# Patient Record
Sex: Male | Born: 1935 | Race: White | Hispanic: No | Smoking: Former smoker
Health system: Southern US, Community
[De-identification: ages and names within clinical notes are randomized; demographics above are authoritative.]

## PROBLEM LIST (undated history)

## (undated) DIAGNOSIS — H269 Unspecified cataract: Secondary | ICD-10-CM

## (undated) DIAGNOSIS — F0390 Unspecified dementia without behavioral disturbance: Secondary | ICD-10-CM

## (undated) DIAGNOSIS — F419 Anxiety disorder, unspecified: Secondary | ICD-10-CM

## (undated) DIAGNOSIS — K8020 Calculus of gallbladder without cholecystitis without obstruction: Secondary | ICD-10-CM

## (undated) DIAGNOSIS — I951 Orthostatic hypotension: Secondary | ICD-10-CM

## (undated) DIAGNOSIS — I2510 Atherosclerotic heart disease of native coronary artery without angina pectoris: Secondary | ICD-10-CM

## (undated) DIAGNOSIS — K219 Gastro-esophageal reflux disease without esophagitis: Secondary | ICD-10-CM

## (undated) DIAGNOSIS — I10 Essential (primary) hypertension: Secondary | ICD-10-CM

## (undated) DIAGNOSIS — K5120 Ulcerative (chronic) proctitis without complications: Secondary | ICD-10-CM

## (undated) DIAGNOSIS — R1319 Other dysphagia: Secondary | ICD-10-CM

## (undated) DIAGNOSIS — F329 Major depressive disorder, single episode, unspecified: Secondary | ICD-10-CM

## (undated) DIAGNOSIS — D696 Thrombocytopenia, unspecified: Secondary | ICD-10-CM

## (undated) DIAGNOSIS — E785 Hyperlipidemia, unspecified: Secondary | ICD-10-CM

## (undated) DIAGNOSIS — L409 Psoriasis, unspecified: Secondary | ICD-10-CM

## (undated) DIAGNOSIS — E039 Hypothyroidism, unspecified: Secondary | ICD-10-CM

## (undated) DIAGNOSIS — R1310 Dysphagia, unspecified: Secondary | ICD-10-CM

## (undated) DIAGNOSIS — N182 Chronic kidney disease, stage 2 (mild): Secondary | ICD-10-CM

## (undated) DIAGNOSIS — G3184 Mild cognitive impairment, so stated: Secondary | ICD-10-CM

## (undated) DIAGNOSIS — I219 Acute myocardial infarction, unspecified: Secondary | ICD-10-CM

## (undated) DIAGNOSIS — F32A Depression, unspecified: Secondary | ICD-10-CM

## (undated) DIAGNOSIS — M1990 Unspecified osteoarthritis, unspecified site: Secondary | ICD-10-CM

## (undated) DIAGNOSIS — D649 Anemia, unspecified: Secondary | ICD-10-CM

## (undated) HISTORY — DX: Depression, unspecified: F32.A

## (undated) HISTORY — PX: ESOPHAGEAL DILATION: SHX303

## (undated) HISTORY — PX: HERNIA REPAIR: SHX51

## (undated) HISTORY — DX: Hyperlipidemia, unspecified: E78.5

## (undated) HISTORY — DX: Anemia, unspecified: D64.9

## (undated) HISTORY — DX: Major depressive disorder, single episode, unspecified: F32.9

## (undated) HISTORY — DX: Chronic kidney disease, stage 2 (mild): N18.2

## (undated) HISTORY — DX: Mild cognitive impairment of uncertain or unknown etiology: G31.84

## (undated) HISTORY — DX: Gastro-esophageal reflux disease without esophagitis: K21.9

## (undated) HISTORY — DX: Unspecified cataract: H26.9

## (undated) HISTORY — DX: Anxiety disorder, unspecified: F41.9

## (undated) HISTORY — DX: Unspecified osteoarthritis, unspecified site: M19.90

## (undated) HISTORY — DX: Psoriasis, unspecified: L40.9

## (undated) HISTORY — DX: Acute myocardial infarction, unspecified: I21.9

## (undated) HISTORY — DX: Hypothyroidism, unspecified: E03.9

## (undated) HISTORY — DX: Thrombocytopenia, unspecified: D69.6

## (undated) HISTORY — DX: Ulcerative (chronic) proctitis without complications: K51.20

## (undated) HISTORY — PX: CLEFT LIP REPAIR: SUR1164

## (undated) HISTORY — DX: Essential (primary) hypertension: I10

## (undated) HISTORY — DX: Other dysphagia: R13.19

## (undated) HISTORY — DX: Dysphagia, unspecified: R13.10

## (undated) HISTORY — DX: Atherosclerotic heart disease of native coronary artery without angina pectoris: I25.10

## (undated) HISTORY — DX: Unspecified dementia, unspecified severity, without behavioral disturbance, psychotic disturbance, mood disturbance, and anxiety: F03.90

## (undated) HISTORY — DX: Orthostatic hypotension: I95.1

---
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
## 1977-01-11 HISTORY — PX: NECK MASS EXCISION: SHX2079

## 1996-08-11 DIAGNOSIS — I219 Acute myocardial infarction, unspecified: Secondary | ICD-10-CM

## 1996-08-11 HISTORY — DX: Acute myocardial infarction, unspecified: I21.9

## 1998-11-19 ENCOUNTER — Ambulatory Visit (HOSPITAL_COMMUNITY): Admission: RE | Admit: 1998-11-19 | Discharge: 1998-11-19 | Payer: Self-pay | Admitting: Gastroenterology

## 1998-11-19 ENCOUNTER — Encounter (INDEPENDENT_AMBULATORY_CARE_PROVIDER_SITE_OTHER): Payer: Self-pay | Admitting: Specialist

## 2002-01-11 HISTORY — PX: OTHER SURGICAL HISTORY: SHX169

## 2002-01-11 HISTORY — PX: COLECTOMY: SHX59

## 2007-06-12 HISTORY — PX: COLONOSCOPY: SHX174

## 2007-06-12 HISTORY — PX: ESOPHAGOGASTRODUODENOSCOPY: SHX1529

## 2007-06-23 LAB — HM COLONOSCOPY

## 2007-07-24 ENCOUNTER — Ambulatory Visit: Payer: Self-pay | Admitting: Oncology

## 2009-09-11 ENCOUNTER — Ambulatory Visit: Payer: Self-pay | Admitting: Cardiology

## 2009-09-11 ENCOUNTER — Encounter (INDEPENDENT_AMBULATORY_CARE_PROVIDER_SITE_OTHER): Payer: Self-pay | Admitting: *Deleted

## 2009-09-11 DIAGNOSIS — E785 Hyperlipidemia, unspecified: Secondary | ICD-10-CM | POA: Insufficient documentation

## 2009-09-11 DIAGNOSIS — K5730 Diverticulosis of large intestine without perforation or abscess without bleeding: Secondary | ICD-10-CM | POA: Insufficient documentation

## 2009-09-11 DIAGNOSIS — K222 Esophageal obstruction: Secondary | ICD-10-CM

## 2009-09-11 DIAGNOSIS — I739 Peripheral vascular disease, unspecified: Secondary | ICD-10-CM

## 2009-10-15 DIAGNOSIS — L409 Psoriasis, unspecified: Secondary | ICD-10-CM

## 2009-10-15 DIAGNOSIS — E039 Hypothyroidism, unspecified: Secondary | ICD-10-CM

## 2010-02-08 LAB — CONVERTED CEMR LAB
CO2: 31 meq/L
Calcium: 9.7 mg/dL
Chloride: 102 meq/L
Glucose, Bld: 121 mg/dL
HDL: 38 mg/dL — ABNORMAL LOW (ref >39)
LDL Cholesterol: 67 mg/dL (ref 0–99)
Platelets: 89 10*3/uL
Potassium: 4.9 meq/L
Sodium: 139 meq/L
Total CHOL/HDL Ratio: 3.1
Triglycerides: 61 mg/dL (ref <150)
VLDL: 12 mg/dL (ref 0–40)
WBC: 4.8 10*3/uL

## 2010-02-10 NOTE — Assessment & Plan Note (Signed)
Summary: ***NP6 CAD   Visit Type:  Initial Consult Primary Provider:  Dr.Halm   History of Present Illness: [name] is seen for an initial visit at the kind request of Dr. Milford [name] to assume care for known coronary artery disease.  This nice gentleman suffered an acute myocardial infarction approximately 12 years ago for which he was transported by helicopter from [facility] to [facility].  He does not recall any intervention for coronary disease at that time although he did undergo cardiac catheterization.  He had a fairly prolonged hospitalization of 7-10 days.  Records have been requested but not yet supplied by the hospitals involved.  He was subsequently followed by Dr. Alonza [name] in [place], who he has not seen for a few years.  He has no history of hypertension, hyperlipidemia or diabetes.  He has not used tobacco products except very briefly as a teenager.  Current Medications (verified): 1)  Lotensin 10 Mg Tabs (Benazepril Hcl) 2)  Synthroid 125 Mcg Tabs (Levothyroxine Sodium) .... Take 1 Tab Daily 3)  Atenolol 50 Mg Tabs (Atenolol) .... Take  1 Tab Daily 4)  Aspir-Low 81 Mg Tbec (Aspirin) .... Take 1 Tab Daily 5)  Niacin 500 Mg Tabs (Niacin) .... Take 4 Tabs Nightly 6)  Lipitor 10 Mg Tabs (Atorvastatin Calcium) .... Take 1 Tab Daily 7)  Asacol 400 Mg Tbec (Mesalamine) .... Take 1 Tab Daily 8)  Nitro-Dur 0.4 Mg/hr Pt24 (Nitroglycerin) .... Use As Directe Dfor Chestpain Not To Exceed 3 Tabs Before Going To Ed 9)  Ranitidine Hcl 150 Mg Caps (Ranitidine Hcl) .... Take 1 Tab Daily  Allergies (verified): No Known Drug Allergies  Past History:  Family History: Last updated: 09-22-09 Father died at age 65 due to neoplastic disease Mother died at age 22 following myocardial infarction Brother deceased-causes unknown  Social History: Last updated: 2009/09/22 Married with 2 adult children and lives in [place] Retired  from work with [employer] Tobacco Use -  Minimal at age 19  Alcohol Use - no Regular Exercise - no Drug Use - no  Past Medical History: ASCVD: acute myocardial infarction in 1999 treated at [facility] Hypothyroidism Anemia and thrombocytopenia Dysphasia secondary to Schatzki's ring with dilatation x2, most recently in 6/09; negative for H. 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- 2004 Colonoscopy 2009-few diverticula; normal anastomosis Appendectomy-1940 Herniorrhaphy  Family History: Father died at age 70 due to neoplastic disease Mother died at age 43 following myocardial infarction Brother deceased-causes unknown  Social History: Married with 2 adult children and lives in [place] Retired  from work with [employer] risk [employer] Tobacco Use - Minimal at age 11  Alcohol Use - no Regular Exercise - no Drug Use - no  Review of Systems       Requires corrective lenses; has been told of cataract on the right, but surgery has not been required; upper dentures; history of heart murmur as child; arthritic discomfort in the elbows.  All other systems reviewed and are negative.  Vital Signs:  Patient profile:   77 year old male Height:      71 inches Weight:      164 pounds BMI:     22.96 Pulse rate:   73 / minute BP sitting:   113 / 73  (right arm)  Vitals Entered By: Raymond Saa, CNA 09-22-09 1:13 PM)  Physical Exam  General:  Proportionate weight and height; well-developed; no acute distress: HEENT-/AT; PERRL; EOM intact; conjunctiva and lids nl:  Neck-No JVD; no carotid bruits: Endocrine-No thyromegaly: Lungs-No tachypnea, clear without rales, rhonchi or wheezes: CV-normal PMI; normal S1 and S2; modest systolic ejection murmur Abdomen-BS normal; soft and non-tender without masses or organomegaly: MS-No deformities, cyanosis or clubbing: Neurologic-Nl  cranial nerves; symmetric strength and tone: Skin- Warm, no sig. lesions: Extremities-Nl distal pulses; no edema    Impression & Recommendations:  Problem # 1:  ATHEROSCLEROTIC CARDIOVASCULAR DISEASE (ICD-429.2) Patient has been asymptomatic since a cardiac event 12 years ago.  Records will be obtained from [facility] and from the patient's cardiologist and reviewed.  I anticipate that he has preserved left ventricular systolic function and doubt that further testing will be necessary at present in the absence of symptoms.  Continued treatment with an ACE inhibitor, a beta blocker, aspirin and effective lipid-lowering therapy is appropriate.  Problem # 2:  HYPERLIPIDEMIA (ICD-272.4) Lipid profile will be obtained and therapy adjusted.  In the absence of unexpected information in his medical records, I will plan to see this patient again in one year.  Other Orders: T-Lipid Profile [phone])  Patient Instructions: 1)  Your physician recommends that you schedule a follow-up appointment in: 1 year 2)  Your physician recommends that you return for lab work in: tomorrow  EKG  Procedure date:  09/11/2009  Findings:      Normal sinus rhythm at a rate of 60 bpm Left atrial abnormality Left axis deviation Cannot exclude previous inferior myocardial infarction Cannot exclude previous anterior myocardial infarction No previous tracing for comparison.  CXR  Procedure date:  07/17/2009  Findings:      Normal heart size Clear lung fields except for the presence of coarsened interstitial changes suggestive of chronic obstructive pulmonary disease.   -  Date:  07/15/2009    BG Random: 121    BUN: 17    Creatinine: 1.02    Sodium: 139    Potassium: 4.9    Chloride: 102    CO2 Total: 31    Calcium: 9.7    WBC: 4.8    HGB: 12.2    HCT: 35.5    PLT: 89    MCV: 100.6  Appended Document: ***NP6 CAD    Clinical Lists Changes  Problems: Added new problem of HYPOTHYROIDISM  (ICD-244.9) Added new problem of PSORIASIS (ICD-696.1) Observations: Added new observation of PAST MED HX: ASCVD: acute inferior MI 1998 treated at [facility] with thrombolysis.  Cath-->high grade distal LAD;       critical lesion of OM1; mild to moderate disease in other coronaries.  GXT Echo-nl EF; distal inferoapical HK,       somewhat worse with exercise to 11 mets. Hypothyroidism Hyperlipidemia Anemia and thrombocytopenia Dysphasia secondary to Schatzki's ring with dilatation x2, most recently in 6/09; negative for H. pylori Psoriasis DJD  (10/15/2009 21:37) Added new observation of CARDIO HPI: Records oftained from [facility] in the form of a single clinic note.  Inferior MI occurred in 8/98 and was treated with thrombolytics.  Cath->high grade distal LAD; critical lesion in branch of OM1; mild to moderate disease in other coronaries.  GXT-negative (10/15/2009 21:37) Added new observation of PRIMARY MD: Dr.Halm (10/15/2009 21:37)       Primary Provider:  Dr.Halm   History of Present Illness: Records oftained from [facility] in the form of a single clinic note.  Inferior MI occurred in 8/98 and was treated with thrombolytics.  Cath->high  grade distal LAD; critical lesion in branch of OM1; mild to moderate disease in other coronaries.  GXT-negative   Past History:  Past Medical History: ASCVD: acute inferior MI 1998 treated at [facility] with thrombolysis.  Cath-->high grade distal LAD;       critical lesion of OM1; mild to moderate disease in other coronaries.  GXT Echo-nl EF; distal inferoapical HK,       somewhat worse with exercise to 11 mets. Hypothyroidism Hyperlipidemia Anemia and thrombocytopenia Dysphasia secondary to Schatzki's ring with dilatation x2, most recently in 6/09; negative for H. pylori Psoriasis DJD

## 2010-02-10 NOTE — Letter (Signed)
Summary: [facility], [facility] at [employer]  [address] [place] [address]   Phone: [phone]  Fax: [phone]     September 11, 2009 MRN: [id number]   [facility] [place] [address] [place], [place]  [address]      YOUR LAB WORK IS DUE   TOMORROW Please go to [facility], located across the street from [facility] on the second floor.  Hours are Monday - Friday 7am until 7:30pm         Saturday 8am until 12noon    _X_  DO NOT EAT OR DRINK AFTER MIDNIGHT EVENING PRIOR TO LABWORK  __ YOUR LABWORK IS NOT FASTING --YOU MAY EAT PRIOR TO LABWORK

## 2010-12-30 ENCOUNTER — Encounter: Payer: Self-pay | Admitting: Cardiology

## 2011-03-08 DIAGNOSIS — H11819 Pseudopterygium of conjunctiva, unspecified eye: Secondary | ICD-10-CM | POA: Diagnosis not present

## 2011-03-08 DIAGNOSIS — Z961 Presence of intraocular lens: Secondary | ICD-10-CM | POA: Diagnosis not present

## 2011-03-08 DIAGNOSIS — H40019 Open angle with borderline findings, low risk, unspecified eye: Secondary | ICD-10-CM | POA: Diagnosis not present

## 2011-04-08 DIAGNOSIS — E039 Hypothyroidism, unspecified: Secondary | ICD-10-CM | POA: Diagnosis not present

## 2011-04-08 DIAGNOSIS — R350 Frequency of micturition: Secondary | ICD-10-CM | POA: Diagnosis not present

## 2011-04-08 DIAGNOSIS — I10 Essential (primary) hypertension: Secondary | ICD-10-CM | POA: Diagnosis not present

## 2011-04-08 DIAGNOSIS — E785 Hyperlipidemia, unspecified: Secondary | ICD-10-CM | POA: Diagnosis not present

## 2011-06-11 DIAGNOSIS — E785 Hyperlipidemia, unspecified: Secondary | ICD-10-CM | POA: Diagnosis not present

## 2011-06-11 DIAGNOSIS — I10 Essential (primary) hypertension: Secondary | ICD-10-CM | POA: Diagnosis not present

## 2011-09-23 DIAGNOSIS — E039 Hypothyroidism, unspecified: Secondary | ICD-10-CM | POA: Diagnosis not present

## 2011-09-23 DIAGNOSIS — I10 Essential (primary) hypertension: Secondary | ICD-10-CM | POA: Diagnosis not present

## 2011-09-23 DIAGNOSIS — E785 Hyperlipidemia, unspecified: Secondary | ICD-10-CM | POA: Diagnosis not present

## 2011-09-23 DIAGNOSIS — Z23 Encounter for immunization: Secondary | ICD-10-CM | POA: Diagnosis not present

## 2011-12-01 ENCOUNTER — Ambulatory Visit: Payer: Self-pay | Admitting: Family Medicine

## 2011-12-03 ENCOUNTER — Encounter: Payer: Self-pay | Admitting: Family Medicine

## 2011-12-03 ENCOUNTER — Ambulatory Visit (INDEPENDENT_AMBULATORY_CARE_PROVIDER_SITE_OTHER): Payer: Medicare Other | Admitting: Family Medicine

## 2011-12-03 VITALS — BP 109/69 | HR 71 | Temp 98.0°F | Ht 68.5 in | Wt 161.1 lb

## 2011-12-03 DIAGNOSIS — Z7689 Persons encountering health services in other specified circumstances: Secondary | ICD-10-CM

## 2011-12-03 DIAGNOSIS — Z23 Encounter for immunization: Secondary | ICD-10-CM

## 2011-12-03 DIAGNOSIS — Z7189 Other specified counseling: Secondary | ICD-10-CM | POA: Diagnosis not present

## 2011-12-03 DIAGNOSIS — E039 Hypothyroidism, unspecified: Secondary | ICD-10-CM

## 2011-12-03 DIAGNOSIS — I219 Acute myocardial infarction, unspecified: Secondary | ICD-10-CM | POA: Insufficient documentation

## 2011-12-03 DIAGNOSIS — I2510 Atherosclerotic heart disease of native coronary artery without angina pectoris: Secondary | ICD-10-CM

## 2011-12-03 DIAGNOSIS — E785 Hyperlipidemia, unspecified: Secondary | ICD-10-CM | POA: Diagnosis not present

## 2011-12-03 MED ORDER — NIACIN ER (ANTIHYPERLIPIDEMIC) 1000 MG PO TBCR: 1000.0000 mg | EXTENDED_RELEASE_TABLET | Freq: Every day | ORAL | Status: DC

## 2011-12-03 NOTE — Assessment & Plan Note (Signed)
Asymptomatic. Continue current meds

## 2011-12-03 NOTE — Assessment & Plan Note (Signed)
Pt reports that labs were done in the last couple of months at prior PCP's office--will get these records. Plan to recheck TSH at f/u in 4 mo.

## 2011-12-03 NOTE — Progress Notes (Signed)
Office Note 12/03/2011  CC:  Chief Complaint  Patient presents with  . Establish Care    new patient    HPI:  Raymond [name] is a 78 y.o. White male who is here to establish care. Patient's most recent primary MD: Dr. Milford Cage at [facility] in [place]. Old records in EPIC/HL were reviewed prior to or during today's visit.  Here to get established, has no acute complaints or needs except he asks about whether or not he should continue niaspan.  He says he has been on it for about 2 yrs at the 2000 mg qhs dose.  Denies side effect. He is not sure if his lipid panel has improved on it.  He just says he is interested in being on fewer pills--says this one costs him the most.  Past Medical History  Diagnosis Date  . ASCVD (arteriosclerotic cardiovascular disease)   . Hypothyroidism   . Hyperlipidemia   . Anemia   . Thrombocytopenia   . Dysphasia     Secondary to Schatzki's ring with dilation x2, most recently in 06/09, negative for H. pylori  . Psoriasis   . DJD (degenerative joint disease)   . Chicken pox as a child  . Measles as a child  . Mumps   . Heart attack 08-1996    Past Surgical History  Procedure Date  . Cleft lip repair     As a child  . Neck mass excision 1979  . Colectomy 2004    Colnovesicular fistula secondary to diverticulitis requiring sigmoid colectomy and bladder repair  . Colonoscopy 2009    Few diverticula, normal anastomosis  . Appendectomy 1940  . Hernia repair   . Colovesicular fistula 2004  . Esophageal dilation 06-23-07    Family History  Problem Relation Age of Onset  . Heart disease Daughter     heart defect: d age 39  . Cancer Father     pt doesn't remember type    History   Social History  . Marital Status: Widowed    Spouse Name: N/A    Number of Children: N/A  . Years of Education: N/A   Occupational History  . Retired from [employer]    Social History Main Topics  . Smoking status: Former Smoker -- 1.0 packs/day for 10 years      Types: Cigarettes    Quit date: 01/11/1970  . Smokeless tobacco: Never Used     Comment: Minimal use at 20  . Alcohol Use: No  . Drug Use: No  . Sexually Active: Not Currently   Other Topics Concern  . Not on file   Social History Narrative   Widowed, has had girlfriend x 30 yrs, has 1 son.  Lives in [place], son lives in [place].Occupation: [employer] in [place].  Retired 52s.No regular exercise.  Smoked in the WAY distant past.Alcohol: none.      Outpatient Encounter Prescriptions as of 12/03/2011  Medication Sig Dispense Refill  . aspirin 81 MG tablet Take 81 mg by mouth daily.        Marland Kitchen atenolol (TENORMIN) 50 MG tablet Take 50 mg by mouth daily.        Marland Kitchen atorvastatin (LIPITOR) 10 MG tablet Take 10 mg by mouth daily.        . benazepril (LOTENSIN) 10 MG tablet Take 10 mg by mouth.        . levothyroxine (SYNTHROID, LEVOTHROID) 125 MCG tablet Take 125 mcg by mouth daily.        Marland Kitchen  mesalamine (ASACOL) 400 MG EC tablet Take 400 mg by mouth daily.        . ranitidine (ZANTAC) 150 MG capsule Take 150 mg by mouth 2 (two) times daily.       . [DISCONTINUED] niacin (NIASPAN) 500 MG CR tablet Take 2,000 mg by mouth at bedtime.        . niacin (NIASPAN) 1000 MG CR tablet Take 1 tablet (1,000 mg total) by mouth at bedtime.  30 tablet  6  . nitroGLYCERIN (NITRODUR - DOSED IN MG/24 HR) 0.4 mg/hr Place 1 patch onto the skin as directed.        Niaspan dose above is not accurate: he came to today's visit taking 2000 mg niaspan qhs  No Known Allergies  ROS Review of Systems  Constitutional: Negative for fever and fatigue.  HENT: Negative for congestion and sore throat.   Eyes: Negative for visual disturbance.  Respiratory: Negative for cough.   Cardiovascular: Negative for chest pain.  Gastrointestinal: Negative for nausea and abdominal pain.  Genitourinary: Negative for dysuria.  Musculoskeletal: Negative for back pain and joint swelling.  Skin: Negative for rash.   Neurological: Negative for weakness and headaches.  Hematological: Negative for adenopathy.    PE; Blood pressure 109/69, pulse 71, temperature 98 F (36.7 C), temperature source Temporal, height 5' 8.5" (1.74 m), weight 161 lb 1.9 oz (73.084 kg), SpO2 95.00%. Gen: Alert, well appearing.  Patient is oriented to person, place, time, and situation. AFFECT: pleasant, lucid thought and speech. ENT: Ears: EACs clear, normal epithelium.  TMs with good light reflex and landmarks bilaterally.  Eyes: no injection, icteris, swelling, or exudate.  EOMI, PERRLA. Nose: no drainage or turbinate edema/swelling.  No injection or focal lesion.  Mouth: lips without lesion/swelling.  Oral mucosa pink and moist.  Dentition intact and without obvious caries or gingival swelling.  Oropharynx without erythema, exudate, or swelling.  Neck - No masses or thyromegaly or limitation in range of motion CV: RRR, no m/r/g.   LUNGS: CTA bilat, nonlabored resps, good aeration in all lung fields. ABD: soft, NT, ND, BS normal.  No hepatospenomegaly or mass.  No bruits. EXT: 2+ LE edema, with chronic venous stasis skin changes  Pertinent labs:  None today  ASSESSMENT AND PLAN:   New Pt, here to establish care.  Reviewed EMR records.  Will request Dr. Webb Laws records.  HYPOTHYROIDISM Pt reports that labs were done in the last couple of months at prior PCP's office--will get these records. Plan to recheck TSH at f/u in 4 mo.  HYPERLIPIDEMIA Continue lipitor, but will go ahead and cut back his niaspan to the 1000mg  qhs dosing, new rx and some samples of the 500mg  niaspan given today.   Plan to recheck lipids at next f/u in 45mo.  ATHEROSCLEROTIC CARDIOVASCULAR DISEASE Asymptomatic. Continue current meds.   An After Visit Summary was printed and given to the patient.  Return in about 4 months (around 04/01/2012) for f/u hyperlipidemia and hypothyroid and CAD.

## 2011-12-03 NOTE — Assessment & Plan Note (Signed)
Continue lipitor, but will go ahead and cut back his niaspan to the 1000mg  qhs dosing, new rx and some samples of the 500mg  niaspan given today.   Plan to recheck lipids at next f/u in 78mo.

## 2012-03-21 ENCOUNTER — Telehealth: Payer: Self-pay | Admitting: *Deleted

## 2012-03-21 NOTE — Telephone Encounter (Signed)
Faxed request received for prior auth for DELZICOL 400 MG, 2 CAP BID From will be faxed to office.

## 2012-03-22 NOTE — Telephone Encounter (Signed)
Form completed and put on MD desk to sign.

## 2012-03-27 ENCOUNTER — Telehealth: Payer: Self-pay | Admitting: Family Medicine

## 2012-03-27 NOTE — Telephone Encounter (Signed)
Per Dr. Milinda Cave, hold form until pt is seen.  He is due for follow up (advised in separate phone note) and will discuss this med at that appt.

## 2012-03-27 NOTE — Telephone Encounter (Signed)
Unable to reach patient at his home.  PC to son's home, spoke with daughter-in-law who will pass along the message that pt is due for appt.  They will call back to schedule.

## 2012-03-27 NOTE — Telephone Encounter (Signed)
Pls call pt and have him arrange o/v to discuss ongoing use of his delzicol (mesalamine)--I have some questions for him about how he got on this med in the first place, etc: I last saw him 11/2011 and wanted him to f/u in 73mo, so he is due anyway.  Even if I choose to continue him on the med, he is due for routine blood monitoring for being on this med (CBC, CMET).-thx

## 2012-03-27 NOTE — Telephone Encounter (Signed)
Patient called back & scheduled OV 04/06/12. Will come in fasting for bloodwork.

## 2012-04-06 ENCOUNTER — Ambulatory Visit (INDEPENDENT_AMBULATORY_CARE_PROVIDER_SITE_OTHER): Payer: Medicare Other | Admitting: Family Medicine

## 2012-04-06 ENCOUNTER — Encounter: Payer: Self-pay | Admitting: Family Medicine

## 2012-04-06 VITALS — BP 118/77 | HR 67 | Temp 98.0°F | Resp 12 | Wt 160.8 lb

## 2012-04-06 DIAGNOSIS — K5730 Diverticulosis of large intestine without perforation or abscess without bleeding: Secondary | ICD-10-CM | POA: Diagnosis not present

## 2012-04-06 DIAGNOSIS — E785 Hyperlipidemia, unspecified: Secondary | ICD-10-CM

## 2012-04-06 DIAGNOSIS — I10 Essential (primary) hypertension: Secondary | ICD-10-CM | POA: Diagnosis not present

## 2012-04-06 DIAGNOSIS — E039 Hypothyroidism, unspecified: Secondary | ICD-10-CM | POA: Diagnosis not present

## 2012-04-06 LAB — COMPREHENSIVE METABOLIC PANEL
ALT: 17 U/L (ref 0–53)
AST: 22 U/L (ref 0–37)
Albumin: 4.1 g/dL (ref 3.5–5.2)
Alkaline Phosphatase: 77 U/L (ref 39–117)
Calcium: 9.4 mg/dL (ref 8.4–10.5)
Chloride: 100 mEq/L (ref 96–112)
Potassium: 4.1 mEq/L (ref 3.5–5.1)
Sodium: 136 mEq/L (ref 135–145)

## 2012-04-06 LAB — TSH: TSH: 0.27 u[IU]/mL — ABNORMAL LOW (ref 0.35–5.50)

## 2012-04-06 LAB — LIPID PANEL
HDL: 36.9 mg/dL — ABNORMAL LOW (ref >39.00)
LDL Cholesterol: 64 mg/dL (ref 0–99)
Total CHOL/HDL Ratio: 3
Triglycerides: 46 mg/dL (ref 0.0–149.0)

## 2012-04-06 NOTE — Progress Notes (Signed)
OFFICE NOTE  04/08/2012  CC:  Chief Complaint  Patient presents with  . Follow-up    4 months     HPI: Patient is a 79 y.o. Caucasian male who is here for 4 mo f/u hyperlipidemia, hypertension, hypothyroidism. Denies complaint. Compliant with meds. Wife helps him some, notes gradually worsening short term memory problems. He eats a fairly heart healthy diet.  No exercise.  Ros: no CP, no SOB, no palpitations, no dizziness, no fatigue.  No tremor.   Pertinent PMH:  Past Medical History  Diagnosis Date  . ASCVD (arteriosclerotic cardiovascular disease)   . Hypothyroidism   . Hyperlipidemia   . Anemia   . Thrombocytopenia     Platelets 99K on 09/2009 labs  . Dysphasia     Secondary to Schatzki's ring with dilation x2, most recently in 06/09, negative for H. pylori  . Psoriasis   . DJD (degenerative joint disease)   . Myocardial infarction 08-1996    Cardiac cath done but no other intervention that we know of  . Mild cognitive impairment with memory loss    Past Surgical History  Procedure Laterality Date  . Cleft lip repair      As a child  . Neck mass excision  1979  . 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 Colonoscopy  2009    Few diverticula, normal anastomosis  . Appendectomy  1940  . Hernia repair    . Colovesicular fistula  2004  . Esophageal dilation  06-23-07    H pylori NEG    MEDS:  Outpatient Prescriptions Prior to Visit  Medication Sig Dispense Refill  . aspirin 81 MG tablet Take 81 mg by mouth daily.        Marland Kitchen atenolol (TENORMIN) 50 MG tablet Take 50 mg by mouth daily.        Marland Kitchen atorvastatin (LIPITOR) 10 MG tablet Take 10 mg by mouth daily.        . benazepril (LOTENSIN) 10 MG tablet Take 10 mg by mouth.        . mesalamine (ASACOL) 400 MG EC tablet Take 400 mg by mouth daily.        . niacin (NIASPAN) 1000 MG CR tablet Take 1 tablet (1,000 mg total) by mouth at  bedtime.  30 tablet  6  . nitroGLYCERIN (NITRODUR - DOSED IN MG/24 HR) 0.4 mg/hr Place 1 patch onto the skin as directed.        . ranitidine (ZANTAC) 150 MG capsule Take 150 mg by mouth 2 (two) times daily.       Marland Kitchen levothyroxine (SYNTHROID, LEVOTHROID) 125 MCG tablet Take 125 mcg by mouth daily.         No facility-administered medications prior to visit.    PE: Blood pressure 118/77, pulse 67, temperature 98 F (36.7 C), temperature source Temporal, resp. rate 12, weight 160 lb 12 oz (72.916 kg), SpO2 97.00%. Gen: Alert, well appearing.  Patient is oriented to person, place, time, and situation. AFFECT: pleasant.  Displays lucid thought and speech.  He does repeat a question on a few occasions throughout the visit. CV: RRR, no m/r/g.   LUNGS: CTA bilat, nonlabored resps, good aeration in all lung fields. ABD: soft, NT/ND EXT: no clubbing, cyanosis, or edema.   IMPRESSION AND PLAN:  HYPERLIPIDEMIA On statin and niaspan. Check FLP today.  HYPOTHYROIDISM Continue 125 mcg qd levothyroxine and check TSH today.  DIVERTICULAR  DISEASE Pt on asachol 400mg  2 tabs bid---has been on this for >10 yrs per his/wife's report, initially started on it by GI MD at [facility] (Dr. Gwinda Passe and Dr. Byrd Hesselbach) but for unclear reasons (presumably for chronic diverticular dz ?). Will continue asachol but will cut back to 2 tabs qhs.  Try to obtain old records.  Unspecified essential hypertension Stable.  Continue atenolol and lotensin.   An After Visit Summary was printed and given to the patient.  FOLLOW UP: 4-32mo

## 2012-04-07 ENCOUNTER — Telehealth: Payer: Self-pay | Admitting: *Deleted

## 2012-04-07 DIAGNOSIS — E039 Hypothyroidism, unspecified: Secondary | ICD-10-CM

## 2012-04-07 MED ORDER — LEVOTHYROXINE SODIUM 112 MCG PO TABS: 112.0000 ug | ORAL_TABLET | Freq: Every day | ORAL | Status: DC

## 2012-04-07 NOTE — Telephone Encounter (Signed)
Message copied by Regis Bill on Fri Apr 07, 2012  4:53 PM ------      Message from: Jeoffrey Massed      Created: Fri Apr 07, 2012  6:50 AM       Pls notify pt that all labs were great yesterday EXCEPT his thyroid hormone level was a tiny bit high.  I want him to decrease his dose to 112 mcg tab.      Pls do new eRx for 112 mcg levothyroxine to his pharmacy, 1 tab po qd, #30, RF x 1.  Needs recheck TSH (lab visit only--this can be at [facility] in [place] if necessary for convenience) in 6-8 wks.--thx ------

## 2012-04-07 NOTE — Telephone Encounter (Signed)
Patient informed, understood & agreed; new Rx to pharmacy, future lab order placed/SLS

## 2012-04-08 ENCOUNTER — Encounter: Payer: Self-pay | Admitting: Family Medicine

## 2012-04-08 DIAGNOSIS — I10 Essential (primary) hypertension: Secondary | ICD-10-CM | POA: Insufficient documentation

## 2012-04-08 NOTE — Assessment & Plan Note (Signed)
Stable.  Continue atenolol and lotensin.

## 2012-04-08 NOTE — Assessment & Plan Note (Signed)
On statin and niaspan. Check FLP today.

## 2012-04-08 NOTE — Assessment & Plan Note (Signed)
Pt on asachol 400mg  2 tabs bid---has been on this for >10 yrs per his/wife's report, initially started on it by GI MD at [facility] (Dr. Gwinda Passe and Dr. Byrd Hesselbach) but for unclear reasons (presumably for chronic diverticular dz ?). Will continue asachol but will cut back to 2 tabs qhs.  Try to obtain old records.

## 2012-04-08 NOTE — Assessment & Plan Note (Signed)
Continue 125 mcg qd levothyroxine and check TSH today.

## 2012-04-10 ENCOUNTER — Encounter: Payer: Self-pay | Admitting: Family Medicine

## 2012-04-10 DIAGNOSIS — H40019 Open angle with borderline findings, low risk, unspecified eye: Secondary | ICD-10-CM | POA: Diagnosis not present

## 2012-04-11 ENCOUNTER — Telehealth: Payer: Self-pay | Admitting: *Deleted

## 2012-04-11 NOTE — Telephone Encounter (Signed)
Raymond [name] at pharmacy notified.

## 2012-04-11 NOTE — Telephone Encounter (Signed)
OK to switch manufacturers.-thx

## 2012-04-11 NOTE — Telephone Encounter (Signed)
Fax and phone call from pharmacy.  They have switched manufacturers for this patient's Levothyroxine.  Previously they used Mylan.  They are now using [employer]. Pharmacy would like to know if this switch is OK.  I do not see any previous notes to only use Mylan-made medication.

## 2012-04-21 ENCOUNTER — Other Ambulatory Visit: Payer: Self-pay | Admitting: Family Medicine

## 2012-04-21 MED ORDER — MESALAMINE ER 0.375 G PO CP24: ORAL_CAPSULE | ORAL | Status: DC

## 2012-04-24 ENCOUNTER — Encounter: Payer: Self-pay | Admitting: Family Medicine

## 2012-04-24 ENCOUNTER — Telehealth: Payer: Self-pay | Admitting: *Deleted

## 2012-04-24 NOTE — Telephone Encounter (Signed)
Truddie Hidden calls with question regarding new mesalamine RX.  Pt insurance will not pay for Delzicol.  Pt was switched to Apriso per Dr. Milinda Cave.  Advised OK to finish Delzicol before starting Apriso.  Truddie Hidden states that patient has difficulty swallowing.  Jasmine Pang pt has to try to swallow this med.  If pt is unable to swallow pill, she should call back and we can call insurance again to see if they will approve Delzicol due to pt not being able to swallow Apriso. Pt also states Dr. Milinda Cave mentioned at last visit that his "eyes looked funny".  Truddie Hidden was with patient and does not recall that being said.  No mention of any issues with eyes in last OV note.  Truddie Hidden believes this may be memory issue for patient.  Jasmine Pang there are no pending referrals to evaluate eyes.  She is agreeable and will let patient know.

## 2012-05-29 NOTE — Telephone Encounter (Signed)
Spoke w/provider RE; medication management, will begin prior authorization process for Delzicol; caller informed & states [employer] is pt's Rx benefit carrier/SLS

## 2012-05-29 NOTE — Telephone Encounter (Signed)
Truddie Hidden called back and stated patient choked & could not get the Apriso down. Please advise what patient should do.

## 2012-06-06 NOTE — Telephone Encounter (Signed)
Please contact Lu. She is checking to see if the Delzicol has been approved by the insurance co. She is also going to check with the pharmacy to see if they have heard anything from the insurance co.

## 2012-06-07 NOTE — Telephone Encounter (Signed)
Called Raymond [name] to let her know you would be calling her this afternoon. Raymond [name] said that she was going out and she try to be back around 4pm.

## 2012-06-07 NOTE — Telephone Encounter (Addendum)
Spoke w/Ms [facility] & informed that provider would like to have patient referred to Gastroenterology for A&E and to have GI decide if they believe that pt should continue taking mesalamine Rx; Ok to continue taking Rx until either finishes supply on hand and/or is evaluated by GI. Caller understood & agreed/SLS Please make referral for GI in [place] per pt request.

## 2012-06-07 NOTE — Telephone Encounter (Signed)
Pls contact Lu and tell her I'll call her after clinic is finished this afternoon.-thx

## 2012-06-07 NOTE — Telephone Encounter (Addendum)
Lu called to find out what was happening with patients medication. Patient is almost out of the medication. Lu says to call her and let her know what's going on. Patient also wants a referral a gastroenterologist to have his esophagus stretched. Please advise?

## 2012-06-08 ENCOUNTER — Other Ambulatory Visit: Payer: Self-pay | Admitting: Family Medicine

## 2012-06-08 DIAGNOSIS — K222 Esophageal obstruction: Secondary | ICD-10-CM

## 2012-06-08 DIAGNOSIS — K5790 Diverticulosis of intestine, part unspecified, without perforation or abscess without bleeding: Secondary | ICD-10-CM

## 2012-06-08 NOTE — Telephone Encounter (Signed)
Referral ordered

## 2012-06-12 ENCOUNTER — Telehealth: Payer: Self-pay | Admitting: Family Medicine

## 2012-06-12 DIAGNOSIS — E039 Hypothyroidism, unspecified: Secondary | ICD-10-CM

## 2012-06-12 MED ORDER — LEVOTHYROXINE SODIUM 112 MCG PO TABS: 112.0000 ug | ORAL_TABLET | Freq: Every day | ORAL | Status: DC

## 2012-06-12 NOTE — Telephone Encounter (Signed)
PER Note in Comment Box of VISIT INFO: Created by Carmelia Bake on 06/12/2012 04:46 PM Rx request to pharmacy #[id number]

## 2012-06-23 ENCOUNTER — Ambulatory Visit (INDEPENDENT_AMBULATORY_CARE_PROVIDER_SITE_OTHER): Payer: Medicare Other | Admitting: Gastroenterology

## 2012-06-23 ENCOUNTER — Encounter: Payer: Self-pay | Admitting: Gastroenterology

## 2012-06-23 VITALS — BP 94/61 | HR 87 | Temp 97.4°F | Ht 71.0 in | Wt 161.2 lb

## 2012-06-23 DIAGNOSIS — K5289 Other specified noninfective gastroenteritis and colitis: Secondary | ICD-10-CM

## 2012-06-23 DIAGNOSIS — R1314 Dysphagia, pharyngoesophageal phase: Secondary | ICD-10-CM | POA: Diagnosis not present

## 2012-06-23 DIAGNOSIS — R1310 Dysphagia, unspecified: Secondary | ICD-10-CM

## 2012-06-23 NOTE — Assessment & Plan Note (Signed)
79 year old gentleman with history of esophageal stricture presents for recurrent dysphagia. He reports his last upper endoscopy with dilation was by Dr. Achilles Dunk in 2009. We have requested records. He has had his esophagus stretched twice. Suspect recurrent stricture. Really denies any significant heartburn. He is on H2 blocker only. Recommend upper endoscopy with dilation in the near future with Dr. Jena Gauss.  I have discussed the risks, alternatives, benefits with regards to but not limited to the risk of reaction to medication, bleeding, infection, perforation and the patient is agreeable to proceed. Written consent to be obtained.

## 2012-06-23 NOTE — Progress Notes (Signed)
Primary Care Physician:  MCGOWEN,PHILIP H, MD  Primary Gastroenterologist:  Michael Rourk, MD   Chief Complaint  Patient presents with  . Dysphagia    HPI:  Raymond [name] is a 79 y.o. male here at the request of Dr. McGowen for further evaluation of recurrent esophageal dysphagia and to determine the need for ongoing mesalamine therapy.  Patient presents with his long-time significant other who provides most of the history given patient's mild dementia. She meant to bring his records of previous procedures but actually brought her records instead.  She reports his last TCS/EGD 2009 at [facility] by Dr. Gilliam. Had esophageal dilation. He has had esophageal dilation twice now. I was able to find a pathology report from 2000, with biopsy consistent with IBD favoring ulcerative colitis. Actual colonoscopy report not available however. Patient also has a history of colovesicular fistula repair about 10 years ago. He apparently been on mesalamine at least for a period of time. Was on Asacol but new insurance will not pay for it. Then put on Delsicol. Insurance would not pay for it either. Apriso then prescribed but the pills are larger and became lodged in his esophagus therefore he cannot take this right now. Patient and significant other have very little insight as far as to why he is taken this type of medication. Both of them thought it was for his history of fistula and diverticulitis.   Difficulty swallowing will vomit at times. Washes the food down. Difficult to take large pills. No heartburn. No abdominal pain. Bm ok. No diarrhea except when eats [place]. No melena, brbpr. No weight loss.   Current Outpatient Prescriptions  Medication Sig Dispense Refill  . aspirin 81 MG tablet Take 81 mg by mouth daily.        . atenolol (TENORMIN) 50 MG tablet Take 50 mg by mouth daily.        . atorvastatin (LIPITOR) 10 MG tablet Take 10 mg by mouth daily.        . benazepril (LOTENSIN) 10 MG tablet Take 10  mg by mouth.        . levothyroxine (SYNTHROID, LEVOTHROID) 112 MCG tablet Take 125 mcg by mouth daily.      . mesalamine (APRISO) 0.375 G 24 hr capsule 4 caps po qd  120 capsule  6  . niacin (NIASPAN) 1000 MG CR tablet Take 1 tablet (1,000 mg total) by mouth at bedtime.  30 tablet  6  . nitroGLYCERIN (NITRODUR - DOSED IN MG/24 HR) 0.4 mg/hr Place 1 patch onto the skin as directed.        . ranitidine (ZANTAC) 150 MG capsule Take 150 mg by mouth 2 (two) times daily.        No current facility-administered medications for this visit.    Allergies as of 06/23/2012  . (No Known Allergies)    Past Medical History  Diagnosis Date  . ASCVD (arteriosclerotic cardiovascular disease)     2 MI's in 1990s  . Hypothyroidism   . Hyperlipidemia   . Anemia   . Thrombocytopenia     Platelets 99K on 09/2009 labs  . Dysphasia     Secondary to Schatzki's ring with dilation x2, most recently in 06/09, negative for H. pylori  . Psoriasis   . DJD (degenerative joint disease)   . Myocardial infarction 08-1996    Cardiac cath done but no other intervention that we know of  . Mild cognitive impairment with memory loss   . HTN (hypertension)       Past Surgical History  Procedure Laterality Date  . Cleft lip repair      As a child  . Neck mass excision  1979  . 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 Colonoscopy  2009    Few diverticula, normal anastomosis  . Appendectomy  1940  . Hernia repair    . Colovesicular fistula  2004  . Esophageal dilation  06-23-07    H pylori NEG    Family History  Problem Relation Age of Onset  . Heart disease Daughter     heart defect: d age 19  . Cancer Father     pt doesn't remember type    History   Social History  . Marital Status: Widowed    Spouse Name: N/A    Number of Children: N/A  . Years of Education: N/A   Occupational History  . Retired from [employer]     Social History Main Topics  . Smoking status: Former Smoker -- 1.00 packs/day for 10 years    Types: Cigarettes    Quit date: 01/11/1970  . Smokeless tobacco: Never Used     Comment: Minimal use at 20  . Alcohol Use: No  . Drug Use: No  . Sexually Active: Not Currently   Other Topics Concern  . Not on file   Social History Narrative   Widowed, has had girlfriend x 30 yrs, has 1 son.  Lives in [place], son lives in [place].   Occupation: [employer] in [place].  Retired 1990s.   No regular exercise.  Smoked in the WAY distant past.   Alcohol: none.        ROS:  General: Negative for anorexia, weight loss, fever, chills, fatigue, weakness. Eyes: Negative for vision changes.  ENT: Negative for hoarseness, nasal congestion. CV: Negative for chest pain, angina, palpitations, dyspnea on exertion, peripheral edema.  Respiratory: Negative for dyspnea at rest, dyspnea on exertion, cough, sputum, wheezing.  GI: See history of present illness. GU:  Negative for dysuria, hematuria, urinary incontinence, urinary frequency, nocturnal urination.  MS: Negative for joint pain, low back pain.  Derm: Negative for rash or itching.  Neuro: Negative for weakness, abnormal sensation, seizure, frequent headaches, confusion. +memory loss Psych: Negative for anxiety, depression, suicidal ideation, hallucinations.  Endo: Negative for unusual weight change.  Heme: Negative for bruising or bleeding. Allergy: Negative for rash or hives.    Physical Examination:  BP 94/61  Pulse 87  Temp(Src) 97.4 F (36.3 C) (Oral)  Ht 5' 11" (1.803 m)  Wt 161 lb 3.2 oz (73.12 kg)  BMI 22.49 kg/m2   General: Well-nourished, well-developed in no acute distress. Somewhat hard of hearing. Accompanied by significant other.  Head: Normocephalic, atraumatic.   Eyes: Conjunctiva pink, no icterus. Mouth: Oropharyngeal mucosa moist and pink , no lesions erythema or exudate. Neck: Supple without thyromegaly,  masses, or lymphadenopathy.  Lungs: Clear to auscultation bilaterally.  Heart: Regular rate and rhythm, no murmurs rubs or gallops.  Abdomen: Bowel sounds are normal, nontender, nondistended, no hepatosplenomegaly or masses, no abdominal bruits or    hernia , no rebound or guarding.   Rectal: not performed Extremities: No lower extremity edema. No clubbing or deformities.  Neuro: Alert and oriented x 4 , grossly normal neurologically.  Skin: Warm and dry, no rash or jaundice.   Psych: Alert and cooperative, normal mood and affect.  Labs: Lab Results  Component Value Date     ALT 17 04/06/2012   AST 22 04/06/2012   ALKPHOS 77 04/06/2012   BILITOT 0.8 04/06/2012   Lab Results  Component Value Date   CREATININE 1.1 04/06/2012   BUN 19 04/06/2012   NA 136 04/06/2012   K 4.1 04/06/2012   CL 100 04/06/2012   CO2 30 04/06/2012     Imaging Studies: No results found.    

## 2012-06-23 NOTE — Patient Instructions (Addendum)
1. We have scheduled you for an upper endoscopy with esophageal dilation with Dr. Jena Gauss. Please see separate instructions. 2. Please be careful to chew your food thoroughly and use plenty of fluid wash it down. You need to consume soft foods only until the endoscopy. Make sure use it upright for 30 minutes after taking medications. 3. : Request copy of old records from Dr. Gwinda Passe for review. We will make a determination about mesalamine therapy.

## 2012-06-23 NOTE — Assessment & Plan Note (Signed)
Patient has been on mesalamine for years and is unclear indication. As outlined above, biopsy in 2000 suggested inflammatory bowel disease. He has had colovesicular fistula repair in 2004,? Dr. Durenda Hurt. We have requested records for further review so that we can determine if ongoing mesalamine therapy is needed. Further recommendations to follow. Given that the patient is not able to swallow the Apriso tablets, he will finish up mesalamine he has at home and then stop until we can determine if needed.

## 2012-06-26 ENCOUNTER — Encounter (HOSPITAL_COMMUNITY): Payer: Self-pay | Admitting: Pharmacy Technician

## 2012-06-26 NOTE — Progress Notes (Signed)
Cc PCP 

## 2012-06-28 ENCOUNTER — Ambulatory Visit (HOSPITAL_COMMUNITY)
Admission: RE | Admit: 2012-06-28 | Discharge: 2012-06-28 | Disposition: A | Discharge status: Home / Self Care | Payer: Medicare Other | Source: Ambulatory Visit | Attending: Internal Medicine | Admitting: Internal Medicine

## 2012-06-28 ENCOUNTER — Encounter (HOSPITAL_COMMUNITY)
Admission: RE | Disposition: A | Discharge status: Home / Self Care | Payer: Self-pay | Source: Ambulatory Visit | Attending: Internal Medicine

## 2012-06-28 ENCOUNTER — Encounter (HOSPITAL_COMMUNITY): Payer: Self-pay | Admitting: *Deleted

## 2012-06-28 DIAGNOSIS — K222 Esophageal obstruction: Secondary | ICD-10-CM

## 2012-06-28 DIAGNOSIS — K2960 Other gastritis without bleeding: Secondary | ICD-10-CM | POA: Diagnosis not present

## 2012-06-28 DIAGNOSIS — Q391 Atresia of esophagus with tracheo-esophageal fistula: Secondary | ICD-10-CM | POA: Diagnosis not present

## 2012-06-28 DIAGNOSIS — E785 Hyperlipidemia, unspecified: Secondary | ICD-10-CM | POA: Diagnosis not present

## 2012-06-28 DIAGNOSIS — K449 Diaphragmatic hernia without obstruction or gangrene: Secondary | ICD-10-CM

## 2012-06-28 DIAGNOSIS — K319 Disease of stomach and duodenum, unspecified: Secondary | ICD-10-CM | POA: Insufficient documentation

## 2012-06-28 DIAGNOSIS — R1310 Dysphagia, unspecified: Secondary | ICD-10-CM | POA: Diagnosis not present

## 2012-06-28 DIAGNOSIS — I10 Essential (primary) hypertension: Secondary | ICD-10-CM | POA: Diagnosis not present

## 2012-06-28 HISTORY — PX: ESOPHAGOGASTRODUODENOSCOPY (EGD) WITH ESOPHAGEAL DILATION: SHX5812

## 2012-06-28 SURGERY — ESOPHAGOGASTRODUODENOSCOPY (EGD) WITH ESOPHAGEAL DILATION
Anesthesia: Moderate Sedation

## 2012-06-28 MED ORDER — ONDANSETRON HCL 4 MG/2ML IJ SOLN
INTRAMUSCULAR | Status: AC
  Filled 2012-06-28: qty 2

## 2012-06-28 MED ORDER — MEPERIDINE HCL 100 MG/ML IJ SOLN
INTRAMUSCULAR | Status: DC | PRN
  Administered 2012-06-28 (×2): 25 mg via INTRAVENOUS

## 2012-06-28 MED ORDER — SODIUM CHLORIDE 0.9 % IV SOLN
INTRAVENOUS | Status: DC
  Administered 2012-06-28: 1000 mL via INTRAVENOUS

## 2012-06-28 MED ORDER — BUTAMBEN-TETRACAINE-BENZOCAINE 2-2-14 % EX AERO
INHALATION_SPRAY | CUTANEOUS | Status: DC | PRN
  Administered 2012-06-28: 2 via TOPICAL

## 2012-06-28 MED ORDER — MIDAZOLAM HCL 5 MG/5ML IJ SOLN
INTRAMUSCULAR | Status: AC
  Filled 2012-06-28: qty 10

## 2012-06-28 MED ORDER — MIDAZOLAM HCL 5 MG/5ML IJ SOLN
INTRAMUSCULAR | Status: DC | PRN
  Administered 2012-06-28 (×3): 1 mg via INTRAVENOUS

## 2012-06-28 MED ORDER — MEPERIDINE HCL 100 MG/ML IJ SOLN
INTRAMUSCULAR | Status: AC
  Filled 2012-06-28: qty 2

## 2012-06-28 MED ORDER — ONDANSETRON HCL 4 MG/2ML IJ SOLN
INTRAMUSCULAR | Status: DC | PRN
  Administered 2012-06-28: 4 mg via INTRAVENOUS

## 2012-06-28 MED ORDER — STERILE WATER FOR IRRIGATION IR SOLN
Status: DC | PRN
  Administered 2012-06-28: 09:00:00

## 2012-06-28 NOTE — Interval H&P Note (Signed)
History and Physical Interval Note:  06/28/2012 9:09 AM  Raymond [name]  has presented today for surgery, with the diagnosis of Esophageal Disphagia  The various methods of treatment have been discussed with the patient and family. After consideration of risks, benefits and other options for treatment, the patient has consented to  Procedure(s) with comments: ESOPHAGOGASTRODUODENOSCOPY (EGD) WITH ESOPHAGEAL DILATION (N/A) - 2:00-moved to [address] notified pt as a surgical intervention .  The patient's history has been reviewed, patient examined, no change in status, stable for surgery.  I have reviewed the patient's chart and labs.  Questions were answered to the patient's satisfaction.     [name]  EGD with esophageal dilation as appropriate. The risks, benefits, limitations, alternatives and imponderables have been reviewed with the patient. Potential for esophageal dilation, biopsy, etc. have also been reviewed.  Questions have been answered. All parties agreeable.

## 2012-06-28 NOTE — H&P (View-Only) (Signed)
Primary Care Physician:  Jeoffrey Massed, MD  Primary Gastroenterologist:  Roetta Sessions, MD   Chief Complaint  Patient presents with  . Dysphagia    HPI:  Raymond [name] is a 79 y.o. male here at the request of Dr. Milinda Cave for further evaluation of recurrent esophageal dysphagia and to determine the need for ongoing mesalamine therapy.  Patient presents with his long-time significant other who provides most of the history given patient's mild dementia. She meant to bring his records of previous procedures but actually brought her records instead.  She reports his last TCS/EGD 2009 at [facility] by Dr. Gwinda Passe. Had esophageal dilation. He has had esophageal dilation twice now. I was able to find a pathology report from 2000, with biopsy consistent with IBD favoring ulcerative colitis. Actual colonoscopy report not available however. Patient also has a history of colovesicular fistula repair about 10 years ago. He apparently been on mesalamine at least for a period of time. Was on Asacol but new insurance will not pay for it. Then put on Delsicol. Insurance would not pay for it either. Apriso then prescribed but the pills are larger and became lodged in his esophagus therefore he cannot take this right now. Patient and significant other have very little insight as far as to why he is taken this type of medication. Both of them thought it was for his history of fistula and diverticulitis.   Difficulty swallowing will vomit at times. Washes the food down. Difficult to take large pills. No heartburn. No abdominal pain. Bm ok. No diarrhea except when eats [place]. No melena, brbpr. No weight loss.   Current Outpatient Prescriptions  Medication Sig Dispense Refill  . aspirin 81 MG tablet Take 81 mg by mouth daily.        Marland Kitchen atenolol (TENORMIN) 50 MG tablet Take 50 mg by mouth daily.        Marland Kitchen atorvastatin (LIPITOR) 10 MG tablet Take 10 mg by mouth daily.        . benazepril (LOTENSIN) 10 MG tablet Take 10  mg by mouth.        . levothyroxine (SYNTHROID, LEVOTHROID) 112 MCG tablet Take 125 mcg by mouth daily.      . mesalamine (APRISO) 0.375 G 24 hr capsule 4 caps po qd  120 capsule  6  . niacin (NIASPAN) 1000 MG CR tablet Take 1 tablet (1,000 mg total) by mouth at bedtime.  30 tablet  6  . nitroGLYCERIN (NITRODUR - DOSED IN MG/24 HR) 0.4 mg/hr Place 1 patch onto the skin as directed.        . ranitidine (ZANTAC) 150 MG capsule Take 150 mg by mouth 2 (two) times daily.        No current facility-administered medications for this visit.    Allergies as of 06/23/2012  . (No Known Allergies)    Past Medical History  Diagnosis Date  . ASCVD (arteriosclerotic cardiovascular disease)     2 MI's in 1990s  . Hypothyroidism   . Hyperlipidemia   . Anemia   . Thrombocytopenia     Platelets 99K on 09/2009 labs  . Dysphasia     Secondary to Schatzki's ring with dilation x2, most recently in 06/09, negative for H. pylori  . Psoriasis   . DJD (degenerative joint disease)   . Myocardial infarction 08-1996    Cardiac cath done but no other intervention that we know of  . Mild cognitive impairment with memory loss   . HTN (hypertension)  Past Surgical History  Procedure Laterality Date  . Cleft lip repair      As a child  . Neck mass excision  1979  . 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 Colonoscopy  2009    Few diverticula, normal anastomosis  . Appendectomy  1940  . Hernia repair    . Colovesicular fistula  2004  . Esophageal dilation  06-23-07    H pylori NEG    Family History  Problem Relation Age of Onset  . Heart disease Daughter     heart defect: d age 68  . Cancer Father     pt doesn't remember type    History   Social History  . Marital Status: Widowed    Spouse Name: N/A    Number of Children: N/A  . Years of Education: N/A   Occupational History  . Retired from [employer]     Social History Main Topics  . Smoking status: Former Smoker -- 1.00 packs/day for 10 years    Types: Cigarettes    Quit date: 01/11/1970  . Smokeless tobacco: Never Used     Comment: Minimal use at 20  . Alcohol Use: No  . Drug Use: No  . Sexually Active: Not Currently   Other Topics Concern  . Not on file   Social History Narrative   Widowed, has had girlfriend x 30 yrs, has 1 son.  Lives in [place], son lives in [place].   Occupation: [employer] in [place].  Retired 28s.   No regular exercise.  Smoked in the WAY distant past.   Alcohol: none.        ROS:  General: Negative for anorexia, weight loss, fever, chills, fatigue, weakness. Eyes: Negative for vision changes.  ENT: Negative for hoarseness, nasal congestion. CV: Negative for chest pain, angina, palpitations, dyspnea on exertion, peripheral edema.  Respiratory: Negative for dyspnea at rest, dyspnea on exertion, cough, sputum, wheezing.  GI: See history of present illness. GU:  Negative for dysuria, hematuria, urinary incontinence, urinary frequency, nocturnal urination.  MS: Negative for joint pain, low back pain.  Derm: Negative for rash or itching.  Neuro: Negative for weakness, abnormal sensation, seizure, frequent headaches, confusion. +memory loss Psych: Negative for anxiety, depression, suicidal ideation, hallucinations.  Endo: Negative for unusual weight change.  Heme: Negative for bruising or bleeding. Allergy: Negative for rash or hives.    Physical Examination:  BP 94/61  Pulse 87  Temp(Src) 97.4 F (36.3 C) (Oral)  Ht 5\' 11"  (1.803 m)  Wt 161 lb 3.2 oz (73.12 kg)  BMI 22.49 kg/m2   General: Well-nourished, well-developed in no acute distress. Somewhat hard of hearing. Accompanied by significant other.  Head: Normocephalic, atraumatic.   Eyes: Conjunctiva pink, no icterus. Mouth: Oropharyngeal mucosa moist and pink , no lesions erythema or exudate. Neck: Supple without thyromegaly,  masses, or lymphadenopathy.  Lungs: Clear to auscultation bilaterally.  Heart: Regular rate and rhythm, no murmurs rubs or gallops.  Abdomen: Bowel sounds are normal, nontender, nondistended, no hepatosplenomegaly or masses, no abdominal bruits or    hernia , no rebound or guarding.   Rectal: not performed Extremities: No lower extremity edema. No clubbing or deformities.  Neuro: Alert and oriented x 4 , grossly normal neurologically.  Skin: Warm and dry, no rash or jaundice.   Psych: Alert and cooperative, normal mood and affect.  Labs: Lab Results  Component Value Date  ALT 17 04/06/2012   AST 22 04/06/2012   ALKPHOS 77 04/06/2012   BILITOT 0.8 04/06/2012   Lab Results  Component Value Date   CREATININE 1.1 04/06/2012   BUN 19 04/06/2012   NA 136 04/06/2012   K 4.1 04/06/2012   CL 100 04/06/2012   CO2 30 04/06/2012     Imaging Studies: No results found.

## 2012-06-28 NOTE — Op Note (Signed)
[facility] [address] [place], [address]   ENDOSCOPY PROCEDURE REPORT  PATIENT: Raymond [name], Raymond [name]  MR#: [id number] BIRTHDATE: October 26, 1926 , 85  yrs. old GENDER: Male ENDOSCOPIST: R.  Roetta Sessions, MD FACP FACG REFERRED BY:  Earley Favor, M.D. PROCEDURE DATE:  06/28/2012 PROCEDURE:     EGD with Elease Hashimoto dilation followed by gastric biopsy  INDICATIONS:    esophageal dysphagia  INFORMED CONSENT:   The risks, benefits, limitations, alternatives and imponderables have been discussed.  The potential for biopsy, esophogeal dilation, etc. have also been reviewed.  Questions have been answered.  All parties agreeable.  Please see the history and physical in the medical record for more information.  MEDICATIONS:   Versed 3 mg IV Demerol 75 mg IV in divided doses. Cetacaine spray. Zofran 4 mg IV  DESCRIPTION OF PROCEDURE:   The EG-[id number] ([id number])  endoscope was introduced through the mouth and advanced to the second portion of the duodenum without difficulty or limitations.  The mucosal surfaces were surveyed very carefully during advancement of the scope and upon withdrawal.  Retroflexion view of the proximal stomach and esophagogastric junction was performed.      FINDINGS: Tight cervical esophageal web-initially, would not admit the gastroscope. Upon applying gentle pressure the gastroscope was advanced across the cervical web with some associated dilation. The patient also had a distal esophageal web/short ring at the GE junction which again held up the scope; with gentle pressure, I was able to advance the scope across it. There was a single tiny esophageal diverticulum. The esophageal mucosa otherwise appeared normal. Stomach empty. 2 cm hiatal hernia. Couple of antral/prepyloric erosions. No ulcer or infiltrating process. Patent pylorus. Normal first and second portion of the duodenum.  THERAPEUTIC / DIAGNOSTIC MANEUVERS PERFORMED:   A 54 French  Maloney dilator is passed to full insertion easily. A look back revealed nice dilation of both the distal esophageal ring and cervical esophageal web with minimal bleeding and without apparent complication. 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 Hiatal hernia. Antral erosions-status post biopsy  RECOMMENDATIONS:  Ranitidine is inadequate for chronic reflux; I recommend this medication be stopped. Begin Protonix 40 mg daily. Swallowing cautions reviewed. Hold off on taking mesalamine preparations for reported ulcerative colitis (patient has had difficulty swallowing these medications anyway for the time being). Office visit with [place] in one month. Followup on pathology.    _______________________________ R. Roetta Sessions, MD FACP [facility] eSigned:  R. Roetta Sessions, MD FACP [facility] 06/28/2012 9:55 AM     CC:  PATIENT NAME:  Raymond [name], Raymond [name] MR#: [id number]

## 2012-07-03 ENCOUNTER — Encounter: Payer: Self-pay | Admitting: Family Medicine

## 2012-07-03 ENCOUNTER — Encounter: Payer: Self-pay | Admitting: Internal Medicine

## 2012-07-10 ENCOUNTER — Telehealth: Payer: Self-pay | Admitting: Family Medicine

## 2012-07-10 ENCOUNTER — Telehealth: Payer: Self-pay

## 2012-07-10 MED ORDER — PANTOPRAZOLE SODIUM 40 MG PO TBEC: 40.0000 mg | DELAYED_RELEASE_TABLET | Freq: Every day | ORAL | Status: DC

## 2012-07-10 NOTE — Telephone Encounter (Signed)
Patient is requesting all medications be refilled for 90 day supply if possible. [facility] [place].

## 2012-07-10 NOTE — Telephone Encounter (Signed)
done

## 2012-07-11 MED ORDER — ATORVASTATIN CALCIUM 20 MG PO TABS: 10.0000 mg | ORAL_TABLET | Freq: Every day | ORAL | Status: DC

## 2012-07-11 MED ORDER — NIACIN ER 500 MG PO CPCR: 1000.0000 mg | ORAL_CAPSULE | Freq: Every day | ORAL | Status: DC

## 2012-07-11 MED ORDER — LEVOTHYROXINE SODIUM 112 MCG PO TABS: 112.0000 ug | ORAL_TABLET | Freq: Every day | ORAL | Status: DC

## 2012-07-11 MED ORDER — PANTOPRAZOLE SODIUM 40 MG PO TBEC: 40.0000 mg | DELAYED_RELEASE_TABLET | Freq: Every day | ORAL | Status: DC

## 2012-07-11 MED ORDER — BENAZEPRIL HCL 20 MG PO TABS: 20.0000 mg | ORAL_TABLET | Freq: Every day | ORAL | Status: DC

## 2012-07-11 MED ORDER — ATENOLOL 50 MG PO TABS: 50.0000 mg | ORAL_TABLET | Freq: Every day | ORAL | Status: DC

## 2012-07-11 NOTE — Telephone Encounter (Signed)
Sent meds into pharmacy with 90 day supply no refills per patient request.  No refill until seen in office.

## 2012-07-19 ENCOUNTER — Encounter: Payer: Self-pay | Admitting: Gastroenterology

## 2012-07-19 NOTE — Progress Notes (Signed)
Reviewed all records received from [facility].  Patient underwent sigmoid colectomy and repair of colovesical fistula in 2004. According to Dr. Byrd Hesselbach note, patient had a cystoscopy performed which showed inflamed area consistent with fistula. Subsequently went on to have a colonoscopy by Dr. Gwinda Passe and had an intense diverticulosis with an area of reddening consistent with fistulous site but the remainder of the colon appeared normal.  Since that time patient had an endoscopy by Dr. Gwinda Passe in June 2009. He had a Schatzki ring narrowing the esophagus to less than 8 mm. Dilated up to 16 mm savory dilator. Erosive duty 9 his, biopsy and available. Colonoscopy June 2009 by Dr. Gwinda Passe showed mild diverticulosis, sigmoid anastomosis appeared normal. Prep was excellent.  Looking back through records available in Epic, there was a pathology report from 2000 with biopsy consistent with IBD, favoring ulcerative colitis but I do not have the actual colonoscopy report.  Patient has OV next week. To discuss use of mesalamine further at that time.

## 2012-07-26 ENCOUNTER — Encounter: Payer: Self-pay | Admitting: Internal Medicine

## 2012-07-28 ENCOUNTER — Encounter: Payer: Self-pay | Admitting: Gastroenterology

## 2012-07-28 ENCOUNTER — Ambulatory Visit (INDEPENDENT_AMBULATORY_CARE_PROVIDER_SITE_OTHER): Payer: Medicare Other | Admitting: Gastroenterology

## 2012-07-28 VITALS — BP 91/56 | HR 70 | Temp 98.4°F | Ht 71.0 in | Wt 160.2 lb

## 2012-07-28 DIAGNOSIS — R1319 Other dysphagia: Secondary | ICD-10-CM

## 2012-07-28 DIAGNOSIS — R1314 Dysphagia, pharyngoesophageal phase: Secondary | ICD-10-CM | POA: Diagnosis not present

## 2012-07-28 DIAGNOSIS — K5289 Other specified noninfective gastroenteritis and colitis: Secondary | ICD-10-CM

## 2012-07-28 DIAGNOSIS — R1310 Dysphagia, unspecified: Secondary | ICD-10-CM

## 2012-07-28 NOTE — Patient Instructions (Addendum)
1. Continue pantoprazole 40 mg daily indefinitely. 2. I will discuss with Dr. Jena Gauss further regarding need for Apriso once I have received records from Dr. Kinnie Scales. 3. Office visit in six months with Dr. Jena Gauss.

## 2012-07-28 NOTE — Assessment & Plan Note (Signed)
Retrieve copy of colonoscopy report from Dr. Sharrell Ku as well as subsequent office visit records. Patient has been on chronic mesalamine therapy, denies any symptoms suggestive of IBD flares. Has done very well off of mesalamine now for several weeks. Notably 2 documented colonoscopies without colitis however this may of been in the setting of mesalamine use and therefore could of had controlled, inactive colitis. Further recommendations to follow. Office visit in 6 months with Dr. Jena Gauss as well.

## 2012-07-28 NOTE — Progress Notes (Signed)
Primary Care Physician: Jeoffrey Massed, MD  Primary Gastroenterologist:  Roetta Sessions, MD   Chief Complaint  Patient presents with  . Follow-up    HPI: Raymond [name] is a 79 y.o. male here for f/u. H/O recurrent esophageal dysphagia. Recent EGD on 06/28/2012 showed critical cervical esophageal web and distal esophageal ring status post dilation/disruption. Antral erosions status post biopsy, showed reactive gastropathy likely due to medication. No H. pylori seen. Ranitidine was stopped. He was started on pantoprazole 40 mg daily. He also has a history of chronic mesalamine therapy for years and question whether this needs to be continued. Reviewed records from [facility]. He underwent sigmoid colectomy and repair of colovesical fistula in 2004. Colonoscopy by Dr. Gwinda Passe at that time showed intense to reticulosis with an area of reddening consistent with fistula site but the remainder of the colon was normal. Colonoscopy in June 2009 by Dr. Gwinda Passe showed mild diverticulosis, sigmoid anastomosis appeared normal. However in 2000 and there is a biopsy report in Epic consistent with IBD, favoring ulcerative colitis. Patient significant other tells me today that Dr. Sharrell Ku performed colonoscopy at this time. We have requested his records.  Patient states that his swallowing has dramatically improved. He denies any further dysphagia. Denies heartburn, abdominal pain, diarrhea, melena, rectal bleeding, constipation. Appetite is good.  Current Outpatient Prescriptions  Medication Sig Dispense Refill  . aspirin 81 MG tablet Take 81 mg by mouth daily.        Marland Kitchen atenolol (TENORMIN) 50 MG tablet Take 1 tablet (50 mg total) by mouth daily.  90 tablet  0  . atorvastatin (LIPITOR) 20 MG tablet Take 0.5 tablets (10 mg total) by mouth daily.  90 tablet  0  . benazepril (LOTENSIN) 20 MG tablet Take 1 tablet (20 mg total) by mouth daily.  90 tablet  0  . levothyroxine  (SYNTHROID, LEVOTHROID) 112 MCG tablet Take 1 tablet (112 mcg total) by mouth daily.  90 tablet  0  . niacin 500 MG CR capsule Take 2 capsules (1,000 mg total) by mouth daily.  90 capsule  0  . nitroGLYCERIN (NITROSTAT) 0.4 MG SL tablet Place 0.4 mg under the tongue every 5 (five) minutes as needed for chest pain.      . pantoprazole (PROTONIX) 40 MG tablet Take 1 tablet (40 mg total) by mouth daily before breakfast.  90 tablet  0   No current facility-administered medications for this visit.    Allergies as of 07/28/2012  . (No Known Allergies)    ROS:  General: Negative for anorexia, weight loss, fever, chills, fatigue, weakness. ENT: Negative for hoarseness, difficulty swallowing , nasal congestion. CV: Negative for chest pain, angina, palpitations, dyspnea on exertion, peripheral edema.  Respiratory: Negative for dyspnea at rest, dyspnea on exertion, cough, sputum, wheezing.  GI: See history of present illness. GU:  Negative for dysuria, hematuria, urinary incontinence, urinary frequency, nocturnal urination.  Endo: Negative for unusual weight change.    Physical Examination:   BP 91/56  Pulse 70  Temp(Src) 98.4 F (36.9 C) (Oral)  Ht 5\' 11"  (1.803 m)  Wt 160 lb 3.2 oz (72.666 kg)  BMI 22.35 kg/m2  General: Well-nourished, well-developed in no acute distress. Accompanied by significant other. Eyes: No icterus. Mouth: Oropharyngeal mucosa moist and pink , no lesions erythema or exudate. Lungs: Clear to auscultation bilaterally.  Heart: Regular rate and rhythm, no murmurs rubs or gallops.  Abdomen: Bowel sounds are normal, nontender, nondistended, no hepatosplenomegaly  or masses, no abdominal bruits or hernia , no rebound or guarding.   Extremities: No lower extremity edema. No clubbing or deformities. Neuro: Alert and oriented x 4   Skin: Warm and dry, no jaundice.   Psych: Alert and cooperative, normal mood and affect.

## 2012-07-28 NOTE — Assessment & Plan Note (Signed)
Esophageal dysphagia resolved status post dilation of Schatzki ring and cervical esophageal web. Patient has been instructed to let us know if he has recurrent symptoms. Continue PPI. Office visit in 6 months with Dr. Jena Gauss

## 2012-07-31 NOTE — Progress Notes (Signed)
Cc PCP 

## 2012-08-16 ENCOUNTER — Other Ambulatory Visit: Payer: Self-pay

## 2012-09-15 ENCOUNTER — Encounter: Payer: Self-pay | Admitting: Gastroenterology

## 2012-09-15 ENCOUNTER — Telehealth: Payer: Self-pay | Admitting: Gastroenterology

## 2012-09-15 NOTE — Telephone Encounter (Signed)
Reviewed old records: 78: TCS with mildly active mucosal colitis, favor acute self limited colitis.  2000: TCS showed acute proctitis, bx c/w UC. IBD first step assay further supported IBD. Started Asacol 12/1998.  Last TCS 2009 at [facility]  Currently patient is off mesalamine. Would consider resuming mesalamine if patient agrees. He should have TCS this year.  He can schedule OV whenever he is ready to do TCS.

## 2012-09-19 NOTE — Telephone Encounter (Signed)
Tried to call pt- LMOM 

## 2012-09-20 DIAGNOSIS — Z23 Encounter for immunization: Secondary | ICD-10-CM | POA: Diagnosis not present

## 2012-09-26 NOTE — Telephone Encounter (Signed)
Tried to call pt- LMOM 

## 2012-09-28 NOTE — Telephone Encounter (Signed)
Mailed letter to pt

## 2012-10-02 NOTE — Telephone Encounter (Signed)
pts girlfriend- Ottie Glazier called back for pt (pt has a hard time hearing on the phone) informed her of what LSL wanted and she informed pt. Pt said he was doing good right now and didn't want to go back on mesalamine, he is also not interested in a tcs now either. He will come back in 6 months for follow up with RMR and will discuss it with him then.

## 2012-10-02 NOTE — Telephone Encounter (Signed)
noted 

## 2012-10-03 ENCOUNTER — Telehealth: Payer: Self-pay | Admitting: Family Medicine

## 2012-10-03 NOTE — Telephone Encounter (Signed)
Patient's son is concerned with patients memory decreasing over time.  He is worried that he's getting alzheimer's and he lives alone.   Also, he is on lipitor and his son has read that lipitor could affect memory.  Could he try pravachol or crestor instead?   Patient is coming in this Friday for an appointment so son just wanted you to be aware of his concerns and wondered what you thought of them.  Please advise.

## 2012-10-04 NOTE — Telephone Encounter (Signed)
OK.  I'll talk with pt about meds/memory at his appt.-thx

## 2012-10-06 ENCOUNTER — Ambulatory Visit (INDEPENDENT_AMBULATORY_CARE_PROVIDER_SITE_OTHER): Payer: Medicare Other | Admitting: Family Medicine

## 2012-10-06 ENCOUNTER — Encounter: Payer: Self-pay | Admitting: Family Medicine

## 2012-10-06 VITALS — BP 124/76 | HR 62 | Temp 98.2°F | Resp 16 | Ht 68.5 in | Wt 164.0 lb

## 2012-10-06 DIAGNOSIS — K5289 Other specified noninfective gastroenteritis and colitis: Secondary | ICD-10-CM | POA: Diagnosis not present

## 2012-10-06 DIAGNOSIS — E039 Hypothyroidism, unspecified: Secondary | ICD-10-CM

## 2012-10-06 DIAGNOSIS — I10 Essential (primary) hypertension: Secondary | ICD-10-CM | POA: Diagnosis not present

## 2012-10-06 LAB — BASIC METABOLIC PANEL
BUN: 21 mg/dL (ref 6–23)
CO2: 28 mEq/L (ref 19–32)
Chloride: 104 mEq/L (ref 96–112)
Creatinine, Ser: 1.2 mg/dL (ref 0.4–1.5)

## 2012-10-06 NOTE — Progress Notes (Signed)
OFFICE NOTE  10/06/2012  CC: No chief complaint on file.    HPI: Patient is a 79 y.o. Caucasian male who is here with his wife today for 26mo f/u HTN, hyperlip, and hypothyroidism.   Last TSH was a tiny bit low so we decreased his dose to 112 mcg qd.  He did not get a recheck of TSH as ordered. Doing fine off of asachol/colitis med--saw Dr. Kendell Bane in [place].  No GERD problems.  Unclear if he is taking protonix or not. Some tiredness that he attributes to getting older. Some memory/cognitive problems that seem to be stable per pt and his wife.  He repeats himself some, has trouble remembering what day it is and what he did yesterday.  He still drives and does not get lost.  Stays around the house a lot and plays solitaire a lot per his wife.  No depressed mood.  He states today that he feels like he's doing very well "for an 78 y/o man".  Compliant with meds.  No BP monitoring at home.   Pertinent PMH:  Past Medical History  Diagnosis Date  . ASCVD (arteriosclerotic cardiovascular disease)     2 MI's in 1990s  . Hypothyroidism   . Hyperlipidemia   . Anemia   . Thrombocytopenia     Platelets 99K on 09/2009 labs  . Esophageal dysphagia     Secondary to Schatzki's ring with dilation x2, most recently in 06/09, negative for H. pylori  . Psoriasis   . DJD (degenerative joint disease)   . Myocardial infarction 08-1996    Cardiac cath done but no other intervention that we know of  . Mild cognitive impairment with memory loss   . HTN (hypertension)   . Ulcerative proctitis     Dx: 2000, started Asacol at that time    MEDS:  Outpatient Prescriptions Prior to Visit  Medication Sig Dispense Refill  . aspirin 81 MG tablet Take 81 mg by mouth daily.        Marland Kitchen atenolol (TENORMIN) 50 MG tablet Take 1 tablet (50 mg total) by mouth daily.  90 tablet  0  . atorvastatin (LIPITOR) 20 MG tablet Take 0.5 tablets (10 mg total) by mouth daily.  90 tablet  0  . benazepril (LOTENSIN) 20 MG tablet  Take 1 tablet (20 mg total) by mouth daily.  90 tablet  0  . levothyroxine (SYNTHROID, LEVOTHROID) 112 MCG tablet Take 1 tablet (112 mcg total) by mouth daily.  90 tablet  0  . niacin 500 MG CR capsule Take 2 capsules (1,000 mg total) by mouth daily.  90 capsule  0  . nitroGLYCERIN (NITROSTAT) 0.4 MG SL tablet Place 0.4 mg under the tongue every 5 (five) minutes as needed for chest pain.      . pantoprazole (PROTONIX) 40 MG tablet Take 1 tablet (40 mg total) by mouth daily before breakfast.  90 tablet  0   No facility-administered medications prior to visit.    PE: Blood pressure 124/76, pulse 62, temperature 98.2 F (36.8 C), temperature source Temporal, resp. rate 16, height 5' 8.5" (1.74 m), weight 164 lb (74.39 kg), SpO2 97.00%. Gen: Alert, well appearing.  Patient is oriented to person, place, time, and situation. ENT:  Eyes: no injection, icteris, swelling, or exudate.  EOMI, PERRLA. Nose: no drainage or turbinate edema/swelling.  No injection or focal lesion.  Mouth: lips without lesion/swelling.  Oral mucosa pink and moist. Oropharynx without erythema, exudate, or swelling.  Neck: supple/nontender.  No LAD, mass, or TM.  Carotid pulses 2+ bilaterally, without bruits. CV: RRR, no m/r/g.   LUNGS: CTA bilat, nonlabored resps, good aeration in all lung fields.   IMPRESSION AND PLAN:  HYPOTHYROIDISM Overdue for recheck TSH--will check today.  Unspecified essential hypertension Stable. Continue current meds. Check BMET today.  Other and unspecified noninfectious gastroenteritis and colitis(558.9) Doing well on no meds.  He has already had flu vaccine this season.  An After Visit Summary was printed and given to the patient.  FOLLOW UP: 6 mo

## 2012-10-06 NOTE — Assessment & Plan Note (Signed)
Stable. Continue current meds. Check BMET today.

## 2012-10-06 NOTE — Assessment & Plan Note (Signed)
Overdue for recheck TSH--will check today.

## 2012-10-06 NOTE — Assessment & Plan Note (Signed)
Doing well on no meds. 

## 2012-10-09 MED ORDER — LEVOTHYROXINE SODIUM 125 MCG PO TABS: 125.0000 ug | ORAL_TABLET | Freq: Every day | ORAL | Status: DC

## 2012-10-09 NOTE — Addendum Note (Signed)
Addended by: Eulah Pont on: 10/09/2012 08:38 AM   Modules accepted: Orders, Medications

## 2012-10-26 ENCOUNTER — Telehealth: Payer: Self-pay | Admitting: Emergency Medicine

## 2012-10-26 ENCOUNTER — Other Ambulatory Visit: Payer: Self-pay | Admitting: Family Medicine

## 2012-10-26 ENCOUNTER — Other Ambulatory Visit: Payer: Self-pay

## 2012-10-26 MED ORDER — BENAZEPRIL HCL 20 MG PO TABS: 20.0000 mg | ORAL_TABLET | Freq: Every day | ORAL | Status: DC

## 2012-10-26 MED ORDER — ATENOLOL 50 MG PO TABS: 50.0000 mg | ORAL_TABLET | Freq: Every day | ORAL | Status: DC

## 2012-10-26 NOTE — Telephone Encounter (Signed)
benazepril 20 mg atenolol 50 mg patient would like 90 day supply instead of 30 day supply on meds, please advise patient.

## 2012-10-27 MED ORDER — PANTOPRAZOLE SODIUM 40 MG PO TBEC: 40.0000 mg | DELAYED_RELEASE_TABLET | Freq: Every day | ORAL | Status: DC

## 2013-04-05 ENCOUNTER — Ambulatory Visit (INDEPENDENT_AMBULATORY_CARE_PROVIDER_SITE_OTHER): Payer: Medicare Other | Admitting: Family Medicine

## 2013-04-05 ENCOUNTER — Encounter: Payer: Self-pay | Admitting: Family Medicine

## 2013-04-05 VITALS — BP 113/64 | HR 70 | Temp 98.2°F | Resp 18 | Ht 68.5 in | Wt 160.0 lb

## 2013-04-05 DIAGNOSIS — F0390 Unspecified dementia without behavioral disturbance: Secondary | ICD-10-CM

## 2013-04-05 DIAGNOSIS — I10 Essential (primary) hypertension: Secondary | ICD-10-CM | POA: Diagnosis not present

## 2013-04-05 DIAGNOSIS — E785 Hyperlipidemia, unspecified: Secondary | ICD-10-CM

## 2013-04-05 DIAGNOSIS — E039 Hypothyroidism, unspecified: Secondary | ICD-10-CM | POA: Diagnosis not present

## 2013-04-05 LAB — COMPREHENSIVE METABOLIC PANEL
ALBUMIN: 4.1 g/dL (ref 3.5–5.2)
ALK PHOS: 78 U/L (ref 39–117)
ALT: 20 U/L (ref 0–53)
AST: 24 U/L (ref 0–37)
BUN: 16 mg/dL (ref 6–23)
CALCIUM: 9.4 mg/dL (ref 8.4–10.5)
CHLORIDE: 106 meq/L (ref 96–112)
CO2: 29 mEq/L (ref 19–32)
Creatinine, Ser: 1.1 mg/dL (ref 0.4–1.5)
GFR: 70.69 mL/min (ref >60.00)
Glucose, Bld: 121 mg/dL — ABNORMAL HIGH (ref 70–99)
POTASSIUM: 4.2 meq/L (ref 3.5–5.1)
SODIUM: 140 meq/L (ref 135–145)
TOTAL PROTEIN: 7.3 g/dL (ref 6.0–8.3)
Total Bilirubin: 0.7 mg/dL (ref 0.3–1.2)

## 2013-04-05 LAB — TSH: TSH: 0.31 u[IU]/mL — ABNORMAL LOW (ref 0.35–5.50)

## 2013-04-05 MED ORDER — DONEPEZIL HCL 5 MG PO TABS: 5.0000 mg | ORAL_TABLET | Freq: Every day | ORAL | Status: DC

## 2013-04-05 NOTE — Progress Notes (Signed)
Pre visit review using our clinic review tool, if applicable. No additional management support is needed unless otherwise documented below in the visit note. 

## 2013-04-05 NOTE — Progress Notes (Addendum)
OFFICE NOTE  04/05/13  CC:  Chief Complaint  Patient presents with  . Follow-up     HPI: Patient is a 80 y.o. Caucasian male who is here for 6 mo f/u hypothyroidism, hyperlipidemia, HTN, mild cognitive impairment. Girlfriend here with him today and states his memory issues are getting worse since last visit.  At times he is "unkempt".  Gets very frustrated about poor memory, sometimes to the point of tears.  Asks questions repeatedly, repeats conversations. Overdue for TSH CHECK, about 6 mo ago we increased his dose a bit due to TSH 7.24. Due for FLP. Reports compliance with chronic meds.  ROS: no CP, no SOB, no palpitations, no focal weakness, no melena or BRBPR, no abd pains.  No ataxia, no paresthesias.  Energy level and appetite are fine.  Pertinent PMH:  Past medical, surgical, social, and family history reviewed and no changes are noted since last office visit.  MEDS:  Outpatient Prescriptions Prior to Visit  Medication Sig Dispense Refill  . aspirin 81 MG tablet Take 81 mg by mouth daily.        Marland Kitchen atenolol (TENORMIN) 50 MG tablet Take 1 tablet (50 mg total) by mouth daily.  90 tablet  1  . atorvastatin (LIPITOR) 20 MG tablet Take 0.5 tablets (10 mg total) by mouth daily.  90 tablet  0  . benazepril (LOTENSIN) 20 MG tablet Take 1 tablet (20 mg total) by mouth daily.  90 tablet  1  . niacin 500 MG CR capsule Take 2 capsules (1,000 mg total) by mouth daily.  90 capsule  0  . nitroGLYCERIN (NITROSTAT) 0.4 MG SL tablet Place 0.4 mg under the tongue every 5 (five) minutes as needed for chest pain.      . pantoprazole (PROTONIX) 40 MG tablet Take 1 tablet (40 mg total) by mouth daily before breakfast.  90 tablet  3  . levothyroxine (SYNTHROID, LEVOTHROID) 125 MCG tablet Take 1 tablet (125 mcg total) by mouth daily before breakfast.  30 tablet  2   No facility-administered medications prior to visit.    PE: Blood pressure 113/64, pulse 70, temperature 98.2 F (36.8 C),  temperature source Temporal, resp. rate 18, height 5' 8.5" (1.74 m), weight 160 lb (72.576 kg), SpO2 99.00%. Gen: Alert, well appearing.  Patient is oriented to person, place, time, and situation. He is hard of hearing.  Pleasant but a bit defensive when discussing his memory/cognition.   CV: 1/6 syst murmur, RRR, S1 and S2 normal. LUNGS: CTA bilat, nonlabored. MMSE: 21/30.  He could draw a clock face but could not draw the hour and minute hands to say the time. Neuro: CN 2-12 intact bilaterally, strength 5/5 in proximal and distal upper extremities and lower extremities bilaterally.  No tremor.  No disdiadochokinesis.  No ataxia.  Upper extremity and lower extremity DTRs symmetric.  No pronator drift.  IMPRESSION AND PLAN:  Dementia Discussed w/u options. At this time we'll do no imaging.  Tried to do vit B12 and RPR but medicare would not allow. Decided on trial of aricept 5mg  qd. Increase to 10mg  at f/u in 1 mo if no adverse effects noted.  HYPERLIPIDEMIA Doing fine on statin. FLP today.  HYPOTHYROIDISM Due for TSH recheck today.  Unspecified essential hypertension The current medical regimen is effective;  continue present plan and medications. BMET today.   Spent 30 min with pt today, with >50% of this time spent in counseling and care coordination regarding the above problems.  An  After Visit Summary was printed and given to the patient.  FOLLOW UP: 70mo f/u dementia

## 2013-04-06 MED ORDER — LEVOTHYROXINE SODIUM 125 MCG PO TABS: 125.0000 ug | ORAL_TABLET | Freq: Every day | ORAL | Status: DC

## 2013-04-16 ENCOUNTER — Encounter: Payer: Self-pay | Admitting: Family Medicine

## 2013-04-16 DIAGNOSIS — E039 Hypothyroidism, unspecified: Secondary | ICD-10-CM | POA: Insufficient documentation

## 2013-04-16 DIAGNOSIS — F0390 Unspecified dementia without behavioral disturbance: Secondary | ICD-10-CM | POA: Insufficient documentation

## 2013-04-16 DIAGNOSIS — E785 Hyperlipidemia, unspecified: Secondary | ICD-10-CM | POA: Insufficient documentation

## 2013-04-16 NOTE — Assessment & Plan Note (Signed)
Due for TSH recheck today. 

## 2013-04-16 NOTE — Assessment & Plan Note (Signed)
Discussed w/u options. At this time we'll do no imaging.  Tried to do vit B12 and RPR but medicare would not allow. Decided on trial of aricept 5mg  qd. Increase to 10mg  at f/u in 1 mo if no adverse effects noted.

## 2013-04-16 NOTE — Assessment & Plan Note (Signed)
The current medical regimen is effective;  continue present plan and medications. BMET today. 

## 2013-04-16 NOTE — Assessment & Plan Note (Signed)
Doing fine on statin. FLP today. 

## 2013-04-17 ENCOUNTER — Other Ambulatory Visit: Payer: Self-pay | Admitting: *Deleted

## 2013-04-17 MED ORDER — ATORVASTATIN CALCIUM 20 MG PO TABS: 10.0000 mg | ORAL_TABLET | Freq: Every day | ORAL | Status: DC

## 2013-04-23 ENCOUNTER — Other Ambulatory Visit: Payer: Self-pay | Admitting: Family Medicine

## 2013-04-23 MED ORDER — ATORVASTATIN CALCIUM 20 MG PO TABS: 10.0000 mg | ORAL_TABLET | Freq: Every day | ORAL | Status: DC

## 2013-05-03 ENCOUNTER — Ambulatory Visit (INDEPENDENT_AMBULATORY_CARE_PROVIDER_SITE_OTHER): Payer: Medicare Other | Admitting: Family Medicine

## 2013-05-03 ENCOUNTER — Encounter: Payer: Self-pay | Admitting: Family Medicine

## 2013-05-03 ENCOUNTER — Ambulatory Visit: Payer: Medicare Other | Admitting: Family Medicine

## 2013-05-03 VITALS — BP 94/60 | HR 65 | Temp 98.9°F | Resp 18 | Ht 68.5 in | Wt 162.0 lb

## 2013-05-03 DIAGNOSIS — I959 Hypotension, unspecified: Secondary | ICD-10-CM

## 2013-05-03 DIAGNOSIS — F0390 Unspecified dementia without behavioral disturbance: Secondary | ICD-10-CM

## 2013-05-03 DIAGNOSIS — I10 Essential (primary) hypertension: Secondary | ICD-10-CM | POA: Diagnosis not present

## 2013-05-03 MED ORDER — BENAZEPRIL HCL 20 MG PO TABS: ORAL_TABLET | ORAL | Status: DC

## 2013-05-03 MED ORDER — DONEPEZIL HCL 10 MG PO TABS: 10.0000 mg | ORAL_TABLET | Freq: Every day | ORAL | Status: DC

## 2013-05-03 NOTE — Progress Notes (Signed)
OFFICE NOTE  05/03/2013  CC:  Chief Complaint  Patient presents with  . Follow-up     HPI: Patient is a 80 y.o. Caucasian male who is here for 1 mo f/u dementia, started 5mg  qd aricept last visit. Significant improvement noted!  Calmer, less overwhelmed-feeling, "I can keep up with things easier", less frustrated and snappy per wife.  Denies any side effects. No home bp numbers to report.  Denies any orthostatic sx's or fatigue. Review of past bp's here show multiple low normal and multiple like today's (low).   Pertinent PMH:  Past medical, surgical, social, and family history reviewed and no changes are noted since last office visit.  MEDS:  Outpatient Prescriptions Prior to Visit  Medication Sig Dispense Refill  . aspirin 81 MG tablet Take 81 mg by mouth daily.        Marland Kitchen atenolol (TENORMIN) 50 MG tablet Take 1 tablet (50 mg total) by mouth daily.  90 tablet  1  . atorvastatin (LIPITOR) 20 MG tablet Take 0.5 tablets (10 mg total) by mouth daily.  90 tablet  1  . benazepril (LOTENSIN) 20 MG tablet Take 1 tablet (20 mg total) by mouth daily.  90 tablet  1  . donepezil (ARICEPT) 5 MG tablet Take 1 tablet (5 mg total) by mouth at bedtime.  30 tablet  1  . levothyroxine (SYNTHROID, LEVOTHROID) 125 MCG tablet Take 1 tablet (125 mcg total) by mouth daily before breakfast.  90 tablet  3  . niacin 500 MG CR capsule Take 2 capsules (1,000 mg total) by mouth daily.  90 capsule  0  . nitroGLYCERIN (NITROSTAT) 0.4 MG SL tablet Place 0.4 mg under the tongue every 5 (five) minutes as needed for chest pain.      . pantoprazole (PROTONIX) 40 MG tablet Take 1 tablet (40 mg total) by mouth daily before breakfast.  90 tablet  3   No facility-administered medications prior to visit.    PE: Blood pressure 94/60, pulse 65, temperature 98.9 F (37.2 C), temperature source Temporal, resp. rate 18, height 5' 8.5" (1.74 m), weight 162 lb (73.483 kg), SpO2 97.00%. Gen: Alert, well appearing.  Patient is  oriented to person, place, time, and situation. CV: RRR, slightly distant S1 and S2, no audible m/r/g Chest is clear, no wheezing or rales. Normal symmetric air entry throughout both lung fields. No chest wall deformities or tenderness. EXT: 2+ pitting edema bilat Neuro: CN 2-12 intact bilaterally, strength 5/5 in proximal and distal upper extremities and lower extremities bilaterally.   No tremor.  No disdiadochokinesis.  No ataxia.  Upper extremity and lower extremity DTRs symmetric.  No pronator drift.   LABS: none today Recent: Lab Results  Component Value Date   TSH 0.31* 04/05/2013     Chemistry      Component Value Date/Time   NA 140 04/05/2013 1030   K 4.2 04/05/2013 1030   CL 106 04/05/2013 1030   CO2 29 04/05/2013 1030   BUN 16 04/05/2013 1030   CREATININE 1.1 04/05/2013 1030      Component Value Date/Time   CALCIUM 9.4 04/05/2013 1030   ALKPHOS 78 04/05/2013 1030   AST 24 04/05/2013 1030   ALT 20 04/05/2013 1030   BILITOT 0.7 04/05/2013 1030      IMPRESSION AND PLAN:  1) Mild dementia; improved with aricept 5mg  x 1 mo. INcrease to 10mg  qd.  F/u 6-8 wks.  2) HTN: bp actually on low to low normal side majority  of the time: will cut lotensin back to 1/2 of 20mg  tab daily.  3) Hypothyroidism: recent TSH stable, no change in med dosing.  An After Visit Summary was printed and given to the patient.  FOLLOW UP: 6-8 wks

## 2013-05-03 NOTE — Addendum Note (Signed)
Addended by: Tammi Sou on: 05/03/2013 03:41 PM   Modules accepted: Orders

## 2013-05-03 NOTE — Progress Notes (Signed)
Pre visit review using our clinic review tool, if applicable. No additional management support is needed unless otherwise documented below in the visit note. 

## 2013-06-18 ENCOUNTER — Other Ambulatory Visit: Payer: Self-pay | Admitting: Family Medicine

## 2013-06-18 MED ORDER — ATENOLOL 50 MG PO TABS: 50.0000 mg | ORAL_TABLET | Freq: Every day | ORAL | Status: DC

## 2013-07-21 ENCOUNTER — Emergency Department (HOSPITAL_COMMUNITY)
Admission: EM | Admit: 2013-07-21 | Discharge: 2013-07-21 | Disposition: A | Discharge status: Home / Self Care | Payer: Medicare Other | Attending: Emergency Medicine | Admitting: Emergency Medicine

## 2013-07-21 ENCOUNTER — Encounter (HOSPITAL_COMMUNITY): Payer: Self-pay | Admitting: Emergency Medicine

## 2013-07-21 DIAGNOSIS — Z23 Encounter for immunization: Secondary | ICD-10-CM | POA: Insufficient documentation

## 2013-07-21 DIAGNOSIS — Z79899 Other long term (current) drug therapy: Secondary | ICD-10-CM | POA: Diagnosis not present

## 2013-07-21 DIAGNOSIS — K219 Gastro-esophageal reflux disease without esophagitis: Secondary | ICD-10-CM | POA: Insufficient documentation

## 2013-07-21 DIAGNOSIS — Z8669 Personal history of other diseases of the nervous system and sense organs: Secondary | ICD-10-CM | POA: Insufficient documentation

## 2013-07-21 DIAGNOSIS — S81009A Unspecified open wound, unspecified knee, initial encounter: Secondary | ICD-10-CM | POA: Diagnosis not present

## 2013-07-21 DIAGNOSIS — S81812A Laceration without foreign body, left lower leg, initial encounter: Secondary | ICD-10-CM

## 2013-07-21 DIAGNOSIS — F0390 Unspecified dementia without behavioral disturbance: Secondary | ICD-10-CM | POA: Diagnosis not present

## 2013-07-21 DIAGNOSIS — S81809A Unspecified open wound, unspecified lower leg, initial encounter: Principal | ICD-10-CM

## 2013-07-21 DIAGNOSIS — Z8739 Personal history of other diseases of the musculoskeletal system and connective tissue: Secondary | ICD-10-CM | POA: Insufficient documentation

## 2013-07-21 DIAGNOSIS — S91009A Unspecified open wound, unspecified ankle, initial encounter: Principal | ICD-10-CM

## 2013-07-21 DIAGNOSIS — Y929 Unspecified place or not applicable: Secondary | ICD-10-CM | POA: Insufficient documentation

## 2013-07-21 DIAGNOSIS — Z862 Personal history of diseases of the blood and blood-forming organs and certain disorders involving the immune mechanism: Secondary | ICD-10-CM | POA: Insufficient documentation

## 2013-07-21 DIAGNOSIS — I10 Essential (primary) hypertension: Secondary | ICD-10-CM | POA: Insufficient documentation

## 2013-07-21 DIAGNOSIS — E039 Hypothyroidism, unspecified: Secondary | ICD-10-CM | POA: Diagnosis not present

## 2013-07-21 DIAGNOSIS — Y9389 Activity, other specified: Secondary | ICD-10-CM | POA: Insufficient documentation

## 2013-07-21 DIAGNOSIS — I252 Old myocardial infarction: Secondary | ICD-10-CM | POA: Diagnosis not present

## 2013-07-21 DIAGNOSIS — Z872 Personal history of diseases of the skin and subcutaneous tissue: Secondary | ICD-10-CM | POA: Diagnosis not present

## 2013-07-21 DIAGNOSIS — Z7982 Long term (current) use of aspirin: Secondary | ICD-10-CM | POA: Diagnosis not present

## 2013-07-21 DIAGNOSIS — Z87891 Personal history of nicotine dependence: Secondary | ICD-10-CM | POA: Diagnosis not present

## 2013-07-21 DIAGNOSIS — E785 Hyperlipidemia, unspecified: Secondary | ICD-10-CM | POA: Insufficient documentation

## 2013-07-21 DIAGNOSIS — X58XXXA Exposure to other specified factors, initial encounter: Secondary | ICD-10-CM | POA: Insufficient documentation

## 2013-07-21 MED ORDER — TETANUS-DIPHTH-ACELL PERTUSSIS 5-2.5-18.5 LF-MCG/0.5 IM SUSP
0.5000 mL | Freq: Once | INTRAMUSCULAR | Status: AC
  Administered 2013-07-21: 0.5 mL via INTRAMUSCULAR
  Filled 2013-07-21: qty 0.5

## 2013-07-21 NOTE — ED Notes (Signed)
Dressing applied to wound.  

## 2013-07-21 NOTE — ED Provider Notes (Signed)
CSN: [id number]     Arrival date & time 07/21/13  0052 History   First MD Initiated Contact with Patient 07/21/13 0242     Chief Complaint  - wound     The history is provided by the patient and a significant other.  Patient presents for bleeding wound Pt scratched at a place on his left leg and soon after it began to bleed.  This occurred earlier tonight  Patient reports he bled for awhile before he could stop the bleeding.  He now feels improved.  It improved with pressure.  He denies any trauma.  He is not on any anticoagulants.    Past Medical History  Diagnosis Date  . ASCVD (arteriosclerotic cardiovascular disease)     2 MI's in 1990s  . Hypothyroidism   . Hyperlipidemia   . Anemia   . Thrombocytopenia     Platelets 99K on 09/2009 labs  . Esophageal dysphagia     Secondary to Schatzki's ring with dilation x2, most recently in 06/09, negative for H. pylori  . Psoriasis   . DJD (degenerative joint disease)   . Myocardial infarction 08-1996    Cardiac cath done but no other intervention that we know of  . Mild cognitive impairment with memory loss   . HTN (hypertension)   . Ulcerative proctitis     Dx: 2000, started Asacol at that time.  Was taken off asacol 2014 and has done fine since (Dr. Sydell Axon, [facility])  . GERD (gastroesophageal reflux disease)   . Dementia    Past Surgical History  Procedure Laterality Date  . Cleft lip repair      As a child  . Neck mass excision  1979  . Colectomy  80    Colonovesicular fistula secondary to diverticulitis requiring sigmoid colectomy and bladder repair  . Colonoscopy  06/2007    Dr. Olegario Messier: Few diverticula, normal anastomosiss/p sigmoid colectomy for diverticulitis  . Appendectomy  1940  . Hernia repair    . Colovesicular fistula  2004  . Esophageal dilation  06-23-07; 06/28/12    H pylori NEG.  2014-reactive gastropathy with surface erosion.  . Esophagogastroduodenoscopy (egd) with esophageal dilation N/A  06/28/2012    [facility]:HENIDPOE cervical web and distal esophageal s/p dilation/Hiatal hernia. Antral erosions (reactive gastropathy, no H.pylori)  . Esophagogastroduodenoscopy  06/2007    Dr. Olegario Messier: symptomatic Schatzki ring, s/p 7mm Savary dilator, Erosive duodenitis, s/p bx    Family History  Problem Relation Age of Onset  . Heart disease Daughter     heart defect: d age 64  . Cancer Father     pt doesn't remember type   History  Substance Use Topics  . Smoking status: Former Smoker -- 1.00 packs/day for 10 years    Types: Cigarettes    Quit date: 01/11/1970  . Smokeless tobacco: Never Used     Comment: Minimal use at 20  . Alcohol Use: No    Review of Systems  Constitutional: Negative for fever.  Skin: Positive for wound.  Neurological: Negative for dizziness and weakness.      Allergies  Review of patient's allergies indicates no known allergies.  Home Medications   Prior to Admission medications   Medication Sig Start Date End Date Taking? Authorizing Provider  aspirin 81 MG tablet Take 81 mg by mouth daily.      Historical Provider, MD  atenolol (TENORMIN) 50 MG tablet Take 1 tablet (50 mg total) by mouth daily. 06/18/13  Tammi Sou, MD  atorvastatin (LIPITOR) 20 MG tablet Take 0.5 tablets (10 mg total) by mouth daily. 04/23/13   Tammi Sou, MD  benazepril (LOTENSIN) 20 MG tablet 1/2 tab po qd 05/03/13   Tammi Sou, MD  donepezil (ARICEPT) 10 MG tablet Take 1 tablet (10 mg total) by mouth at bedtime. 05/03/13   Tammi Sou, MD  levothyroxine (SYNTHROID, LEVOTHROID) 125 MCG tablet Take 1 tablet (125 mcg total) by mouth daily before breakfast. 04/06/13   Tammi Sou, MD  niacin 500 MG CR capsule Take 2 capsules (1,000 mg total) by mouth daily. 07/11/12   Tammi Sou, MD  nitroGLYCERIN (NITROSTAT) 0.4 MG SL tablet Place 0.4 mg under the tongue every 5 (five) minutes as needed for chest pain.    Historical Provider, MD  pantoprazole  (PROTONIX) 40 MG tablet Take 1 tablet (40 mg total) by mouth daily before breakfast. 10/26/12   Mahala Menghini, PA-C   BP 130/63  Pulse 76  Temp(Src) 98.2 F (36.8 C)  Resp 18  Ht 5\' 11"  (1.803 m)  Wt 160 lb (72.576 kg)  BMI 22.33 kg/m2  SpO2 99% Physical Exam CONSTITUTIONAL: Well developed/well nourished HEAD: Normocephalic/atraumatic ENMT: Mucous membranes moist NECK: supple no meningeal signs ABDOMEN: soft NEURO: Pt is awake/alert, moves all extremitiesx4 EXTREMITIES: pulses normal, full ROM Small pinpoint area lateral to left knee that has dried blood noted, no active bleeding. Suspect this is small varicose vein that is now without bleeding SKIN: warm, color normal PSYCH: no abnormalities of mood noted  ED Course  Procedures Bleeding has stopped Pt is well appearing/stable for d/c home MDM   Final diagnoses:  Laceration of left lower extremity, initial encounter    Nursing notes including past medical history and social history reviewed and considered in documentation    Sharyon Cable, MD 07/21/13 [phone]

## 2013-07-21 NOTE — ED Notes (Signed)
Per family, pt scratched a place on left knee that was itching then area began to bleed.  Family able to control bleeding at home, till pt began to get ready for bed, then bleeding started again.

## 2013-07-27 ENCOUNTER — Other Ambulatory Visit: Payer: Self-pay | Admitting: Family Medicine

## 2013-07-27 MED ORDER — NIACIN ER 500 MG PO CPCR: 1000.0000 mg | ORAL_CAPSULE | Freq: Every day | ORAL | Status: DC

## 2013-07-27 MED ORDER — DONEPEZIL HCL 10 MG PO TABS: 10.0000 mg | ORAL_TABLET | Freq: Every day | ORAL | Status: DC

## 2013-08-23 ENCOUNTER — Other Ambulatory Visit: Payer: Self-pay | Admitting: *Deleted

## 2013-08-23 MED ORDER — NIACIN ER 500 MG PO CPCR: 1000.0000 mg | ORAL_CAPSULE | Freq: Every day | ORAL | Status: DC

## 2013-09-20 DIAGNOSIS — Z23 Encounter for immunization: Secondary | ICD-10-CM | POA: Diagnosis not present

## 2013-09-26 ENCOUNTER — Other Ambulatory Visit: Payer: Self-pay

## 2013-09-27 MED ORDER — PANTOPRAZOLE SODIUM 40 MG PO TBEC: 40.0000 mg | DELAYED_RELEASE_TABLET | Freq: Every day | ORAL | Status: DC

## 2013-10-23 ENCOUNTER — Ambulatory Visit (INDEPENDENT_AMBULATORY_CARE_PROVIDER_SITE_OTHER): Payer: Medicare Other | Admitting: Family Medicine

## 2013-10-23 ENCOUNTER — Encounter: Payer: Self-pay | Admitting: Family Medicine

## 2013-10-23 ENCOUNTER — Encounter: Payer: Medicare Other | Admitting: Family Medicine

## 2013-10-23 VITALS — BP 137/81 | HR 75 | Temp 97.8°F | Resp 18 | Ht 68.5 in | Wt 163.0 lb

## 2013-10-23 DIAGNOSIS — E785 Hyperlipidemia, unspecified: Secondary | ICD-10-CM | POA: Diagnosis not present

## 2013-10-23 DIAGNOSIS — E039 Hypothyroidism, unspecified: Secondary | ICD-10-CM | POA: Diagnosis not present

## 2013-10-23 DIAGNOSIS — F0390 Unspecified dementia without behavioral disturbance: Secondary | ICD-10-CM

## 2013-10-23 DIAGNOSIS — I10 Essential (primary) hypertension: Secondary | ICD-10-CM

## 2013-10-23 DIAGNOSIS — Z23 Encounter for immunization: Secondary | ICD-10-CM | POA: Diagnosis not present

## 2013-10-23 LAB — LIPID PANEL
CHOLESTEROL: 119 mg/dL (ref 0–200)
HDL: 33.4 mg/dL — ABNORMAL LOW (ref >39.00)
LDL CALC: 70 mg/dL (ref 0–99)
NONHDL: 85.6
Total CHOL/HDL Ratio: 4
Triglycerides: 76 mg/dL (ref 0.0–149.0)
VLDL: 15.2 mg/dL (ref 0.0–40.0)

## 2013-10-23 LAB — BASIC METABOLIC PANEL
BUN: 16 mg/dL (ref 6–23)
CO2: 25 mEq/L (ref 19–32)
Calcium: 9.6 mg/dL (ref 8.4–10.5)
Chloride: 105 mEq/L (ref 96–112)
Creatinine, Ser: 1.1 mg/dL (ref 0.4–1.5)
GFR: 66.94 mL/min (ref >60.00)
GLUCOSE: 128 mg/dL — AB (ref 70–99)
POTASSIUM: 3.8 meq/L (ref 3.5–5.1)
SODIUM: 139 meq/L (ref 135–145)

## 2013-10-23 LAB — TSH: TSH: 0.29 u[IU]/mL — ABNORMAL LOW (ref 0.35–4.50)

## 2013-10-23 NOTE — Progress Notes (Signed)
OFFICE VISIT  10/23/2013   CC:  Chief Complaint  Patient presents with  . Follow-up    fasting   HPI:    Patient is a 80 y.o. Caucasian male who presents for 6 mo f/u dementia, HTN, hyperlipidemia, hypothyroidism. No acute complaints. Compliant with meds. Per wife's report, memory continues to decline, he is getting lost some while driving.   Past Medical History  Diagnosis Date  . ASCVD (arteriosclerotic cardiovascular disease)     2 MI's in 1990s  . Hypothyroidism   . Hyperlipidemia   . Anemia   . Thrombocytopenia     Platelets 99K on 09/2009 labs  . Esophageal dysphagia     Secondary to Schatzki's ring with dilation x2, most recently in 06/09, negative for H. pylori  . Psoriasis   . DJD (degenerative joint disease)   . Myocardial infarction 08-1996    Cardiac cath done but no other intervention that we know of  . Mild cognitive impairment with memory loss   . HTN (hypertension)   . Ulcerative proctitis     Dx: 2000, started Asacol at that time.  Was taken off asacol 2014 and has done fine since (Dr. Sydell Axon, [facility])  . GERD (gastroesophageal reflux disease)   . Dementia     Past Surgical History  Procedure Laterality Date  . Cleft lip repair      As a child  . Neck mass excision  1979  . 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 Colonoscopy  06/2007    Dr. Olegario Messier: Few diverticula, normal anastomosiss/p sigmoid colectomy for diverticulitis  . Appendectomy  1940  . Hernia repair    . Colovesicular fistula  2004  . Esophageal dilation  06-23-07; 06/28/12    H pylori NEG.  2014-reactive gastropathy with surface erosion.  . Esophagogastroduodenoscopy (egd) with esophageal dilation N/A 06/28/2012    [id number] cervical web and distal esophageal s/p dilation/Hiatal hernia. Antral erosions (reactive gastropathy, no H.pylori)  . Esophagogastroduodenoscopy  06/2007    Dr.  Olegario Messier: symptomatic Schatzki ring, s/p 86mm Savary dilator, Erosive duodenitis, s/p bx     Outpatient Prescriptions Prior to Visit  Medication Sig Dispense Refill  . aspirin 81 MG tablet Take 81 mg by mouth daily.        Marland Kitchen atenolol (TENORMIN) 50 MG tablet Take 1 tablet (50 mg total) by mouth daily.  90 tablet  1  . atorvastatin (LIPITOR) 20 MG tablet Take 0.5 tablets (10 mg total) by mouth daily.  90 tablet  1  . benazepril (LOTENSIN) 20 MG tablet 1/2 tab po qd  90 tablet  1  . donepezil (ARICEPT) 10 MG tablet Take 1 tablet (10 mg total) by mouth at bedtime.  90 tablet  1  . levothyroxine (SYNTHROID, LEVOTHROID) 125 MCG tablet Take 1 tablet (125 mcg total) by mouth daily before breakfast.  90 tablet  3  . niacin 500 MG CR capsule Take 2 capsules (1,000 mg total) by mouth daily.  90 capsule  0  . nitroGLYCERIN (NITROSTAT) 0.4 MG SL tablet Place 0.4 mg under the tongue every 5 (five) minutes as needed for chest pain.      . pantoprazole (PROTONIX) 40 MG tablet Take 1 tablet (40 mg total) by mouth daily before breakfast.  90 tablet  3   No facility-administered medications prior to visit.    No Known Allergies  ROS As per HPI  PE: Blood pressure 137/81, pulse 75, temperature 97.8 F (36.6 C), temperature source Temporal, resp. rate 18, height 5' 8.5" (1.74 m), weight 163 lb (73.936 kg), SpO2 99.00%. Gen: Alert, well appearing.  Patient is oriented to person, place, time, and situation. Alert, oriented to person, "Dr.'s office", and situation. MMSE today: 12/30.  LABS:  None today  IMPRESSION AND PLAN:  1) Hyperlipidemia: tolerating statine.  Due for FLP today.  2) HTN; The current medical regimen is effective;  continue present plan and medications.  3) Hypothyroidism: recent TSH's borderline low but I have been keeping med dose the same. Recheck TSH today.  4) Dementia, suspect alzheimer's type.  He is gradually declining.  I think his hearing impairment is contributing  to his problems but he absolutely declines further evaluation for this b/c "I have enough pills to take and stuff, and I don't want to deal with any kind of hearing aid or anything".  5) Prev health care: prevnar 13 IM today.  An After Visit Summary was printed and given to the patient.  FOLLOW UP: Return in about 6 months (around 04/24/2014) for 30 min f/u appt/cpe.

## 2013-10-23 NOTE — Progress Notes (Signed)
Pre visit review using our clinic review tool, if applicable. No additional management support is needed unless otherwise documented below in the visit note. 

## 2013-10-23 NOTE — Addendum Note (Signed)
Addended by: Julieta Bellini on: 10/23/2013 09:36 AM   Modules accepted: Orders

## 2013-10-24 ENCOUNTER — Encounter: Payer: Self-pay | Admitting: Family Medicine

## 2014-01-14 ENCOUNTER — Other Ambulatory Visit: Payer: Self-pay | Admitting: Family Medicine

## 2014-01-25 ENCOUNTER — Telehealth: Payer: Self-pay | Admitting: Family Medicine

## 2014-01-25 MED ORDER — ATENOLOL 50 MG PO TABS: 50.0000 mg | ORAL_TABLET | Freq: Every day | ORAL | Status: DC

## 2014-01-25 NOTE — Telephone Encounter (Signed)
Rx sent 

## 2014-01-25 NOTE — Telephone Encounter (Signed)
Atenolol [facility] [place]

## 2014-04-09 ENCOUNTER — Other Ambulatory Visit: Payer: Self-pay | Admitting: Family Medicine

## 2014-04-18 ENCOUNTER — Ambulatory Visit: Payer: Medicare Other | Admitting: Family Medicine

## 2014-04-19 ENCOUNTER — Other Ambulatory Visit: Payer: Self-pay | Admitting: Family Medicine

## 2014-04-25 ENCOUNTER — Encounter: Payer: Self-pay | Admitting: Family Medicine

## 2014-04-25 ENCOUNTER — Ambulatory Visit (INDEPENDENT_AMBULATORY_CARE_PROVIDER_SITE_OTHER): Payer: Medicare Other | Admitting: Family Medicine

## 2014-04-25 VITALS — BP 100/70 | HR 73 | Temp 98.0°F | Ht 68.5 in | Wt 163.0 lb

## 2014-04-25 DIAGNOSIS — F0280 Dementia in other diseases classified elsewhere without behavioral disturbance: Secondary | ICD-10-CM | POA: Diagnosis not present

## 2014-04-25 DIAGNOSIS — E785 Hyperlipidemia, unspecified: Secondary | ICD-10-CM | POA: Diagnosis not present

## 2014-04-25 DIAGNOSIS — I10 Essential (primary) hypertension: Secondary | ICD-10-CM

## 2014-04-25 DIAGNOSIS — E039 Hypothyroidism, unspecified: Secondary | ICD-10-CM | POA: Diagnosis not present

## 2014-04-25 DIAGNOSIS — G309 Alzheimer's disease, unspecified: Secondary | ICD-10-CM | POA: Diagnosis not present

## 2014-04-25 LAB — BASIC METABOLIC PANEL
BUN: 19 mg/dL (ref 6–23)
CO2: 30 mEq/L (ref 19–32)
Calcium: 9.7 mg/dL (ref 8.4–10.5)
Chloride: 103 mEq/L (ref 96–112)
Creatinine, Ser: 1.21 mg/dL (ref 0.40–1.50)
GFR: 60.53 mL/min (ref >60.00)
Glucose, Bld: 118 mg/dL — ABNORMAL HIGH (ref 70–99)
Potassium: 4.5 mEq/L (ref 3.5–5.1)
Sodium: 139 mEq/L (ref 135–145)

## 2014-04-25 LAB — LIPID PANEL
CHOLESTEROL: 135 mg/dL (ref 0–200)
HDL: 35.8 mg/dL — ABNORMAL LOW (ref >39.00)
LDL Cholesterol: 75 mg/dL (ref 0–99)
NonHDL: 99.2
Total CHOL/HDL Ratio: 4
Triglycerides: 123 mg/dL (ref 0.0–149.0)
VLDL: 24.6 mg/dL (ref 0.0–40.0)

## 2014-04-25 LAB — TSH: TSH: 0.45 u[IU]/mL (ref 0.35–4.50)

## 2014-04-25 NOTE — Progress Notes (Signed)
OFFICE VISIT  04/25/2014   CC:  Chief Complaint  Patient presents with  . Follow-up    6 months   HPI:    Patient is a 81 y.o. Caucasian male who presents for 6 mo f/u dementia, HTN, hyperlip, hypothyroidism. He is fasting. Compliant with all meds. He says memory is no different--feels fine, says he is functioning well.  He admits he probably could not function nearly as well w/out the help of his girlfriend/wife.  He drives only to her house, she does the shopping. He is unable to pay his bills.  His girlfriend is with him and says he recently fell between his bathroom and bedroom door (after going to bathroom), he was able to get up after scooting to the bed.  Says he hurt his ankle (twisted) and hurt his back some.  He refused to go to the doctor.  He now has no pain in ankle or back. Had another fall when he was pulling weed eater to start it and fell over after standing back up, no injury. Denies feeling any orthostatic dizziness but GF says he usually adjusts his activity to avoid this type of symptoms.  His GF thinks he is doing much better on aricept so we'll keep this going.   Past Medical History  Diagnosis Date  . ASCVD (arteriosclerotic cardiovascular disease)     2 MI's in 1990s  . Hypothyroidism   . Hyperlipidemia   . Anemia   . Thrombocytopenia     Platelets 99K on 09/2009 labs  . Esophageal dysphagia     Secondary to Schatzki's ring with dilation x2, most recently in 06/09, negative for H. pylori  . Psoriasis   . DJD (degenerative joint disease)   . Myocardial infarction 08-1996    Cardiac cath done but no other intervention that we know of  . Mild cognitive impairment with memory loss   . HTN (hypertension)   . Ulcerative proctitis     Dx: 2000, started Asacol at that time.  Was taken off asacol 2014 and has done fine since (Dr. Sydell Axon, [facility])  . GERD (gastroesophageal reflux disease)   . Dementia   . Chronic renal insufficiency, stage II  (mild)     CrCl in the 60s    Past Surgical History  Procedure Laterality Date  . Cleft lip repair      As a child  . Neck mass excision  1979  . 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 Colonoscopy  06/2007    Dr. Olegario Messier: Few diverticula, normal anastomosiss/p sigmoid colectomy for diverticulitis  . Appendectomy  1940  . Hernia repair    . Colovesicular fistula  2004  . Esophageal dilation  06-23-07; 06/28/12    H pylori NEG.  2014-reactive gastropathy with surface erosion.  . Esophagogastroduodenoscopy (egd) with esophageal dilation N/A 06/28/2012    [id number] cervical web and distal esophageal s/p dilation/Hiatal hernia. Antral erosions (reactive gastropathy, no H.pylori)  . Esophagogastroduodenoscopy  06/2007    Dr. Olegario Messier: symptomatic Schatzki ring, s/p 50mm Savary dilator, Erosive duodenitis, s/p bx     Outpatient Prescriptions Prior to Visit  Medication Sig Dispense Refill  . aspirin 81 MG tablet Take 81 mg by mouth daily.      Marland Kitchen atenolol (TENORMIN) 50 MG tablet TAKE ONE TABLET BY MOUTH ONCE DAILY 90 tablet 1  . atorvastatin (LIPITOR) 20 MG tablet Take 0.5 tablets (10  mg total) by mouth daily. 90 tablet 1  . benazepril (LOTENSIN) 20 MG tablet 1/2 tab po qd 90 tablet 1  . donepezil (ARICEPT) 10 MG tablet TAKE ONE TABLET BY MOUTH AT BEDTIME 90 tablet 1  . levothyroxine (SYNTHROID, LEVOTHROID) 125 MCG tablet TAKE ONE TABLET BY MOUTH ONCE DAILY BEFORE BREAKFAST 90 tablet 1  . niacin 500 MG CR capsule Take 2 capsules (1,000 mg total) by mouth daily. 90 capsule 0  . nitroGLYCERIN (NITROSTAT) 0.4 MG SL tablet Place 0.4 mg under the tongue every 5 (five) minutes as needed for chest pain.    . pantoprazole (PROTONIX) 40 MG tablet Take 1 tablet (40 mg total) by mouth daily before breakfast. 90 tablet 3   No facility-administered medications prior to visit.    No Known Allergies  ROS As per  HPI  PE: Blood pressure 100/70, pulse 73, temperature 98 F (36.7 C), temperature source Oral, height 5' 8.5" (1.74 m), weight 163 lb (73.936 kg), SpO2 98 %. Gen: Alert, well appearing.  Patient is oriented to person, place, time, and situation. AFFECT: pleasant, lucid thought and speech. Hard of hearing. CV: RRR, 2/6 syst murmur at cardiac base, S1 and S2 normal, no diastolic murmur, no rub or gallop. Chest is clear, no wheezing or rales. Normal symmetric air entry throughout both lung fields. No chest wall deformities or tenderness. EXT: no clubbing, cyanosis, or edema.   LABS:  Lab Results  Component Value Date   CHOL 119 10/23/2013   HDL 33.40* 10/23/2013   LDLCALC 70 10/23/2013   TRIG 76.0 10/23/2013   CHOLHDL 4 10/23/2013     Chemistry      Component Value Date/Time   NA 139 10/23/2013 0925   K 3.8 10/23/2013 0925   CL 105 10/23/2013 0925   CO2 25 10/23/2013 0925   BUN 16 10/23/2013 0925   CREATININE 1.1 10/23/2013 0925      Component Value Date/Time   CALCIUM 9.6 10/23/2013 0925   ALKPHOS 78 04/05/2013 1030   AST 24 04/05/2013 1030   ALT 20 04/05/2013 1030   BILITOT 0.7 04/05/2013 1030     Lab Results  Component Value Date   TSH 0.29* 10/23/2013   Lab Results  Component Value Date   WBC 4.8 09/11/2009   HGB 12.2 09/11/2009   HCT 35.5 09/11/2009   MCV 100.6 09/11/2009   PLT 89 09/11/2009    IMPRESSION AND PLAN:  1) Dementia, alzheimers: The current medical regimen is effective;  continue present plan and medications.  2) HTN:The current medical regimen is effective;  continue present plan and medications.  3) Hyperlipidemia: The current medical regimen is effective;  continue present plan and medications.  4) Hypothyroidism: TSH's have been a tiny bit low but we've been continuing same dose. Recheck TSH today.  BMET and FLP today as well.  5) Recent falls x 2; these sound like he gets orthostatic dizziness and occ gets enough off-balance to  fall. No injuries.  I asked him if he would allow [facility] to come to his house to assess for fall risk/safety assessment, maybe even assist with ensuring med compliance.  He declined this.  An After Visit Summary was printed and given to the patient.  FOLLOW UP: Return in about 6 months (around 10/25/2014) for routine chronic illness f/u.

## 2014-04-25 NOTE — Progress Notes (Signed)
Pre visit review using our clinic review tool, if applicable. No additional management support is needed unless otherwise documented below in the visit note. 

## 2014-05-31 ENCOUNTER — Telehealth: Payer: Self-pay | Admitting: Family Medicine

## 2014-05-31 NOTE — Telephone Encounter (Signed)
OK, I'll enter orders.

## 2014-05-31 NOTE — Telephone Encounter (Signed)
Patient's friend, Lu called requesting some home health care evaluation.  Patient seems to be deteriorating and Lu believes he needs some assistance ( patient doesn't know that this assessment will be going on so pt's friend request that they be present at time of assessment).  Please advise.

## 2014-06-03 ENCOUNTER — Other Ambulatory Visit: Payer: Self-pay | Admitting: Family Medicine

## 2014-06-03 DIAGNOSIS — F0390 Unspecified dementia without behavioral disturbance: Secondary | ICD-10-CM

## 2014-06-04 ENCOUNTER — Telehealth: Payer: Self-pay | Admitting: Family Medicine

## 2014-06-04 NOTE — Telephone Encounter (Signed)
Please call Raymond [name] in regards to the Home Visit assessment form for [name].

## 2014-06-04 NOTE — Telephone Encounter (Signed)
Left message.  Already open phone note.

## 2014-06-05 ENCOUNTER — Telehealth: Payer: Self-pay

## 2014-06-05 NOTE — Telephone Encounter (Signed)
FYI- Nurse from [facility] called and said that he seemed fine/independent and didn't think that he needs homehealth care unless there is another reason why?

## 2014-06-13 DIAGNOSIS — R1314 Dysphagia, pharyngoesophageal phase: Secondary | ICD-10-CM | POA: Diagnosis not present

## 2014-06-13 DIAGNOSIS — D649 Anemia, unspecified: Secondary | ICD-10-CM | POA: Diagnosis not present

## 2014-06-13 DIAGNOSIS — I2510 Atherosclerotic heart disease of native coronary artery without angina pectoris: Secondary | ICD-10-CM | POA: Diagnosis not present

## 2014-06-13 DIAGNOSIS — I10 Essential (primary) hypertension: Secondary | ICD-10-CM | POA: Diagnosis not present

## 2014-06-13 DIAGNOSIS — I252 Old myocardial infarction: Secondary | ICD-10-CM | POA: Diagnosis not present

## 2014-06-13 DIAGNOSIS — K219 Gastro-esophageal reflux disease without esophagitis: Secondary | ICD-10-CM | POA: Diagnosis not present

## 2014-06-13 DIAGNOSIS — M150 Primary generalized (osteo)arthritis: Secondary | ICD-10-CM | POA: Diagnosis not present

## 2014-06-13 DIAGNOSIS — F0390 Unspecified dementia without behavioral disturbance: Secondary | ICD-10-CM | POA: Diagnosis not present

## 2014-06-18 ENCOUNTER — Telehealth: Payer: Self-pay | Admitting: *Deleted

## 2014-06-18 DIAGNOSIS — I10 Essential (primary) hypertension: Secondary | ICD-10-CM | POA: Diagnosis not present

## 2014-06-18 DIAGNOSIS — M150 Primary generalized (osteo)arthritis: Secondary | ICD-10-CM | POA: Diagnosis not present

## 2014-06-18 DIAGNOSIS — R1314 Dysphagia, pharyngoesophageal phase: Secondary | ICD-10-CM | POA: Diagnosis not present

## 2014-06-18 DIAGNOSIS — I252 Old myocardial infarction: Secondary | ICD-10-CM | POA: Diagnosis not present

## 2014-06-18 DIAGNOSIS — I2510 Atherosclerotic heart disease of native coronary artery without angina pectoris: Secondary | ICD-10-CM | POA: Diagnosis not present

## 2014-06-18 DIAGNOSIS — F0390 Unspecified dementia without behavioral disturbance: Secondary | ICD-10-CM | POA: Diagnosis not present

## 2014-06-18 NOTE — Telephone Encounter (Signed)
Debbie with [facility] called wanting a verbal order for PT 2 days a week x 3 weeks. Please advise. Thanks.

## 2014-06-19 DIAGNOSIS — I252 Old myocardial infarction: Secondary | ICD-10-CM | POA: Diagnosis not present

## 2014-06-19 DIAGNOSIS — M150 Primary generalized (osteo)arthritis: Secondary | ICD-10-CM | POA: Diagnosis not present

## 2014-06-19 DIAGNOSIS — R1314 Dysphagia, pharyngoesophageal phase: Secondary | ICD-10-CM | POA: Diagnosis not present

## 2014-06-19 DIAGNOSIS — I2510 Atherosclerotic heart disease of native coronary artery without angina pectoris: Secondary | ICD-10-CM | POA: Diagnosis not present

## 2014-06-19 DIAGNOSIS — F0390 Unspecified dementia without behavioral disturbance: Secondary | ICD-10-CM | POA: Diagnosis not present

## 2014-06-19 DIAGNOSIS — I10 Essential (primary) hypertension: Secondary | ICD-10-CM | POA: Diagnosis not present

## 2014-06-19 NOTE — Telephone Encounter (Signed)
This is fine 

## 2014-06-19 NOTE — Telephone Encounter (Signed)
Left message on Debbies vm advising.

## 2014-06-20 DIAGNOSIS — R1314 Dysphagia, pharyngoesophageal phase: Secondary | ICD-10-CM | POA: Diagnosis not present

## 2014-06-20 DIAGNOSIS — F0390 Unspecified dementia without behavioral disturbance: Secondary | ICD-10-CM | POA: Diagnosis not present

## 2014-06-20 DIAGNOSIS — I2510 Atherosclerotic heart disease of native coronary artery without angina pectoris: Secondary | ICD-10-CM | POA: Diagnosis not present

## 2014-06-20 DIAGNOSIS — I252 Old myocardial infarction: Secondary | ICD-10-CM | POA: Diagnosis not present

## 2014-06-20 DIAGNOSIS — I10 Essential (primary) hypertension: Secondary | ICD-10-CM | POA: Diagnosis not present

## 2014-06-20 DIAGNOSIS — M150 Primary generalized (osteo)arthritis: Secondary | ICD-10-CM | POA: Diagnosis not present

## 2014-06-21 DIAGNOSIS — I10 Essential (primary) hypertension: Secondary | ICD-10-CM | POA: Diagnosis not present

## 2014-06-21 DIAGNOSIS — F0390 Unspecified dementia without behavioral disturbance: Secondary | ICD-10-CM | POA: Diagnosis not present

## 2014-06-21 DIAGNOSIS — R1314 Dysphagia, pharyngoesophageal phase: Secondary | ICD-10-CM | POA: Diagnosis not present

## 2014-06-21 DIAGNOSIS — I2510 Atherosclerotic heart disease of native coronary artery without angina pectoris: Secondary | ICD-10-CM | POA: Diagnosis not present

## 2014-06-21 DIAGNOSIS — I252 Old myocardial infarction: Secondary | ICD-10-CM | POA: Diagnosis not present

## 2014-06-21 DIAGNOSIS — M150 Primary generalized (osteo)arthritis: Secondary | ICD-10-CM | POA: Diagnosis not present

## 2014-06-24 DIAGNOSIS — F0390 Unspecified dementia without behavioral disturbance: Secondary | ICD-10-CM | POA: Diagnosis not present

## 2014-06-24 DIAGNOSIS — I252 Old myocardial infarction: Secondary | ICD-10-CM | POA: Diagnosis not present

## 2014-06-24 DIAGNOSIS — I10 Essential (primary) hypertension: Secondary | ICD-10-CM | POA: Diagnosis not present

## 2014-06-24 DIAGNOSIS — I2510 Atherosclerotic heart disease of native coronary artery without angina pectoris: Secondary | ICD-10-CM | POA: Diagnosis not present

## 2014-06-24 DIAGNOSIS — R1314 Dysphagia, pharyngoesophageal phase: Secondary | ICD-10-CM | POA: Diagnosis not present

## 2014-06-24 DIAGNOSIS — M150 Primary generalized (osteo)arthritis: Secondary | ICD-10-CM | POA: Diagnosis not present

## 2014-06-25 DIAGNOSIS — I10 Essential (primary) hypertension: Secondary | ICD-10-CM | POA: Diagnosis not present

## 2014-06-25 DIAGNOSIS — F0390 Unspecified dementia without behavioral disturbance: Secondary | ICD-10-CM | POA: Diagnosis not present

## 2014-06-25 DIAGNOSIS — M150 Primary generalized (osteo)arthritis: Secondary | ICD-10-CM | POA: Diagnosis not present

## 2014-06-25 DIAGNOSIS — R1314 Dysphagia, pharyngoesophageal phase: Secondary | ICD-10-CM | POA: Diagnosis not present

## 2014-06-25 DIAGNOSIS — I2510 Atherosclerotic heart disease of native coronary artery without angina pectoris: Secondary | ICD-10-CM | POA: Diagnosis not present

## 2014-06-25 DIAGNOSIS — I252 Old myocardial infarction: Secondary | ICD-10-CM | POA: Diagnosis not present

## 2014-06-26 ENCOUNTER — Other Ambulatory Visit: Payer: Self-pay | Admitting: *Deleted

## 2014-06-26 ENCOUNTER — Encounter: Payer: Self-pay | Admitting: Family Medicine

## 2014-06-26 DIAGNOSIS — I252 Old myocardial infarction: Secondary | ICD-10-CM | POA: Diagnosis not present

## 2014-06-26 DIAGNOSIS — R1314 Dysphagia, pharyngoesophageal phase: Secondary | ICD-10-CM | POA: Diagnosis not present

## 2014-06-26 DIAGNOSIS — I10 Essential (primary) hypertension: Secondary | ICD-10-CM | POA: Diagnosis not present

## 2014-06-26 DIAGNOSIS — I2510 Atherosclerotic heart disease of native coronary artery without angina pectoris: Secondary | ICD-10-CM | POA: Diagnosis not present

## 2014-06-26 DIAGNOSIS — M150 Primary generalized (osteo)arthritis: Secondary | ICD-10-CM | POA: Diagnosis not present

## 2014-06-26 DIAGNOSIS — F0390 Unspecified dementia without behavioral disturbance: Secondary | ICD-10-CM | POA: Diagnosis not present

## 2014-06-26 MED ORDER — NITROGLYCERIN 0.4 MG SL SUBL: 0.4000 mg | SUBLINGUAL_TABLET | SUBLINGUAL | Status: AC | PRN

## 2014-07-01 DIAGNOSIS — I10 Essential (primary) hypertension: Secondary | ICD-10-CM | POA: Diagnosis not present

## 2014-07-01 DIAGNOSIS — R1314 Dysphagia, pharyngoesophageal phase: Secondary | ICD-10-CM | POA: Diagnosis not present

## 2014-07-01 DIAGNOSIS — I2510 Atherosclerotic heart disease of native coronary artery without angina pectoris: Secondary | ICD-10-CM | POA: Diagnosis not present

## 2014-07-01 DIAGNOSIS — F0390 Unspecified dementia without behavioral disturbance: Secondary | ICD-10-CM | POA: Diagnosis not present

## 2014-07-01 DIAGNOSIS — I252 Old myocardial infarction: Secondary | ICD-10-CM | POA: Diagnosis not present

## 2014-07-01 DIAGNOSIS — M150 Primary generalized (osteo)arthritis: Secondary | ICD-10-CM | POA: Diagnosis not present

## 2014-07-02 DIAGNOSIS — F0390 Unspecified dementia without behavioral disturbance: Secondary | ICD-10-CM | POA: Diagnosis not present

## 2014-07-02 DIAGNOSIS — I10 Essential (primary) hypertension: Secondary | ICD-10-CM | POA: Diagnosis not present

## 2014-07-02 DIAGNOSIS — R1314 Dysphagia, pharyngoesophageal phase: Secondary | ICD-10-CM | POA: Diagnosis not present

## 2014-07-02 DIAGNOSIS — I2510 Atherosclerotic heart disease of native coronary artery without angina pectoris: Secondary | ICD-10-CM | POA: Diagnosis not present

## 2014-07-02 DIAGNOSIS — M150 Primary generalized (osteo)arthritis: Secondary | ICD-10-CM | POA: Diagnosis not present

## 2014-07-02 DIAGNOSIS — I252 Old myocardial infarction: Secondary | ICD-10-CM | POA: Diagnosis not present

## 2014-07-03 DIAGNOSIS — I2510 Atherosclerotic heart disease of native coronary artery without angina pectoris: Secondary | ICD-10-CM | POA: Diagnosis not present

## 2014-07-03 DIAGNOSIS — I10 Essential (primary) hypertension: Secondary | ICD-10-CM | POA: Diagnosis not present

## 2014-07-03 DIAGNOSIS — F0390 Unspecified dementia without behavioral disturbance: Secondary | ICD-10-CM | POA: Diagnosis not present

## 2014-07-03 DIAGNOSIS — I252 Old myocardial infarction: Secondary | ICD-10-CM | POA: Diagnosis not present

## 2014-07-03 DIAGNOSIS — M150 Primary generalized (osteo)arthritis: Secondary | ICD-10-CM | POA: Diagnosis not present

## 2014-07-03 DIAGNOSIS — R1314 Dysphagia, pharyngoesophageal phase: Secondary | ICD-10-CM | POA: Diagnosis not present

## 2014-07-04 DIAGNOSIS — I252 Old myocardial infarction: Secondary | ICD-10-CM | POA: Diagnosis not present

## 2014-07-04 DIAGNOSIS — M150 Primary generalized (osteo)arthritis: Secondary | ICD-10-CM | POA: Diagnosis not present

## 2014-07-04 DIAGNOSIS — R1314 Dysphagia, pharyngoesophageal phase: Secondary | ICD-10-CM | POA: Diagnosis not present

## 2014-07-04 DIAGNOSIS — I2510 Atherosclerotic heart disease of native coronary artery without angina pectoris: Secondary | ICD-10-CM | POA: Diagnosis not present

## 2014-07-04 DIAGNOSIS — I10 Essential (primary) hypertension: Secondary | ICD-10-CM | POA: Diagnosis not present

## 2014-07-04 DIAGNOSIS — F0390 Unspecified dementia without behavioral disturbance: Secondary | ICD-10-CM | POA: Diagnosis not present

## 2014-07-07 DIAGNOSIS — R1314 Dysphagia, pharyngoesophageal phase: Secondary | ICD-10-CM | POA: Diagnosis not present

## 2014-07-07 DIAGNOSIS — I2510 Atherosclerotic heart disease of native coronary artery without angina pectoris: Secondary | ICD-10-CM | POA: Diagnosis not present

## 2014-07-07 DIAGNOSIS — F0390 Unspecified dementia without behavioral disturbance: Secondary | ICD-10-CM | POA: Diagnosis not present

## 2014-07-07 DIAGNOSIS — I10 Essential (primary) hypertension: Secondary | ICD-10-CM | POA: Diagnosis not present

## 2014-07-14 ENCOUNTER — Encounter: Payer: Self-pay | Admitting: Family Medicine

## 2014-07-16 ENCOUNTER — Other Ambulatory Visit: Payer: Self-pay | Admitting: Family Medicine

## 2014-07-16 MED ORDER — BENAZEPRIL HCL 20 MG PO TABS: ORAL_TABLET | ORAL | Status: DC

## 2014-07-18 ENCOUNTER — Telehealth: Payer: Self-pay | Admitting: Family Medicine

## 2014-07-18 ENCOUNTER — Other Ambulatory Visit: Payer: Self-pay | Admitting: Family Medicine

## 2014-07-18 DIAGNOSIS — R1314 Dysphagia, pharyngoesophageal phase: Secondary | ICD-10-CM | POA: Diagnosis not present

## 2014-07-18 DIAGNOSIS — I252 Old myocardial infarction: Secondary | ICD-10-CM | POA: Diagnosis not present

## 2014-07-18 DIAGNOSIS — M150 Primary generalized (osteo)arthritis: Secondary | ICD-10-CM | POA: Diagnosis not present

## 2014-07-18 DIAGNOSIS — I2510 Atherosclerotic heart disease of native coronary artery without angina pectoris: Secondary | ICD-10-CM | POA: Diagnosis not present

## 2014-07-18 DIAGNOSIS — F0390 Unspecified dementia without behavioral disturbance: Secondary | ICD-10-CM | POA: Diagnosis not present

## 2014-07-18 DIAGNOSIS — I10 Essential (primary) hypertension: Secondary | ICD-10-CM | POA: Diagnosis not present

## 2014-07-18 NOTE — Telephone Encounter (Signed)
OK. Discontinue atenolol.

## 2014-07-18 NOTE — Telephone Encounter (Signed)
Raymond [name] from [facility]. She wanted Dr. Anitra Lauth to know that Raymond [name] is having Orthostatic B/P. His B/P when sitting is 110/60 and 98/60 when standing. He has a HX of falls, he is not drinking water. Due to diementia status they are unclear how to change that habit. They are requesting his B/P medication dose be decreased.

## 2014-07-18 NOTE — Telephone Encounter (Signed)
Raymond [name] advised and voiced understanding.  

## 2014-07-18 NOTE — Telephone Encounter (Signed)
Please advise. Thanks.  

## 2014-07-18 NOTE — Telephone Encounter (Signed)
RF request for atorvastatin.  LOV: 04/25/14 Next ov: 10/15/14 Last written: 04/24/14 #90 w/ 1RF

## 2014-08-07 ENCOUNTER — Telehealth: Payer: Self-pay | Admitting: Family Medicine

## 2014-08-07 DIAGNOSIS — R1314 Dysphagia, pharyngoesophageal phase: Secondary | ICD-10-CM | POA: Diagnosis not present

## 2014-08-07 DIAGNOSIS — I10 Essential (primary) hypertension: Secondary | ICD-10-CM | POA: Diagnosis not present

## 2014-08-07 DIAGNOSIS — M150 Primary generalized (osteo)arthritis: Secondary | ICD-10-CM | POA: Diagnosis not present

## 2014-08-07 DIAGNOSIS — I252 Old myocardial infarction: Secondary | ICD-10-CM | POA: Diagnosis not present

## 2014-08-07 DIAGNOSIS — I2510 Atherosclerotic heart disease of native coronary artery without angina pectoris: Secondary | ICD-10-CM | POA: Diagnosis not present

## 2014-08-07 DIAGNOSIS — F0390 Unspecified dementia without behavioral disturbance: Secondary | ICD-10-CM | POA: Diagnosis not present

## 2014-08-07 NOTE — Telephone Encounter (Signed)
[facility] nurse has a reportable find for [facility]. His BP sitting is 108/60 and standing it is 82/44. He is currently taking Nabenzaetril 10mg . [facility] has also left a voicemail on CMA's phone with morwe information.

## 2014-08-07 NOTE — Telephone Encounter (Signed)
Raymond [name] with [facility] advised and voiced understanding.

## 2014-08-07 NOTE — Telephone Encounter (Signed)
D/C benazapril (his bp med). OK to give verbal order to continue services.-thx

## 2014-08-07 NOTE — Telephone Encounter (Addendum)
See message below.  Also Verdis Frederickson with [facility] at 12:51pm stating that she would like a verbal okay to recertifie pt into their care for multiple falls, orthostatic BP and anemia. Please advise. CB: [phone]

## 2014-08-12 DIAGNOSIS — I2510 Atherosclerotic heart disease of native coronary artery without angina pectoris: Secondary | ICD-10-CM | POA: Diagnosis not present

## 2014-08-12 DIAGNOSIS — D649 Anemia, unspecified: Secondary | ICD-10-CM | POA: Diagnosis not present

## 2014-08-12 DIAGNOSIS — I252 Old myocardial infarction: Secondary | ICD-10-CM | POA: Diagnosis not present

## 2014-08-12 DIAGNOSIS — M150 Primary generalized (osteo)arthritis: Secondary | ICD-10-CM | POA: Diagnosis not present

## 2014-08-12 DIAGNOSIS — I10 Essential (primary) hypertension: Secondary | ICD-10-CM | POA: Diagnosis not present

## 2014-08-12 DIAGNOSIS — F0390 Unspecified dementia without behavioral disturbance: Secondary | ICD-10-CM | POA: Diagnosis not present

## 2014-08-12 DIAGNOSIS — K219 Gastro-esophageal reflux disease without esophagitis: Secondary | ICD-10-CM | POA: Diagnosis not present

## 2014-08-12 DIAGNOSIS — R1314 Dysphagia, pharyngoesophageal phase: Secondary | ICD-10-CM | POA: Diagnosis not present

## 2014-08-13 DIAGNOSIS — I10 Essential (primary) hypertension: Secondary | ICD-10-CM | POA: Diagnosis not present

## 2014-08-13 DIAGNOSIS — M150 Primary generalized (osteo)arthritis: Secondary | ICD-10-CM | POA: Diagnosis not present

## 2014-08-13 DIAGNOSIS — I2510 Atherosclerotic heart disease of native coronary artery without angina pectoris: Secondary | ICD-10-CM | POA: Diagnosis not present

## 2014-08-13 DIAGNOSIS — R1314 Dysphagia, pharyngoesophageal phase: Secondary | ICD-10-CM | POA: Diagnosis not present

## 2014-08-13 DIAGNOSIS — F0390 Unspecified dementia without behavioral disturbance: Secondary | ICD-10-CM | POA: Diagnosis not present

## 2014-08-13 DIAGNOSIS — I252 Old myocardial infarction: Secondary | ICD-10-CM | POA: Diagnosis not present

## 2014-08-21 ENCOUNTER — Telehealth: Payer: Self-pay | Admitting: *Deleted

## 2014-08-21 DIAGNOSIS — I252 Old myocardial infarction: Secondary | ICD-10-CM | POA: Diagnosis not present

## 2014-08-21 DIAGNOSIS — I2510 Atherosclerotic heart disease of native coronary artery without angina pectoris: Secondary | ICD-10-CM | POA: Diagnosis not present

## 2014-08-21 DIAGNOSIS — R1314 Dysphagia, pharyngoesophageal phase: Secondary | ICD-10-CM | POA: Diagnosis not present

## 2014-08-21 DIAGNOSIS — M150 Primary generalized (osteo)arthritis: Secondary | ICD-10-CM | POA: Diagnosis not present

## 2014-08-21 DIAGNOSIS — F0390 Unspecified dementia without behavioral disturbance: Secondary | ICD-10-CM | POA: Diagnosis not present

## 2014-08-21 DIAGNOSIS — I10 Essential (primary) hypertension: Secondary | ICD-10-CM | POA: Diagnosis not present

## 2014-08-21 NOTE — Telephone Encounter (Signed)
Noted  

## 2014-08-21 NOTE — Telephone Encounter (Signed)
FYIVerdis Frederickson with [facility] on 08/21/14 at 9:55am stating that they have been checking pts BP. His sitting BP was 116/60 and his standing BP was 90/60 today. She stated that he is not on any antihypertensive medications at this time and has advised pt to try to increase water intake. She stated this call was to just inform Dr. Anitra Lauth of pts BP readings today.

## 2014-08-23 DIAGNOSIS — I10 Essential (primary) hypertension: Secondary | ICD-10-CM | POA: Diagnosis not present

## 2014-08-30 DIAGNOSIS — R1314 Dysphagia, pharyngoesophageal phase: Secondary | ICD-10-CM | POA: Diagnosis not present

## 2014-08-30 DIAGNOSIS — F0390 Unspecified dementia without behavioral disturbance: Secondary | ICD-10-CM | POA: Diagnosis not present

## 2014-08-30 DIAGNOSIS — I10 Essential (primary) hypertension: Secondary | ICD-10-CM | POA: Diagnosis not present

## 2014-08-30 DIAGNOSIS — I2510 Atherosclerotic heart disease of native coronary artery without angina pectoris: Secondary | ICD-10-CM | POA: Diagnosis not present

## 2014-08-30 DIAGNOSIS — M150 Primary generalized (osteo)arthritis: Secondary | ICD-10-CM | POA: Diagnosis not present

## 2014-08-30 DIAGNOSIS — I252 Old myocardial infarction: Secondary | ICD-10-CM | POA: Diagnosis not present

## 2014-09-09 ENCOUNTER — Encounter: Payer: Self-pay | Admitting: Family Medicine

## 2014-09-09 ENCOUNTER — Ambulatory Visit (INDEPENDENT_AMBULATORY_CARE_PROVIDER_SITE_OTHER): Payer: Medicare Other | Admitting: Family Medicine

## 2014-09-09 VITALS — BP 90/62 | HR 94 | Temp 97.8°F | Wt 158.0 lb

## 2014-09-09 DIAGNOSIS — L409 Psoriasis, unspecified: Secondary | ICD-10-CM | POA: Diagnosis not present

## 2014-09-09 DIAGNOSIS — E039 Hypothyroidism, unspecified: Secondary | ICD-10-CM

## 2014-09-09 DIAGNOSIS — R5383 Other fatigue: Secondary | ICD-10-CM | POA: Insufficient documentation

## 2014-09-09 DIAGNOSIS — R5382 Chronic fatigue, unspecified: Secondary | ICD-10-CM

## 2014-09-09 MED ORDER — TRIAMCINOLONE ACETONIDE 0.1 % EX CREA: 1.0000 "application " | TOPICAL_CREAM | Freq: Two times a day (BID) | CUTANEOUS | Status: DC

## 2014-09-09 NOTE — Patient Instructions (Signed)

## 2014-09-09 NOTE — Progress Notes (Signed)
Subjective:    Patient ID: Raymond [name], male    DOB: [birth year], 81 y.o.   MRN: [id number]  HPI  Fatigue: Patient presents today with his male friend, secondary to concerns from her that he is more tired than usual. He reports that he feels well, does not feel like he is more tired than usual, does not feel he is more depressed, anxious or moody than his usual. He states he is eating well, has a good appetite. He has had no major weight loss, and weighs 158 today. This is down approximately 2 pounds from his weight 6 months ago. He reports his neighbor is now cut his grass, and he does have some anxious feelings surrounding that has he doesn't want his neighbor to have to do that for him. He states he doesn't want to take advantage of him. Patient does have some history of memory loss/dementia. He currently does live alone, with his friend and home health agency checking on him daily. His friend states that she feels he is more depressed and easily upset over matters that didn't usually affect him. She feels he staying in bed later in the morning than he usually does. She states that he is having nightmares, he denies that he is having nightmares.  Psoriasis: Patient has plaque-like psoriasis over his bilateral elbows, and states he would like something help. He does not have any creams at home, and has not been applying any medications.  Past Medical History  Diagnosis Date  . ASCVD (arteriosclerotic cardiovascular disease)     2 MI's in 1990s  . Hypothyroidism   . Hyperlipidemia   . Anemia   . Thrombocytopenia     Platelets 99K on 09/2009 labs  . Esophageal dysphagia     Secondary to Schatzki's ring with dilation x2, most recently in 06/09, negative for H. pylori  . Psoriasis   . DJD (degenerative joint disease)   . Myocardial infarction 08-1996    Cardiac cath done but no other intervention that we know of  . Mild cognitive impairment with memory loss   . HTN (hypertension)   .  Ulcerative proctitis     Dx: 2000, started Asacol at that time.  Was taken off asacol 2014 and has done fine since (Dr. Sydell Axon, [facility])  . GERD (gastroesophageal reflux disease)   . Dementia   . Chronic renal insufficiency, stage II (mild)     CrCl in the 60s   No Known Allergies Past Surgical History  Procedure Laterality Date  . Cleft lip repair      As a child  . Neck mass excision  1979  . 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 Colonoscopy  06/2007    Dr. Olegario Messier: Few diverticula, normal anastomosiss/p sigmoid colectomy for diverticulitis  . Appendectomy  1940  . Hernia repair    . Colovesicular fistula  2004  . Esophageal dilation  06-23-07; 06/28/12    H pylori NEG.  2014-reactive gastropathy with surface erosion.  . Esophagogastroduodenoscopy (egd) with esophageal dilation N/A 06/28/2012    [id number] cervical web and distal esophageal s/p dilation/Hiatal hernia. Antral erosions (reactive gastropathy, no H.pylori)  . Esophagogastroduodenoscopy  06/2007    Dr. Olegario Messier: symptomatic Schatzki ring, s/p 58mm Savary dilator, Erosive duodenitis, s/p bx    Social History   Social History  . Marital Status: Widowed    Spouse Name: N/A  .  Number of Children: N/A  . Years of Education: N/A   Occupational History  . Retired from [employer]  . Smoking status: Former Smoker -- 1.00 packs/day for 10 years    Types: Cigarettes    Quit date: 01/11/1970  . Smokeless tobacco: Never Used     Comment: Minimal use at 20  . Alcohol Use: No  . Drug Use: No  . Sexual Activity: Not Currently   Other Topics Concern  . Not on file   Social History Narrative   Widowed, has had girlfriend x 30 yrs, has 1 son.  Lives in [place], son lives in [place].   Occupation: [employer] in [place].  Retired 107s.   No regular exercise.  Smoked in the WAY distant  past.   Alcohol: none.     Review of Systems  Constitutional: Positive for activity change and fatigue. Negative for fever, chills, diaphoresis, appetite change and unexpected weight change.  Gastrointestinal: Negative.  Negative for nausea, vomiting, abdominal pain, diarrhea and blood in stool.  Genitourinary: Negative for dysuria, frequency, decreased urine volume and difficulty urinating.  Neurological: Negative for syncope and weakness.  Psychiatric/Behavioral: Positive for confusion. Negative for hallucinations, behavioral problems and agitation. The patient is not nervous/anxious and is not hyperactive.        Objective:   Physical Exam BP 90/62 mmHg  Pulse 94  Temp(Src) 97.8 F (36.6 C) (Oral)  Wt 158 lb (71.668 kg)  SpO2 95% Gen: Afebrile. No acute distress. Nontoxic in appearance, well-developed, well-nourished Caucasian gentleman. Pleasant. Talkative, with obvious cognitive deficit.  HENT: AT. [place]. Tacky mucous membranes. Eyes:Pupils Equal Round Reactive to light, Extraocular movements intact,  Conjunctiva without redness, discharge or icterus. CV: RRR, no edema Chest: CTAB, no wheeze or crackles Psych: Normal dress and demeanor. Normal speech. Cognitive deficit.     Assessment & Plan:  1. Psoriasis - triamcinolone cream (KENALOG) 0.1 %; Apply 1 application topically 2 (two) times daily.  Dispense: 30 g; Refill: 2 - Follow-up when necessary  2. Hypothyroidism, unspecified hypothyroidism type - Patient currently on Synthroid, takes medications daily. Will check thyroid today with increased fatigue. - TSH  3. Fatigue - Labs today for increasing fatigue, patient denies feeling any changes, however family seems to be concerned. PHQ negative. AVS on fatigue.  - COMPLETE METABOLIC PANEL WITH GFR - CBC w/Diff - Patient's friend will be contacted once the results become available. If no improvement patient should be seen by PCP for further evaluation.

## 2014-09-10 ENCOUNTER — Other Ambulatory Visit: Payer: Self-pay | Admitting: *Deleted

## 2014-09-10 ENCOUNTER — Encounter: Payer: Self-pay | Admitting: Family Medicine

## 2014-09-10 LAB — COMPLETE METABOLIC PANEL WITH GFR
ALT: 12 U/L (ref 9–46)
AST: 17 U/L (ref 10–35)
Albumin: 4.1 g/dL (ref 3.6–5.1)
Alkaline Phosphatase: 88 U/L (ref 40–115)
BUN: 17 mg/dL (ref 7–25)
CHLORIDE: 102 mmol/L (ref 98–110)
CO2: 27 mmol/L (ref 20–31)
Calcium: 9.4 mg/dL (ref 8.6–10.3)
Creat: 1.01 mg/dL (ref 0.70–1.11)
GFR, EST NON AFRICAN AMERICAN: 68 mL/min (ref >=60)
GFR, Est African American: 78 mL/min (ref >=60)
GLUCOSE: 112 mg/dL — AB (ref 65–99)
POTASSIUM: 4.4 mmol/L (ref 3.5–5.3)
SODIUM: 141 mmol/L (ref 135–146)
TOTAL PROTEIN: 6.9 g/dL (ref 6.1–8.1)
Total Bilirubin: 0.5 mg/dL (ref 0.2–1.2)

## 2014-09-10 LAB — CBC WITH DIFFERENTIAL/PLATELET
BASOS PCT: 0.2 % (ref 0.0–3.0)
Basophils Absolute: 0 10*3/uL (ref 0.0–0.1)
EOS PCT: 1.4 % (ref 0.0–5.0)
Eosinophils Absolute: 0.1 10*3/uL (ref 0.0–0.7)
HEMATOCRIT: 37.2 % — AB (ref 39.0–52.0)
Hemoglobin: 12.3 g/dL — ABNORMAL LOW (ref 13.0–17.0)
LYMPHS ABS: 0.7 10*3/uL (ref 0.7–4.0)
LYMPHS PCT: 11.8 % — AB (ref 12.0–46.0)
MCHC: 33 g/dL (ref 30.0–36.0)
MCV: 95.6 fl (ref 78.0–100.0)
MONOS PCT: 8.8 % (ref 3.0–12.0)
Monocytes Absolute: 0.5 10*3/uL (ref 0.1–1.0)
NEUTROS ABS: 4.6 10*3/uL (ref 1.4–7.7)
Neutrophils Relative %: 77.8 % — ABNORMAL HIGH (ref 43.0–77.0)
PLATELETS: 112 10*3/uL — AB (ref 150.0–400.0)
RBC: 3.89 Mil/uL — ABNORMAL LOW (ref 4.22–5.81)
RDW: 14.1 % (ref 11.5–15.5)
WBC: 5.9 10*3/uL (ref 4.0–10.5)

## 2014-09-10 LAB — TSH: TSH: 0.3 u[IU]/mL — ABNORMAL LOW (ref 0.35–4.50)

## 2014-09-11 ENCOUNTER — Telehealth: Payer: Self-pay | Admitting: *Deleted

## 2014-09-11 ENCOUNTER — Other Ambulatory Visit: Payer: Self-pay | Admitting: Family Medicine

## 2014-09-11 ENCOUNTER — Telehealth: Payer: Self-pay | Admitting: Family Medicine

## 2014-09-11 DIAGNOSIS — E039 Hypothyroidism, unspecified: Secondary | ICD-10-CM

## 2014-09-11 MED ORDER — LEVOTHYROXINE SODIUM 112 MCG PO TABS: ORAL_TABLET | ORAL | Status: DC

## 2014-09-11 NOTE — Telephone Encounter (Signed)
If patient, or patient's friend [name] returns my phone call, please make them aware of below message. Left detailed message on patient's answering machine today. Patient's lab work was basically normal, with the exception of mildly over replaced thyroid. I have called in a 112 g dose of levothyroxine, to replace his 125 g dose. He will need to have a lab draw in 8-12 weeks to have repeat thyroid studies. This order has been placed.

## 2014-09-11 NOTE — Telephone Encounter (Signed)
Patient's friend [name] returned call reviewed lab results and instructions with her for reduced levothyroxine and also repeat labs in 8-12 weeks . She verbalized understanding .

## 2014-09-17 ENCOUNTER — Other Ambulatory Visit: Payer: Self-pay | Admitting: Gastroenterology

## 2014-09-18 NOTE — Telephone Encounter (Signed)
Please notify the patient they have not been seen in over 2 years and will need a follow-up visit for further refills. I will call in a refill to last a couple months until she can be scheduled.

## 2014-09-18 NOTE — Telephone Encounter (Signed)
Pt has an appointment on 10/04/14 @ 10:00 with [facility]

## 2014-09-20 ENCOUNTER — Encounter: Payer: Self-pay | Admitting: Family Medicine

## 2014-09-20 ENCOUNTER — Telehealth: Payer: Self-pay | Admitting: *Deleted

## 2014-09-20 DIAGNOSIS — M150 Primary generalized (osteo)arthritis: Secondary | ICD-10-CM | POA: Diagnosis not present

## 2014-09-20 DIAGNOSIS — I252 Old myocardial infarction: Secondary | ICD-10-CM | POA: Diagnosis not present

## 2014-09-20 DIAGNOSIS — I10 Essential (primary) hypertension: Secondary | ICD-10-CM | POA: Diagnosis not present

## 2014-09-20 DIAGNOSIS — R1314 Dysphagia, pharyngoesophageal phase: Secondary | ICD-10-CM | POA: Diagnosis not present

## 2014-09-20 DIAGNOSIS — I2510 Atherosclerotic heart disease of native coronary artery without angina pectoris: Secondary | ICD-10-CM | POA: Diagnosis not present

## 2014-09-20 DIAGNOSIS — F0390 Unspecified dementia without behavioral disturbance: Secondary | ICD-10-CM | POA: Diagnosis not present

## 2014-09-20 NOTE — Telephone Encounter (Signed)
Darryl Lent on 09/20/14 at 9:54am  FYI call. She stated that pts orthostatic BP sitting was 130/80 left arm and standing was 88/58, HR was normal.

## 2014-09-20 NOTE — Telephone Encounter (Signed)
Noted  

## 2014-09-24 DIAGNOSIS — Z23 Encounter for immunization: Secondary | ICD-10-CM | POA: Diagnosis not present

## 2014-10-04 ENCOUNTER — Encounter: Payer: Self-pay | Admitting: Gastroenterology

## 2014-10-04 ENCOUNTER — Ambulatory Visit (INDEPENDENT_AMBULATORY_CARE_PROVIDER_SITE_OTHER): Payer: Medicare Other | Admitting: Gastroenterology

## 2014-10-04 VITALS — BP 116/68 | HR 83 | Temp 97.2°F | Ht 71.0 in | Wt 155.2 lb

## 2014-10-04 DIAGNOSIS — K219 Gastro-esophageal reflux disease without esophagitis: Secondary | ICD-10-CM | POA: Diagnosis not present

## 2014-10-04 MED ORDER — PANTOPRAZOLE SODIUM 40 MG PO TBEC: DELAYED_RELEASE_TABLET | ORAL | Status: DC

## 2014-10-04 NOTE — Progress Notes (Signed)
Referring Provider: Tammi Sou, MD Primary Care Physician:  Tammi Sou, MD  Primary GI: Dr. Gala Romney   Chief Complaint  Patient presents with  . Follow-up    HPI:   Raymond [name] is a 81 y.o. male presenting today with a history of dysphagia,  EGD on 06/28/2012 showed critical cervical esophageal web and distal esophageal ring status post dilation/disruption. Antral erosions status post biopsy, showed reactive gastropathy likely due to medication. No H. pylori seen. History of chronic mesalamine therapy secondary to UC, with last colonoscopy in 2009 at [facility]. He underwent sigmoid colectomy and repair of colovesical fistula in 2004. TCS in 2000 with acute proctitis, c/w IBD.  Colonoscopy in June 2009 by Dr. Arsenio Loader showed mild diverticulosis, sigmoid anastomosis appeared normal.  Last seen in July 2014.   Protonix once daily. No dysphagia. No abdominal pain. No constipation or diarrhea. No Apriso in 2 years. Not interested in a colonoscopy. [facility] in March and had home health for awhile but doing well now. Not interested in mesalamine therapy.   Past Medical History  Diagnosis Date  . ASCVD (arteriosclerotic cardiovascular disease)     2 MI's in 1990s  . Hypothyroidism   . Hyperlipidemia   . Anemia   . Thrombocytopenia     Platelets 99K on 09/2009 labs  . Esophageal dysphagia     Secondary to Schatzki's ring with dilation x2, most recently in 06/09, negative for H. pylori  . Psoriasis   . DJD (degenerative joint disease)   . Myocardial infarction 08-1996    Cardiac cath done but no other intervention that we know of  . Mild cognitive impairment with memory loss   . HTN (hypertension)     BP meds stopped 2016 due to recurrent orthostatic hypotension  . Ulcerative proctitis     Dx: 2000, started Asacol at that time.  Was taken off asacol 2014 and has done fine since (Dr. Sydell Axon, [facility])  . GERD (gastroesophageal reflux disease)   . Dementia   .  Chronic renal insufficiency, stage II (mild)     CrCl in the 60s  . Orthostatic hypotension     Past Surgical History  Procedure Laterality Date  . Cleft lip repair      As a child  . Neck mass excision  1979  . 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 Colonoscopy  06/2007    Dr. Olegario Messier: Few diverticula, normal anastomosiss/p sigmoid colectomy for diverticulitis  . Appendectomy  1940  . Hernia repair    . Colovesicular fistula  2004  . Esophageal dilation  06-23-07; 06/28/12    H pylori NEG.  2014-reactive gastropathy with surface erosion.  . Esophagogastroduodenoscopy (egd) with esophageal dilation N/A 06/28/2012    IRC:VELFYBOF cervical web and distal esophageal s/p dilation/Hiatal hernia. Antral erosions (reactive gastropathy, no H.pylori)  . Esophagogastroduodenoscopy  06/2007    Dr. Olegario Messier: symptomatic Schatzki ring, s/p 44mm Savary dilator, Erosive duodenitis, s/p bx     Current Outpatient Prescriptions  Medication Sig Dispense Refill  . aspirin 81 MG tablet Take 81 mg by mouth daily.      Marland Kitchen atorvastatin (LIPITOR) 20 MG tablet TAKE ONE-HALF TABLET BY MOUTH ONCE DAILY 90 tablet 1  . donepezil (ARICEPT) 10 MG tablet     . levothyroxine (SYNTHROID, LEVOTHROID) 112 MCG tablet TAKE ONE TABLET BY MOUTH ONCE DAILY BEFORE BREAKFAST 90 tablet  0  . niacin 500 MG CR capsule Take 2 capsules (1,000 mg total) by mouth daily. 90 capsule 0  . nitroGLYCERIN (NITROSTAT) 0.4 MG SL tablet Place 1 tablet (0.4 mg total) under the tongue every 5 (five) minutes as needed for chest pain. 15 tablet 1  . pantoprazole (PROTONIX) 40 MG tablet TAKE ONE TABLET BY MOUTH ONCE DAILY BEFORE BREAKFAST 90 tablet 0  . triamcinolone cream (KENALOG) 0.1 % Apply 1 application topically 2 (two) times daily. 30 g 2   No current facility-administered medications for this visit.    Allergies as of 10/04/2014  . (No Known  Allergies)    Family History  Problem Relation Age of Onset  . Heart disease Daughter     heart defect: d age 61  . Cancer Father     pt doesn't remember type    Social History   Social History  . Marital Status: Widowed    Spouse Name: N/A  . Number of Children: N/A  . Years of Education: N/A   Occupational History  . Retired from [employer]  . Smoking status: Former Smoker -- 1.00 packs/day for 10 years    Types: Cigarettes    Quit date: 01/11/1970  . Smokeless tobacco: Never Used     Comment: Minimal use at 20  . Alcohol Use: No  . Drug Use: No  . Sexual Activity: Not Currently   Other Topics Concern  . None   Social History Narrative   Widowed, has had girlfriend x 30 yrs, has 1 son.  Lives in [place], son lives in [place].   Occupation: [employer] in [place].  Retired 58s.   No regular exercise.  Smoked in the WAY distant past.   Alcohol: none.      Review of Systems: Negative unless mentioned in HPI  Physical Exam: BP 116/68 mmHg  Pulse 83  Temp(Src) 97.2 F (36.2 C) (Oral)  Ht 5\' 11"  (1.803 m)  Wt 155 lb 3.2 oz (70.398 kg)  BMI 21.66 kg/m2 General:   Alert and oriented. No distress noted. Pleasant and cooperative.  Head:  Normocephalic and atraumatic. Heart:  S1, S2 present without murmurs, rubs, or gallops. Regular rate and rhythm. Abdomen:  +BS, soft, non-tender and non-distended. No rebound or guarding. No HSM or masses noted. Msk:  Symmetrical without gross deformities. Normal posture. Extremities:  Without edema. Neurologic:  Alert and  oriented x4;  grossly normal neurologically. Psych:  Alert and cooperative. Normal mood and affect.

## 2014-10-04 NOTE — Patient Instructions (Signed)
Continue to take Protonix once daily.   We will see you back in 1 year!

## 2014-10-08 NOTE — Progress Notes (Signed)
CC'D TO PCP °

## 2014-10-08 NOTE — Assessment & Plan Note (Signed)
Doing well with Protonix once daily, no recurrence of dysphagia. History of web and ring s/p dilation in 2014. Also notable, remote hx of UC with last colonoscopy in 2009, off mesalamine therapy for several years. Patient declining colonoscopy and any further mesalamine therapy. Will see back in 1 year or sooner if needed. Protonix refilled.

## 2014-10-10 DIAGNOSIS — I252 Old myocardial infarction: Secondary | ICD-10-CM | POA: Diagnosis not present

## 2014-10-10 DIAGNOSIS — M150 Primary generalized (osteo)arthritis: Secondary | ICD-10-CM | POA: Diagnosis not present

## 2014-10-10 DIAGNOSIS — I2510 Atherosclerotic heart disease of native coronary artery without angina pectoris: Secondary | ICD-10-CM | POA: Diagnosis not present

## 2014-10-10 DIAGNOSIS — I10 Essential (primary) hypertension: Secondary | ICD-10-CM | POA: Diagnosis not present

## 2014-10-10 DIAGNOSIS — F0390 Unspecified dementia without behavioral disturbance: Secondary | ICD-10-CM | POA: Diagnosis not present

## 2014-10-10 DIAGNOSIS — R1314 Dysphagia, pharyngoesophageal phase: Secondary | ICD-10-CM | POA: Diagnosis not present

## 2014-10-18 ENCOUNTER — Other Ambulatory Visit: Payer: Self-pay | Admitting: Family Medicine

## 2014-10-18 NOTE — Telephone Encounter (Signed)
RF request for donepezil LOV: 04/27/14 Next ov: 10/25/14 Last written:unknown

## 2014-10-25 ENCOUNTER — Encounter: Payer: Self-pay | Admitting: Family Medicine

## 2014-10-25 ENCOUNTER — Ambulatory Visit: Payer: Medicare Other | Admitting: Family Medicine

## 2014-10-25 ENCOUNTER — Ambulatory Visit (INDEPENDENT_AMBULATORY_CARE_PROVIDER_SITE_OTHER): Payer: Medicare Other | Admitting: Family Medicine

## 2014-10-25 VITALS — BP 145/81 | HR 81 | Temp 97.4°F | Resp 16 | Ht 71.0 in | Wt 157.0 lb

## 2014-10-25 DIAGNOSIS — I498 Other specified cardiac arrhythmias: Secondary | ICD-10-CM

## 2014-10-25 DIAGNOSIS — E039 Hypothyroidism, unspecified: Secondary | ICD-10-CM

## 2014-10-25 LAB — TSH: TSH: 1.76 u[IU]/mL (ref 0.35–4.50)

## 2014-10-25 NOTE — Progress Notes (Signed)
OFFICE VISIT  10/25/2014   CC:  Chief Complaint  Patient presents with  . Follow-up   HPI:    Patient is a 81 y.o. Caucasian male who presents for 6 mo f/u dementia, hx of HTN but lately BP meds d/c'd due to persistent low bps. Levothyroxine dose decreased from 125 to 112 mcg back on 09/11/14, so he needs repeat TSH today. All other labs that day were stable. No acute complaints.  Compliant with all meds.  Past Medical History  Diagnosis Date  . ASCVD (arteriosclerotic cardiovascular disease)     2 MI's in 1990s  . Hypothyroidism   . Hyperlipidemia   . Anemia   . Thrombocytopenia ([place])     Platelets 99K on 09/2009 labs  . Esophageal dysphagia     Secondary to Schatzki's ring with dilation x2, most recently in 06/09, negative for H. pylori  . Psoriasis   . DJD (degenerative joint disease)   . Myocardial infarction [facility]) [id number]    Cardiac cath done but no other intervention that we know of  . Mild cognitive impairment with memory loss   . HTN (hypertension)     BP meds stopped 2016 due to recurrent orthostatic hypotension  . Ulcerative proctitis ([place])     Dx: 2000, started Asacol at that time.  Was taken off asacol 2014 and has done fine since (Dr. Sydell Axon, [facility])  . GERD (gastroesophageal reflux disease)   . Dementia   . Chronic renal insufficiency, stage II (mild)     CrCl in the 60s  . Orthostatic hypotension     Past Surgical History  Procedure Laterality Date  . Cleft lip repair      As a child  . Neck mass excision  1979  . 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 Colonoscopy  06/2007    Dr. Olegario Messier: Few diverticula, normal anastomosiss/p sigmoid colectomy for diverticulitis  . Appendectomy  1940  . Hernia repair    . Colovesicular fistula  2004  . Esophageal dilation  06-23-07; 06/28/12    H pylori NEG.  2014-reactive gastropathy with surface erosion.  .  Esophagogastroduodenoscopy (egd) with esophageal dilation N/A 06/28/2012    QMG:[id number] cervical web and distal esophageal s/p dilation/Hiatal hernia. Antral erosions (reactive gastropathy, no H.pylori)  . Esophagogastroduodenoscopy  06/2007    Dr. Olegario Messier: symptomatic Schatzki ring, s/p 14mm Savary dilator, Erosive duodenitis, s/p bx     Outpatient Prescriptions Prior to Visit  Medication Sig Dispense Refill  . aspirin 81 MG tablet Take 81 mg by mouth daily.      Marland Kitchen atorvastatin (LIPITOR) 20 MG tablet TAKE ONE-HALF TABLET BY MOUTH ONCE DAILY 90 tablet 1  . donepezil (ARICEPT) 10 MG tablet TAKE ONE TABLET BY MOUTH ONCE DAILY AT BEDTIME 90 tablet 3  . levothyroxine (SYNTHROID, LEVOTHROID) 112 MCG tablet TAKE ONE TABLET BY MOUTH ONCE DAILY BEFORE BREAKFAST 90 tablet 0  . niacin 500 MG CR capsule Take 2 capsules (1,000 mg total) by mouth daily. 90 capsule 0  . nitroGLYCERIN (NITROSTAT) 0.4 MG SL tablet Place 1 tablet (0.4 mg total) under the tongue every 5 (five) minutes as needed for chest pain. 15 tablet 1  . pantoprazole (PROTONIX) 40 MG tablet TAKE ONE TABLET BY MOUTH ONCE DAILY BEFORE BREAKFAST 90 tablet 3  . triamcinolone cream (KENALOG) 0.1 % Apply 1 application topically 2 (two) times daily. 30 g 2  . donepezil (  ARICEPT) 10 MG tablet      No facility-administered medications prior to visit.    No Known Allergies  ROS As per HPI  PE: Blood pressure 145/81, pulse 81, temperature 97.4 F (36.3 C), temperature source Oral, resp. rate 16, height 5\' 11"  (1.803 m), weight 157 lb (71.215 kg), SpO2 97 %. Gen: Alert, well appearing.  Patient is oriented to person, place, time, and situation. JME:QAST: no injection, icteris, swelling, or exudate.  EOMI, PERRLA. Mouth: lips without lesion/swelling.  Oral mucosa pink and moist. Oropharynx without erythema, exudate, or swelling.  CV: Regularly irreg rhythm, 4-1/9 systolic murmur, no rub or gallop. Chest is clear, no wheezing or rales.  Normal symmetric air entry throughout both lung fields. No chest wall deformities or tenderness. EXT: no clubbing, cyanosis, or edema.    LABS:  Lab Results  Component Value Date   TSH 0.30* 09/09/2014   Lab Results  Component Value Date   WBC 5.9 09/09/2014   HGB 12.3* 09/09/2014   HCT 37.2* 09/09/2014   MCV 95.6 09/09/2014   PLT 112.0* 09/09/2014   Lab Results  Component Value Date   CREATININE 1.01 09/09/2014   BUN 17 09/09/2014   NA 141 09/09/2014   K 4.4 09/09/2014   CL 102 09/09/2014   CO2 27 09/09/2014   Lab Results  Component Value Date   ALT 12 09/09/2014   AST 17 09/09/2014   ALKPHOS 88 09/09/2014   BILITOT 0.5 09/09/2014   Lab Results  Component Value Date   CHOL 135 04/25/2014   Lab Results  Component Value Date   HDL 35.80* 04/25/2014   Lab Results  Component Value Date   LDLCALC 75 04/25/2014   Lab Results  Component Value Date   TRIG 123.0 04/25/2014   Lab Results  Component Value Date   CHOLHDL 4 04/25/2014   12 Lead EKG today: sinus rhythm, frequent PACs, Q wave in V1 and III.  Nonspecific T wave changes. No prior EKG available for comparison.  IMPRESSION AND PLAN:  1) Dementia; fairly stable.  The current medical regimen is effective;  continue present plan and medications.  2) Regularly irreg rhythm today: EKG showed NSR, with frequent PACs.  No intervention at this time.  3) Hypothyroidism: recheck TSH today to see if recent dose adjustment brought TSH into normal range.  4) Hyperlipidemia: tolerating statin.  Lipids 6 mo ago good, AST/ALT 6 wks ago good.  An After Visit Summary was printed and given to the patient.  FOLLOW UP: Return in about 4 months (around 02/25/2015) for routine chronic illness f/u.

## 2014-10-25 NOTE — Progress Notes (Signed)
Pre visit review using our clinic review tool, if applicable. No additional management support is needed unless otherwise documented below in the visit note. 

## 2014-11-22 ENCOUNTER — Other Ambulatory Visit: Payer: Self-pay | Admitting: Family Medicine

## 2014-12-12 DIAGNOSIS — R05 Cough: Secondary | ICD-10-CM | POA: Diagnosis not present

## 2014-12-12 DIAGNOSIS — R0602 Shortness of breath: Secondary | ICD-10-CM | POA: Diagnosis not present

## 2014-12-12 DIAGNOSIS — J019 Acute sinusitis, unspecified: Secondary | ICD-10-CM | POA: Diagnosis not present

## 2014-12-12 DIAGNOSIS — J209 Acute bronchitis, unspecified: Secondary | ICD-10-CM | POA: Diagnosis not present

## 2015-01-02 ENCOUNTER — Telehealth: Payer: Self-pay | Admitting: Family Medicine

## 2015-01-02 NOTE — Telephone Encounter (Signed)
Please advise. Thanks.  

## 2015-01-02 NOTE — Telephone Encounter (Signed)
Patient's son called concerned about his dad. He feels the patient has gotten worse. He would like to know what Dr. Anitra Lauth thinks. He also inquired as to whether Dr. Mariane Masters office had sent the POA to [place] (I looked in the chart after I got off of the phone but didn't see it, maybe I'm overlooking it).

## 2015-01-03 NOTE — Telephone Encounter (Signed)
LMOM for pt's son to CB.  

## 2015-01-03 NOTE — Telephone Encounter (Signed)
Pls clarify with son: does he mean getting worse as far as his memory/thinking, etc? If so, I recommend a neurology referral to get their expert opinion to see if anything further can be done at this time.-thx

## 2015-01-07 NOTE — Telephone Encounter (Signed)
Raymond [name] to pts son. He stated that pts "friend [name]" has primary POA over pt and that he is secondary POA. He stated that he felt his father did not understand what he was doing when he did this. He stated that [name] is talking about putting pt in an assisted living home. He stated that he feels pts is not to that point and wanted to know if there was anything we could do to help him. I advised him that we are not able to change any form of POAs. He voiced understanding.

## 2015-01-07 NOTE — Telephone Encounter (Signed)
Noted  

## 2015-02-04 ENCOUNTER — Telehealth: Payer: Self-pay | Admitting: *Deleted

## 2015-02-04 NOTE — Telephone Encounter (Signed)
Office visit (30 min)-thx

## 2015-02-04 NOTE — Telephone Encounter (Signed)
Lu LMOM on 02/04/15 at 10:46am stating that she would like a call back in regards to pt.   She stated that she has been staying with pt for the past month to help him manage care. She stated that pts dementia has worsened. She stated that she will be there in his home with her and he will try calling her home to talk to her. She stated that he is also getting her confused with his mother who has been deceased. She stated that when he realizes he was confused he gets really upset. She wants to know what Dr. Anitra Lauth recommends she do. Does he need to come in for office visit or can his medications be increased. Please advise. Thanks.

## 2015-02-05 NOTE — Telephone Encounter (Signed)
Lu advised and voiced understanding. Apt 30 min  made for 02/18/15 at 11:00am

## 2015-02-05 NOTE — Telephone Encounter (Signed)
Tried calling NA and unable to leave a message.  

## 2015-02-10 ENCOUNTER — Telehealth: Payer: Self-pay | Admitting: Family Medicine

## 2015-02-10 NOTE — Telephone Encounter (Signed)
Patient's son walked into office today at [place] [facility] and is requesting a CB regarding his dad.  Pt's son states that pt's girlfriend has taken away their rights to see the patient and the girlfriend is trying to put patient in a nursing home.  Pts son states that girlfriend has advised patient to change his will and everything.  Please advise.   [phone] is best contact number for pt's son.

## 2015-02-11 NOTE — Telephone Encounter (Signed)
Talked to pt's son, Raymond [name]--ok per DPR. He voiced concern b/c pt's POA, [facility], is showing signs of trying to put him into ALF or NH. Family feels that pt is not that bad and they feel strongly that he needs to stay in his home and they want to try to get him more assistance in home so he can be safe/well cared for.  Pt's son says Raymond [name] has stated she is tired of taking care of pt, but when family tries to do things to check in on pt or to help, Raymond [name] impedes things. Son asked if a letter from a lawyer could be placed in his chart.  I said that is fine as long as the patient authorizes it. I told the son the only recourse he may have at this point is to try to get POA taken from [place], and he said he is going to start working on that (via a Chief Executive Officer).

## 2015-02-11 NOTE — Telephone Encounter (Signed)
Pls call pt's son this morning and tell him I'll call him around noon today after I finish seeing my morning clinic patients. Pls confirm that the son is North Jr--this is the name of the son that DPR says we can talk to.  Thank you.

## 2015-02-11 NOTE — Telephone Encounter (Signed)
Left detailed message on pt's son cell.  I did confirm this was Raymond [name] yesterday when getting contact information.

## 2015-02-14 DIAGNOSIS — R5383 Other fatigue: Secondary | ICD-10-CM | POA: Diagnosis not present

## 2015-02-14 DIAGNOSIS — E559 Vitamin D deficiency, unspecified: Secondary | ICD-10-CM | POA: Diagnosis not present

## 2015-02-14 DIAGNOSIS — F0390 Unspecified dementia without behavioral disturbance: Secondary | ICD-10-CM | POA: Diagnosis not present

## 2015-02-14 DIAGNOSIS — K219 Gastro-esophageal reflux disease without esophagitis: Secondary | ICD-10-CM | POA: Diagnosis not present

## 2015-02-14 DIAGNOSIS — B353 Tinea pedis: Secondary | ICD-10-CM | POA: Diagnosis not present

## 2015-02-14 DIAGNOSIS — E039 Hypothyroidism, unspecified: Secondary | ICD-10-CM | POA: Diagnosis not present

## 2015-02-14 DIAGNOSIS — E785 Hyperlipidemia, unspecified: Secondary | ICD-10-CM | POA: Diagnosis not present

## 2015-02-14 DIAGNOSIS — G3184 Mild cognitive impairment, so stated: Secondary | ICD-10-CM | POA: Diagnosis not present

## 2015-02-14 DIAGNOSIS — K210 Gastro-esophageal reflux disease with esophagitis: Secondary | ICD-10-CM | POA: Diagnosis not present

## 2015-02-18 ENCOUNTER — Ambulatory Visit: Payer: Medicare Other | Admitting: Family Medicine

## 2015-03-04 DIAGNOSIS — L03031 Cellulitis of right toe: Secondary | ICD-10-CM | POA: Diagnosis not present

## 2015-03-10 DIAGNOSIS — D509 Iron deficiency anemia, unspecified: Secondary | ICD-10-CM | POA: Diagnosis not present

## 2015-03-10 DIAGNOSIS — F0390 Unspecified dementia without behavioral disturbance: Secondary | ICD-10-CM | POA: Diagnosis not present

## 2015-03-10 DIAGNOSIS — K219 Gastro-esophageal reflux disease without esophagitis: Secondary | ICD-10-CM | POA: Diagnosis not present

## 2015-03-10 DIAGNOSIS — E782 Mixed hyperlipidemia: Secondary | ICD-10-CM | POA: Diagnosis not present

## 2015-03-10 DIAGNOSIS — E039 Hypothyroidism, unspecified: Secondary | ICD-10-CM | POA: Diagnosis not present

## 2015-03-10 DIAGNOSIS — B353 Tinea pedis: Secondary | ICD-10-CM | POA: Diagnosis not present

## 2015-03-10 DIAGNOSIS — R944 Abnormal results of kidney function studies: Secondary | ICD-10-CM | POA: Diagnosis not present

## 2015-03-18 DIAGNOSIS — L03031 Cellulitis of right toe: Secondary | ICD-10-CM | POA: Diagnosis not present

## 2015-04-22 ENCOUNTER — Ambulatory Visit: Payer: Medicare Other | Admitting: Family Medicine

## 2015-06-12 ENCOUNTER — Other Ambulatory Visit: Payer: Self-pay | Admitting: Family Medicine

## 2015-06-25 DIAGNOSIS — E039 Hypothyroidism, unspecified: Secondary | ICD-10-CM | POA: Diagnosis not present

## 2015-06-25 DIAGNOSIS — E782 Mixed hyperlipidemia: Secondary | ICD-10-CM | POA: Diagnosis not present

## 2015-06-27 DIAGNOSIS — F0390 Unspecified dementia without behavioral disturbance: Secondary | ICD-10-CM | POA: Diagnosis not present

## 2015-06-27 DIAGNOSIS — E039 Hypothyroidism, unspecified: Secondary | ICD-10-CM | POA: Diagnosis not present

## 2015-06-27 DIAGNOSIS — E782 Mixed hyperlipidemia: Secondary | ICD-10-CM | POA: Diagnosis not present

## 2015-06-27 DIAGNOSIS — N182 Chronic kidney disease, stage 2 (mild): Secondary | ICD-10-CM | POA: Diagnosis not present

## 2015-07-17 DIAGNOSIS — T63441A Toxic effect of venom of bees, accidental (unintentional), initial encounter: Secondary | ICD-10-CM | POA: Diagnosis not present

## 2015-08-14 ENCOUNTER — Encounter: Payer: Self-pay | Admitting: Internal Medicine

## 2015-09-21 ENCOUNTER — Emergency Department (HOSPITAL_COMMUNITY): Payer: Medicare Other

## 2015-09-21 ENCOUNTER — Emergency Department (HOSPITAL_COMMUNITY)
Admission: EM | Admit: 2015-09-21 | Discharge: 2015-09-21 | Disposition: A | Discharge status: Home / Self Care | Payer: Medicare Other | Attending: Emergency Medicine | Admitting: Emergency Medicine

## 2015-09-21 ENCOUNTER — Encounter (HOSPITAL_COMMUNITY): Payer: Self-pay | Admitting: Emergency Medicine

## 2015-09-21 DIAGNOSIS — I10 Essential (primary) hypertension: Secondary | ICD-10-CM | POA: Insufficient documentation

## 2015-09-21 DIAGNOSIS — Y999 Unspecified external cause status: Secondary | ICD-10-CM | POA: Insufficient documentation

## 2015-09-21 DIAGNOSIS — Z7982 Long term (current) use of aspirin: Secondary | ICD-10-CM | POA: Insufficient documentation

## 2015-09-21 DIAGNOSIS — W268XXA Contact with other sharp object(s), not elsewhere classified, initial encounter: Secondary | ICD-10-CM | POA: Insufficient documentation

## 2015-09-21 DIAGNOSIS — Y939 Activity, unspecified: Secondary | ICD-10-CM | POA: Insufficient documentation

## 2015-09-21 DIAGNOSIS — S199XXA Unspecified injury of neck, initial encounter: Secondary | ICD-10-CM | POA: Diagnosis not present

## 2015-09-21 DIAGNOSIS — W19XXXA Unspecified fall, initial encounter: Secondary | ICD-10-CM

## 2015-09-21 DIAGNOSIS — S0101XA Laceration without foreign body of scalp, initial encounter: Secondary | ICD-10-CM | POA: Diagnosis not present

## 2015-09-21 DIAGNOSIS — S3992XA Unspecified injury of lower back, initial encounter: Secondary | ICD-10-CM | POA: Diagnosis not present

## 2015-09-21 DIAGNOSIS — Z79899 Other long term (current) drug therapy: Secondary | ICD-10-CM | POA: Insufficient documentation

## 2015-09-21 DIAGNOSIS — Z87891 Personal history of nicotine dependence: Secondary | ICD-10-CM | POA: Diagnosis not present

## 2015-09-21 DIAGNOSIS — S60812A Abrasion of left wrist, initial encounter: Secondary | ICD-10-CM | POA: Diagnosis not present

## 2015-09-21 DIAGNOSIS — E039 Hypothyroidism, unspecified: Secondary | ICD-10-CM | POA: Diagnosis not present

## 2015-09-21 DIAGNOSIS — Y92511 Restaurant or cafe as the place of occurrence of the external cause: Secondary | ICD-10-CM | POA: Diagnosis not present

## 2015-09-21 DIAGNOSIS — M25532 Pain in left wrist: Secondary | ICD-10-CM | POA: Diagnosis not present

## 2015-09-21 DIAGNOSIS — S098XXA Other specified injuries of head, initial encounter: Secondary | ICD-10-CM | POA: Diagnosis not present

## 2015-09-21 DIAGNOSIS — S0990XA Unspecified injury of head, initial encounter: Secondary | ICD-10-CM | POA: Diagnosis present

## 2015-09-21 DIAGNOSIS — S6992XA Unspecified injury of left wrist, hand and finger(s), initial encounter: Secondary | ICD-10-CM | POA: Diagnosis not present

## 2015-09-21 DIAGNOSIS — M545 Low back pain: Secondary | ICD-10-CM | POA: Diagnosis not present

## 2015-09-21 MED ORDER — LIDOCAINE HCL (PF) 1 % IJ SOLN
5.0000 mL | Freq: Once | INTRAMUSCULAR | Status: DC
  Filled 2015-09-21: qty 5

## 2015-09-21 MED ORDER — BACITRACIN-NEOMYCIN-POLYMYXIN 400-5-5000 EX OINT
TOPICAL_OINTMENT | Freq: Once | CUTANEOUS | Status: AC
  Administered 2015-09-21: via TOPICAL
  Filled 2015-09-21: qty 1

## 2015-09-21 NOTE — ED Notes (Signed)
Pateint also has small wound to left wrist and reports that he does actual have pain.

## 2015-09-21 NOTE — Discharge Instructions (Signed)
Have your staples removed in 10 days, either by Dr. Nevada Crane or any urgent care center.  You may also return here if needed as discussed.  Your CT scans and xrays are ok tonight. However,  the xray of your low back suggests you may have an enlarged descending aortic blood vessel (aneurysm) which you will want to discuss with your primary doctor.  Call for an appointment for further evaluation of this possible condition. There is no sign of internal injury from tonights fall.

## 2015-09-21 NOTE — ED Triage Notes (Addendum)
Patient fell today outside of restaurant hitting back on concrete and head on metal bench. Patient has laceration and puncture wound to back of head. Not actively bleeding at this time. Denies any LOC or nausea and vomiting. Patient has unsteady gait in which friend states that is his normal gait. Patient denies any pain. Tetanus up to date per friend. Patient denies taking any blood thinners.

## 2015-09-21 NOTE — ED Provider Notes (Signed)
[facility] DEPT Provider Note   CSN: [id number] Arrival date & time: 09/21/15  [id number]     History   Chief Complaint Chief Complaint  Patient presents with  . Fall    HPI Raymond [name] is a 82 y.o. male presenting with head injury and scalp laceration incurred in a fall prior to arrival.  He has advanced dementia and cannot recall the event but his wife and stepdaughter relate that he tripped coming out of a restaurant prior to arrival hitting the back of his head on a park bench, probably due to unsteady gait at baseline.  He did not have any LOC at the time of the event and has been his normal, pleasantly demented self per family and does not appear to be in distress.  He has sustained an abrasion to his left wrist during the fall as well.  He is not on blood thinners.  He is utd with his vaccines.  The history is provided by the patient, the spouse and a relative.    Past Medical History:  Diagnosis Date  . Anemia   . ASCVD (arteriosclerotic cardiovascular disease)    2 MI's in 1990s  . Chronic renal insufficiency, stage II (mild)    CrCl in the 60s  . Dementia   . DJD (degenerative joint disease)   . Esophageal dysphagia    Secondary to Schatzki's ring with dilation x2, most recently in 06/09, negative for H. pylori  . GERD (gastroesophageal reflux disease)   . HTN (hypertension)    BP meds stopped 2016 due to recurrent orthostatic hypotension  . Hyperlipidemia   . Hypothyroidism   . Mild cognitive impairment with memory loss   . Myocardial infarction [facility]) [id number]   Cardiac cath done but no other intervention that we know of  . Orthostatic hypotension   . Psoriasis   . Thrombocytopenia ([place])    Platelets 99K on 09/2009 labs  . Ulcerative proctitis ([place])    Dx: 2000, started Asacol at that time.  Was taken off asacol 2014 and has done fine since (Dr. Sydell Axon, [facility])    Patient Active Problem List   Diagnosis Date Noted  . GERD (gastroesophageal  reflux disease) 10/04/2014  . Fatigue 09/09/2014  . Low blood pressure 05/03/2013  . Dementia 04/16/2013  . Hyperlipidemia 04/16/2013  . Esophageal dysphagia 06/23/2012  . Other and unspecified noninfectious gastroenteritis and colitis(558.9) 06/23/2012  . Unspecified essential hypertension 04/08/2012  . Heart attack ([place])   . Hypothyroidism 10/15/2009  . Psoriasis 10/15/2009  . HYPERLIPIDEMIA 09/11/2009  . ATHEROSCLEROTIC CARDIOVASCULAR DISEASE 09/11/2009  . SCHATZKI'S RING 09/11/2009  . DIVERTICULAR DISEASE 09/11/2009    Past Surgical History:  Procedure Laterality Date  . APPENDECTOMY  1940  . CLEFT LIP REPAIR     As a child  . COLECTOMY  2004   Colonovesicular fistula secondary to diverticulitis requiring sigmoid colectomy and bladder repair  . COLONOSCOPY  06/2007   Dr. Olegario Messier: Few diverticula, normal anastomosiss/p sigmoid colectomy for diverticulitis  . colovesicular fistula  2004  . ESOPHAGEAL DILATION  06-23-07; 06/28/12   H pylori NEG.  2014-reactive gastropathy with surface erosion.  . ESOPHAGOGASTRODUODENOSCOPY  06/2007   Dr. Olegario Messier: symptomatic Schatzki ring, s/p 76mm Savary dilator, Erosive duodenitis, s/p bx   . ESOPHAGOGASTRODUODENOSCOPY (EGD) WITH ESOPHAGEAL DILATION N/A 06/28/2012   [id number] cervical web and distal esophageal s/p dilation/Hiatal hernia. Antral erosions (reactive gastropathy, no H.pylori)  . HERNIA REPAIR    . NECK MASS EXCISION  1979       Home Medications    Prior to Admission medications   Medication Sig Start Date End Date Taking? Authorizing Provider  aspirin 81 MG tablet Take 81 mg by mouth daily.      Historical Provider, MD  atorvastatin (LIPITOR) 20 MG tablet TAKE ONE-HALF TABLET BY MOUTH ONCE DAILY 07/18/14   Tammi Sou, MD  donepezil (ARICEPT) 10 MG tablet TAKE ONE TABLET BY MOUTH ONCE DAILY AT BEDTIME 10/18/14   Tammi Sou, MD  levothyroxine (SYNTHROID, LEVOTHROID) 112 MCG tablet TAKE ONE TABLET BY MOUTH  ONCE DAILY BEFORE BREAKFAST 11/22/14   Renee A Kuneff, DO  niacin 500 MG CR capsule Take 2 capsules (1,000 mg total) by mouth daily. 08/23/13   Tammi Sou, MD  nitroGLYCERIN (NITROSTAT) 0.4 MG SL tablet Place 1 tablet (0.4 mg total) under the tongue every 5 (five) minutes as needed for chest pain. 06/26/14   Tammi Sou, MD  pantoprazole (PROTONIX) 40 MG tablet TAKE ONE TABLET BY MOUTH ONCE DAILY BEFORE BREAKFAST 10/04/14   Annitta Needs, NP  triamcinolone cream (KENALOG) 0.1 % Apply 1 application topically 2 (two) times daily. 09/09/14   Renee A Raoul Pitch, DO    Family History Family History  Problem Relation Age of Onset  . Cancer Father     pt doesn't remember type  . Heart disease Daughter     heart defect: d age 39    Social History Social History  Substance Use Topics  . Smoking status: Former Smoker    Packs/day: 1.00    Years: 10.00    Types: Cigarettes    Quit date: 01/11/1970  . Smokeless tobacco: Never Used     Comment: Minimal use at 20  . Alcohol use No     Allergies   Review of patient's allergies indicates no known allergies.   Review of Systems Review of Systems  Unable to perform ROS: Dementia  Constitutional: Negative for activity change.  Gastrointestinal: Negative for vomiting.     Physical Exam Updated Vital Signs BP 138/89 (BP Location: Left Arm)   Pulse 75   Temp 97.3 F (36.3 C) (Oral)   Resp 16   Ht 5\' 11"  (1.803 m)   Wt 66.5 kg   SpO2 100%   BMI 20.43 kg/m   Physical Exam  Constitutional: He appears well-developed and well-nourished.  HENT:  Head: Normocephalic. Head is with laceration. Head is without raccoon's eyes and without Battle's sign.  Subcutaneous laceration posterior scalp, linear, hemostatic.  Eyes: Conjunctivae and EOM are normal. Pupils are equal, round, and reactive to light.  Neck: Normal range of motion.  Cardiovascular: Normal rate, normal heart sounds and intact distal pulses.   Pulmonary/Chest: Effort normal  and breath sounds normal. He has no wheezes.  Abdominal: Soft. There is no tenderness.  Musculoskeletal: Normal range of motion.  Neurological: He is alert.  Pleasantly demented, cooperative.  Baseline per family.  Skin: Skin is warm and dry.  Psychiatric: He has a normal mood and affect.  Nursing note and vitals reviewed.    ED Treatments / Results  Labs (all labs ordered are listed, but only abnormal results are displayed) Labs Reviewed - No data to display  EKG  EKG Interpretation None       Radiology Dg Lumbar Spine Complete  Result Date: 09/21/2015 CLINICAL DATA:  Fall backwards with low back pain. Initial encounter. EXAM: LUMBAR SPINE - COMPLETE 4+ VIEW COMPARISON:  None. FINDINGS: Limited shallow  oblique radiographs. No acute fracture or traumatic malalignment. Osteopenia and diffuse degenerative disc narrowing and endplate spurring. Cholelithiasis and small calcifications over the epigastrium favoring calcific pancreatitis. Chronic reticulation the lower lungs. Broad appearance of the lower thoracic aorta. The abdominal aorta is atherosclerotic and tortuous. Osteopenia. IMPRESSION: 1. No acute finding. 2. Diffuse degenerative change. 3. Cholelithiasis and suspected chronic pancreatitis. 4. Tortuous and possibly aneurysmal descending thoracic aorta. Electronically Signed   By: Monte Fantasia M.D.   On: 09/21/2015 20:20   Dg Wrist Complete Left  Result Date: 09/21/2015 CLINICAL DATA:  Status post fall.  Left wrist pain. EXAM: LEFT WRIST - COMPLETE 3+ VIEW COMPARISON:  None. FINDINGS: Generalized osteopenia. No acute fracture or dislocation. Mild osteoarthritis of the radiocarpal joint. Mild osteoarthritis of the scaphotrapeziotrapezoid joint. Mild osteoarthritis of the first carpometacarpal joint. Mild osteoarthritis of the first MCP joint. No soft tissue abnormality. Peripheral vascular atherosclerotic disease. IMPRESSION: No acute osseous injury of the left wrist.  Electronically Signed   By: Kathreen Devoid   On: 09/21/2015 20:25   Ct Head Wo Contrast  Result Date: 09/21/2015 CLINICAL DATA:  Fall today, hit back of head. Pt unable to tell me what happened-states he didn't know he fell. Patient fell today outside of restaurant hitting back on concrete and head on metal bench. Patient has laceration and puncture wound to back of head. EXAM: CT HEAD WITHOUT CONTRAST CT CERVICAL SPINE WITHOUT CONTRAST TECHNIQUE: Multidetector CT imaging of the head and cervical spine was performed following the standard protocol without intravenous contrast. Multiplanar CT image reconstructions of the cervical spine were also generated. COMPARISON:  None. FINDINGS: CT HEAD FINDINGS Brain: No evidence of acute infarction, hemorrhage, extra-axial collection, ventriculomegaly, or mass effect. Generalized cerebral atrophy. Periventricular white matter low attenuation likely secondary to microangiopathy. Vascular: Cerebrovascular atherosclerotic calcifications are noted. Skull: Negative for fracture or focal lesion. Sinuses/Orbits: Visualized portions of the orbits are unremarkable. Visualized portions of the paranasal sinuses and mastoid air cells are unremarkable. Other: None. CT CERVICAL SPINE FINDINGS Alignment: 3 mm of anterolisthesis of C4 on C5. Skull base and vertebrae: No acute fracture. No primary bone lesion or focal pathologic process. Soft tissues and spinal canal: No prevertebral fluid or swelling. No visible canal hematoma. Disc levels: Degenerative disc disease with disc height loss at C5-6 and C6-7. Osseous fusion of the posterior elements of C2-3. Severe left and moderate right facet arthropathy at C3-4 with bilateral foraminal narrowing. Moderate bilateral facet arthropathy at C4-5. Moderate bilateral facet arthropathy at C5-6 with bilateral foraminal stenosis. Bilateral uncovertebral degenerative changes at C6-7 with bilateral facet arthropathy resulting in foraminal stenosis.  Upper chest: Mild right apical scarring. Other: Bilateral carotid artery atherosclerosis. No fluid collection or hematoma. IMPRESSION: 1. No acute intracranial pathology. 2. No acute osseous injury of the cervical spine. 3. Cervical spine spondylosis as described above. Electronically Signed   By: Kathreen Devoid   On: 09/21/2015 20:41   Ct Cervical Spine Wo Contrast  Result Date: 09/21/2015 CLINICAL DATA:  Fall today, hit back of head. Pt unable to tell me what happened-states he didn't know he fell. Patient fell today outside of restaurant hitting back on concrete and head on metal bench. Patient has laceration and puncture wound to back of head. EXAM: CT HEAD WITHOUT CONTRAST CT CERVICAL SPINE WITHOUT CONTRAST TECHNIQUE: Multidetector CT imaging of the head and cervical spine was performed following the standard protocol without intravenous contrast. Multiplanar CT image reconstructions of the cervical spine were also generated. COMPARISON:  None.  FINDINGS: CT HEAD FINDINGS Brain: No evidence of acute infarction, hemorrhage, extra-axial collection, ventriculomegaly, or mass effect. Generalized cerebral atrophy. Periventricular white matter low attenuation likely secondary to microangiopathy. Vascular: Cerebrovascular atherosclerotic calcifications are noted. Skull: Negative for fracture or focal lesion. Sinuses/Orbits: Visualized portions of the orbits are unremarkable. Visualized portions of the paranasal sinuses and mastoid air cells are unremarkable. Other: None. CT CERVICAL SPINE FINDINGS Alignment: 3 mm of anterolisthesis of C4 on C5. Skull base and vertebrae: No acute fracture. No primary bone lesion or focal pathologic process. Soft tissues and spinal canal: No prevertebral fluid or swelling. No visible canal hematoma. Disc levels: Degenerative disc disease with disc height loss at C5-6 and C6-7. Osseous fusion of the posterior elements of C2-3. Severe left and moderate right facet arthropathy at C3-4  with bilateral foraminal narrowing. Moderate bilateral facet arthropathy at C4-5. Moderate bilateral facet arthropathy at C5-6 with bilateral foraminal stenosis. Bilateral uncovertebral degenerative changes at C6-7 with bilateral facet arthropathy resulting in foraminal stenosis. Upper chest: Mild right apical scarring. Other: Bilateral carotid artery atherosclerosis. No fluid collection or hematoma. IMPRESSION: 1. No acute intracranial pathology. 2. No acute osseous injury of the cervical spine. 3. Cervical spine spondylosis as described above. Electronically Signed   By: Kathreen Devoid   On: 09/21/2015 20:41    Procedures Procedures (including critical care time)  LACERATION REPAIR Performed by: Evalee [place] Authorized by: Evalee [place] Consent: Verbal consent obtained. Risks and benefits: risks, benefits and alternatives were discussed Consent given by: patient Patient identity confirmed: provided demographic data Prepped and Draped in normal sterile fashion Wound explored  Laceration Location: occipital scalp  Laceration Length:  3 cm  No Foreign Bodies seen or palpated  Anesthesia: local infiltration  Local anesthetic: lidocaine 1% without epinephrine  Anesthetic total: 3 ml  Irrigation method: syringe Amount of cleaning: standard  Skin closure: staples  Number of staples: 7  Technique: staples  Patient tolerance: Patient tolerated the procedure well with no immediate complications.   Medications Ordered in ED Medications  neomycin-bacitracin-polymyxin (NEOSPORIN) ointment ( Topical Given 09/21/15 2345)     Initial Impression / Assessment and Plan / ED Course  I have reviewed the triage vital signs and the nursing notes.  Pertinent labs & imaging results that were available during my care of the patient were reviewed by me and considered in my medical decision making (see chart for details).  Clinical Course    Abrasion left wrist dressed after abx ointment  applied.  Wound care instructions given.  Pt advised to have staples removed in 10 days,  Return here sooner for any signs of infection including redness, swelling, worse pain or drainage of pus.  Discussed with wife xray finding suggesting possible enlarged/aneurysm of thoracic descending aorta.  Advised f/u with pcp for further eval/management of this possible condition.    Discussed with Dr. Rogene Houston prior to dc home.     Final Clinical Impressions(s) / ED Diagnoses   Final diagnoses:  Fall, initial encounter  Head injury, initial encounter  Scalp laceration, initial encounter    New Prescriptions Discharge Medication List as of 09/21/2015 11:08 PM       Evalee [place], PA-C 09/22/15 2104    Fredia Sorrow, MD 10/09/15 [phone]

## 2015-09-26 DIAGNOSIS — Z23 Encounter for immunization: Secondary | ICD-10-CM | POA: Diagnosis not present

## 2015-10-01 ENCOUNTER — Other Ambulatory Visit: Payer: Self-pay | Admitting: *Deleted

## 2015-10-01 DIAGNOSIS — S0180XD Unspecified open wound of other part of head, subsequent encounter: Secondary | ICD-10-CM | POA: Diagnosis not present

## 2015-10-01 DIAGNOSIS — I700 Atherosclerosis of aorta: Secondary | ICD-10-CM

## 2015-11-03 ENCOUNTER — Encounter: Payer: Self-pay | Admitting: Vascular Surgery

## 2015-11-05 ENCOUNTER — Encounter: Payer: Self-pay | Admitting: Vascular Surgery

## 2015-11-05 ENCOUNTER — Ambulatory Visit (INDEPENDENT_AMBULATORY_CARE_PROVIDER_SITE_OTHER): Payer: Medicare Other | Admitting: Vascular Surgery

## 2015-11-05 ENCOUNTER — Ambulatory Visit (HOSPITAL_COMMUNITY)
Admission: RE | Admit: 2015-11-05 | Discharge: 2015-11-05 | Disposition: A | Discharge status: Home / Self Care | Payer: Medicare Other | Source: Ambulatory Visit | Attending: Vascular Surgery | Admitting: Vascular Surgery

## 2015-11-05 VITALS — BP 145/89 | HR 80 | Temp 96.8°F | Resp 16 | Ht 70.0 in | Wt 143.0 lb

## 2015-11-05 DIAGNOSIS — I700 Atherosclerosis of aorta: Secondary | ICD-10-CM | POA: Diagnosis not present

## 2015-11-05 DIAGNOSIS — I739 Peripheral vascular disease, unspecified: Secondary | ICD-10-CM

## 2015-11-05 NOTE — Progress Notes (Signed)
Referred by:  Celene Squibb, MD [address] [place], [place] [address]  Reason for referral: calcific atherosclerosis   History of Present Illness  Raymond [name] is a 82 y.o. (16-Oct-1926) male who presents with chief complaint: "not certain why I'm here."  Pt had a fall 09/21/15 and was evaluated at ED.  On his L-spine Xray:  Calcific atherosclerotic change of his aorta with possible aneurysmal changes were noted.  This patient denies any intermittent claudication.  His wife notes his ambulation is somewhat limited due to functional deterioration with mild dementia.  He denies any rest pain or wounds.  Atherosclerotic risk factors include: HLD, HTN, and prior smoker.   Past Medical History:  Diagnosis Date  . Anemia   . ASCVD (arteriosclerotic cardiovascular disease)    2 MI's in 1990s  . Chronic renal insufficiency, stage II (mild)    CrCl in the 60s  . Dementia   . DJD (degenerative joint disease)   . Esophageal dysphagia    Secondary to Schatzki's ring with dilation x2, most recently in 06/09, negative for H. pylori  . GERD (gastroesophageal reflux disease)   . HTN (hypertension)    BP meds stopped 2016 due to recurrent orthostatic hypotension  . Hyperlipidemia   . Hypothyroidism   . Mild cognitive impairment with memory loss   . Myocardial infarction 08-1996   Cardiac cath done but no other intervention that we know of  . Orthostatic hypotension   . Psoriasis   . Thrombocytopenia ([place])    Platelets 99K on 09/2009 labs  . Ulcerative proctitis ([place])    Dx: 2000, started Asacol at that time.  Was taken off asacol 2014 and has done fine since (Dr. Sydell Axon, [facility])    Past Surgical History:  Procedure Laterality Date  . APPENDECTOMY  1940  . CLEFT LIP REPAIR     As a child  . COLECTOMY  2004   Colonovesicular fistula secondary to diverticulitis requiring sigmoid colectomy and bladder repair  . COLONOSCOPY  06/2007   Dr. Olegario Messier: Few diverticula,  normal anastomosiss/p sigmoid colectomy for diverticulitis  . colovesicular fistula  2004  . ESOPHAGEAL DILATION  06-23-07; 06/28/12   H pylori NEG.  2014-reactive gastropathy with surface erosion.  . ESOPHAGOGASTRODUODENOSCOPY  06/2007   Dr. Olegario Messier: symptomatic Schatzki ring, s/p 36mm Savary dilator, Erosive duodenitis, s/p bx   . ESOPHAGOGASTRODUODENOSCOPY (EGD) WITH ESOPHAGEAL DILATION N/A 06/28/2012   [id number] cervical web and distal esophageal s/p dilation/Hiatal hernia. Antral erosions (reactive gastropathy, no H.pylori)  . HERNIA REPAIR    . NECK MASS EXCISION  1979    Social History   Social History  . Marital status: Widowed    Spouse name: N/A  . Number of children: N/A  . Years of education: N/A   Occupational History  . Retired from [employer]  . Smoking status: Former Smoker    Packs/day: 1.00    Years: 10.00    Types: Cigarettes    Quit date: 01/11/1970  . Smokeless tobacco: Never Used     Comment: Minimal use at 20  . Alcohol use No  . Drug use: No  . Sexual activity: Not Currently   Other Topics Concern  . Not on file   Social History Narrative   Widowed, has had girlfriend x 30 yrs, has 1 son.  Lives in [place], son lives in [place].   Occupation: [employer] in [place].  Retired  1990s.   No regular exercise.  Smoked in the WAY distant past.   Alcohol: none.      Family History  Problem Relation Age of Onset  . Cancer Father     pt doesn't remember type  . Heart disease Daughter     heart defect: d age 51    Current Outpatient Prescriptions  Medication Sig Dispense Refill  . aspirin 81 MG tablet Take 81 mg by mouth daily.      Marland Kitchen atorvastatin (LIPITOR) 10 MG tablet     . atorvastatin (LIPITOR) 20 MG tablet TAKE ONE-HALF TABLET BY MOUTH ONCE DAILY 90 tablet 1  . donepezil (ARICEPT) 10 MG tablet TAKE ONE TABLET BY MOUTH ONCE DAILY AT BEDTIME 90 tablet 3  . levothyroxine (SYNTHROID, LEVOTHROID) 112 MCG tablet  TAKE ONE TABLET BY MOUTH ONCE DAILY BEFORE BREAKFAST 90 tablet 1  . niacin 500 MG CR capsule Take 2 capsules (1,000 mg total) by mouth daily. 90 capsule 0  . nitroGLYCERIN (NITROSTAT) 0.4 MG SL tablet Place 1 tablet (0.4 mg total) under the tongue every 5 (five) minutes as needed for chest pain. 15 tablet 1  . pantoprazole (PROTONIX) 40 MG tablet TAKE ONE TABLET BY MOUTH ONCE DAILY BEFORE BREAKFAST 90 tablet 3  . triamcinolone cream (KENALOG) 0.1 % Apply 1 application topically 2 (two) times daily. 30 g 2   No current facility-administered medications for this visit.     No Known Allergies   REVIEW OF SYSTEMS:   Cardiac:  positive for: no symptoms, negative for: Chest pain or chest pressure, Shortness of breath upon exertion and Shortness of breath when lying flat,   Vascular:  positive for: no symptoms,  negative for: Pain in calf, thigh, or hip brought on by ambulation, Pain in feet at night that wakes you up from your sleep, Blood clot in your veins and Leg swelling  Pulmonary:  positive for: no symptoms,  negative for: Oxygen at home, Productive cough and Wheezing  Neurologic:  positive for: No symptoms, negative for: Sudden weakness in arms or legs, Sudden numbness in arms or legs, Sudden onset of difficulty speaking or slurred speech, Temporary loss of vision in one eye and Problems with dizziness  Gastrointestinal:  positive for: no symptoms, negative for: Blood in stool and Vomited blood  Genitourinary:  positive for: no symptoms, negative for: Burning when urinating and Blood in urine  Psychiatric:  positive for: no symptoms,  negative for: Major depression  Hematologic:  positive for: Bleeding problems,  negative for: negative for: Problems with blood clotting too easily  Dermatologic:  positive for: no symptoms, negative for: Rashes or ulcers  Constitutional:  positive for: no symptoms, negative for: Fever or chills  Ear/Nose/Throat:  positive for: no  symptoms, negative for: Change in hearing, Nose bleeds and Sore throat  Musculoskeletal:  positive for: no symptoms, negative for: Back pain, Joint pain and Muscle pain   Physical Examination  Vitals:   11/05/15 1013 11/05/15 1015  BP: (!) 162/87 (!) 145/89  Pulse: 75 80  Resp: 16   Temp: (!) 96.8 F (36 C)   SpO2: 100%   Weight: 143 lb (64.9 kg)   Height: 5\' 10"  (1.778 m)     Body mass index is 20.52 kg/m.  General: Alert, O x 3, WD,NAD  Head: [place]/AT,   Ear/Nose/Throat: Hearing grossly intact, nares without erythema or drainage, oropharynx without Erythema or Exudate , Mallampati score: 3, Dentition intact  Eyes: PERRLA, EOMI,   Neck:  Supple, mid-line trachea,    Pulmonary: Sym exp, good B air movt,CTA B  Cardiac: RRR, Nl S1, S2, no Murmurs, No rubs, No S3,S4  Vascular: Vessel Right Left  Radial Palpable Palpable  Brachial Palpable Palpable  Carotid Palpable, No Bruit Palpable, No Bruit  Aorta Not palpable N/A  Femoral Palpable Palpable  Popliteal Not palpable Not palpable  PT Palpable Palpable  DP Faintly palpable Faintly palpable   Gastrointestinal: soft, non-distended, non-tender to palpation, No guarding or rebound, no HSM, no masses, no CVAT B, No palpable prominent aortic pulse,    Musculoskeletal: M/S 5/5 throughout  , Extremities without ischemic changes  , No edema present, Varicosities present: B, No LDS present  Neurologic: CN 2-12 intact , Pain and light touch intact in extremities , Motor exam as listed above  Psychiatric: Judgement intact, Mood & affect appropriate for pt's clinical situation  Dermatologic: See M/S exam for extremity exam, No rashes otherwise noted  Lymph : Palpable lymph nodes: None   Non-Invasive Vascular Imaging  Aortic duplex (11/05/2015)  No aortic or iliac aneurysms   Outside Studies/Documentation 10 pages of outside documents were reviewed including: outside ED report and PCP report.  Medical Decision  Making  MACEDONIO MINCEY is a 82 y.o. male who presents with: aortic calcification suggestive of some degree of PAD   This patient is asx, with palpable pedal pulses, so I don't think there is any need to pursue further non-invasive imaging as no intervention is needed anyway.  Based on this patient's history and physical exam, I recommend: maximal medical mgmt.  I discussed in depth with the patient the nature of atherosclerosis, and emphasized the importance of maximal medical management including strict control of blood pressure, blood glucose, and lipid levels, antiplatelet agent, obtaining regular exercise, and cessation of smoking.    The patient is aware that without maximal medical management the underlying atherosclerotic disease process will progress, limiting the benefit of any interventions. I encourage the patient to ambulate more though his dementia may be affecting his ability to complete such. The patient is currently on a statin:  Lipitor. The patient is currently on an anti-platelet: ASA.  The patient can follow up with [place] as needed.  Thank you for allowing [place] to participate in this patient's care.   Adele Barthel, MD, FACS [facility] Office: [phone] Pager: [phone]  11/05/2015, 10:33 AM

## 2015-11-27 ENCOUNTER — Other Ambulatory Visit: Payer: Self-pay | Admitting: Gastroenterology

## 2015-12-17 DIAGNOSIS — E782 Mixed hyperlipidemia: Secondary | ICD-10-CM | POA: Diagnosis not present

## 2015-12-17 DIAGNOSIS — D509 Iron deficiency anemia, unspecified: Secondary | ICD-10-CM | POA: Diagnosis not present

## 2015-12-17 DIAGNOSIS — E039 Hypothyroidism, unspecified: Secondary | ICD-10-CM | POA: Diagnosis not present

## 2015-12-19 ENCOUNTER — Other Ambulatory Visit: Payer: Self-pay

## 2016-01-01 DIAGNOSIS — E039 Hypothyroidism, unspecified: Secondary | ICD-10-CM | POA: Diagnosis not present

## 2016-01-01 DIAGNOSIS — E782 Mixed hyperlipidemia: Secondary | ICD-10-CM | POA: Diagnosis not present

## 2016-01-01 DIAGNOSIS — F0390 Unspecified dementia without behavioral disturbance: Secondary | ICD-10-CM | POA: Diagnosis not present

## 2016-01-01 DIAGNOSIS — D509 Iron deficiency anemia, unspecified: Secondary | ICD-10-CM | POA: Diagnosis not present

## 2016-01-01 DIAGNOSIS — N182 Chronic kidney disease, stage 2 (mild): Secondary | ICD-10-CM | POA: Diagnosis not present

## 2016-02-29 DIAGNOSIS — R6889 Other general symptoms and signs: Secondary | ICD-10-CM | POA: Diagnosis not present

## 2016-02-29 DIAGNOSIS — A084 Viral intestinal infection, unspecified: Secondary | ICD-10-CM | POA: Diagnosis not present

## 2016-03-03 ENCOUNTER — Encounter (HOSPITAL_COMMUNITY): Payer: Self-pay | Admitting: Emergency Medicine

## 2016-03-03 ENCOUNTER — Emergency Department (HOSPITAL_COMMUNITY): Payer: Medicare Other

## 2016-03-03 ENCOUNTER — Emergency Department (HOSPITAL_COMMUNITY)
Admission: EM | Admit: 2016-03-03 | Discharge: 2016-03-03 | Disposition: A | Discharge status: Home / Self Care | Payer: Medicare Other | Source: Home / Self Care | Attending: Emergency Medicine | Admitting: Emergency Medicine

## 2016-03-03 DIAGNOSIS — D696 Thrombocytopenia, unspecified: Secondary | ICD-10-CM | POA: Diagnosis not present

## 2016-03-03 DIAGNOSIS — N182 Chronic kidney disease, stage 2 (mild): Secondary | ICD-10-CM | POA: Insufficient documentation

## 2016-03-03 DIAGNOSIS — I129 Hypertensive chronic kidney disease with stage 1 through stage 4 chronic kidney disease, or unspecified chronic kidney disease: Secondary | ICD-10-CM

## 2016-03-03 DIAGNOSIS — Z87891 Personal history of nicotine dependence: Secondary | ICD-10-CM

## 2016-03-03 DIAGNOSIS — Z79899 Other long term (current) drug therapy: Secondary | ICD-10-CM

## 2016-03-03 DIAGNOSIS — K219 Gastro-esophageal reflux disease without esophagitis: Secondary | ICD-10-CM

## 2016-03-03 DIAGNOSIS — R079 Chest pain, unspecified: Secondary | ICD-10-CM | POA: Diagnosis not present

## 2016-03-03 DIAGNOSIS — I248 Other forms of acute ischemic heart disease: Secondary | ICD-10-CM | POA: Diagnosis not present

## 2016-03-03 DIAGNOSIS — I252 Old myocardial infarction: Secondary | ICD-10-CM | POA: Insufficient documentation

## 2016-03-03 DIAGNOSIS — F0391 Unspecified dementia with behavioral disturbance: Secondary | ICD-10-CM | POA: Diagnosis not present

## 2016-03-03 DIAGNOSIS — R0602 Shortness of breath: Secondary | ICD-10-CM | POA: Diagnosis not present

## 2016-03-03 DIAGNOSIS — K222 Esophageal obstruction: Secondary | ICD-10-CM | POA: Diagnosis not present

## 2016-03-03 DIAGNOSIS — E039 Hypothyroidism, unspecified: Secondary | ICD-10-CM | POA: Insufficient documentation

## 2016-03-03 DIAGNOSIS — N200 Calculus of kidney: Secondary | ICD-10-CM | POA: Diagnosis not present

## 2016-03-03 DIAGNOSIS — K8050 Calculus of bile duct without cholangitis or cholecystitis without obstruction: Secondary | ICD-10-CM | POA: Diagnosis not present

## 2016-03-03 DIAGNOSIS — R1013 Epigastric pain: Secondary | ICD-10-CM | POA: Diagnosis not present

## 2016-03-03 DIAGNOSIS — L89151 Pressure ulcer of sacral region, stage 1: Secondary | ICD-10-CM | POA: Diagnosis not present

## 2016-03-03 LAB — COMPREHENSIVE METABOLIC PANEL
ALT: 189 U/L — AB (ref 17–63)
AST: 200 U/L — AB (ref 15–41)
Albumin: 3.6 g/dL (ref 3.5–5.0)
Alkaline Phosphatase: 240 U/L — ABNORMAL HIGH (ref 38–126)
Anion gap: 10 (ref 5–15)
BILIRUBIN TOTAL: 2 mg/dL — AB (ref 0.3–1.2)
BUN: 14 mg/dL (ref 6–20)
CO2: 26 mmol/L (ref 22–32)
CREATININE: 1.11 mg/dL (ref 0.61–1.24)
Calcium: 9 mg/dL (ref 8.9–10.3)
Chloride: 103 mmol/L (ref 101–111)
GFR calc Af Amer: >60 mL/min (ref >60)
GFR, EST NON AFRICAN AMERICAN: 58 mL/min — AB (ref >60)
GLUCOSE: 165 mg/dL — AB (ref 65–99)
Potassium: 3.3 mmol/L — ABNORMAL LOW (ref 3.5–5.1)
Sodium: 139 mmol/L (ref 135–145)
TOTAL PROTEIN: 6.9 g/dL (ref 6.5–8.1)

## 2016-03-03 LAB — TROPONIN I

## 2016-03-03 LAB — CBC
HEMATOCRIT: 33 % — AB (ref 39.0–52.0)
HEMOGLOBIN: 11.3 g/dL — AB (ref 13.0–17.0)
MCH: 31.7 pg (ref 26.0–34.0)
MCHC: 34.2 g/dL (ref 30.0–36.0)
MCV: 92.4 fL (ref 78.0–100.0)
Platelets: 123 10*3/uL — ABNORMAL LOW (ref 150–400)
RBC: 3.57 MIL/uL — AB (ref 4.22–5.81)
RDW: 13.7 % (ref 11.5–15.5)
WBC: 6 10*3/uL (ref 4.0–10.5)

## 2016-03-03 LAB — LIPASE, BLOOD: Lipase: 21 U/L (ref 11–51)

## 2016-03-03 MED ORDER — GI COCKTAIL ~~LOC~~
30.0000 mL | Freq: Once | ORAL | Status: AC
  Administered 2016-03-03: 30 mL via ORAL
  Filled 2016-03-03: qty 30

## 2016-03-03 MED ORDER — ONDANSETRON HCL 4 MG/2ML IJ SOLN
4.0000 mg | Freq: Once | INTRAMUSCULAR | Status: AC
  Administered 2016-03-03: 4 mg via INTRAVENOUS
  Filled 2016-03-03: qty 2

## 2016-03-03 MED ORDER — FENTANYL CITRATE (PF) 100 MCG/2ML IJ SOLN
50.0000 ug | Freq: Once | INTRAMUSCULAR | Status: AC
  Administered 2016-03-03: 50 ug via INTRAVENOUS
  Filled 2016-03-03: qty 2

## 2016-03-03 NOTE — ED Triage Notes (Signed)
Patient complaining of epigastric pain radiating into chest and back starting approximately 2 hours prior to ER.

## 2016-03-03 NOTE — ED Provider Notes (Signed)
[facility] DEPT Provider Note   CSN: [id number] Arrival date & time: 03/03/16  2027  By signing my name below, I, Jaquelyn Bitter., attest that this documentation has been prepared under the direction and in the presence of Daleen Bo, MD. Electronically signed: Jaquelyn Bitter., ED Scribe. 03/03/16. 10:52 PM.   History   Chief Complaint Chief Complaint  Patient presents with  . Chest Pain    HPI  Raymond [name] is a 83 y.o. male with hx of heart attack (1998) and dementia who presents to the Emergency Department complaining of constant, mild to moderate chest pain with sudden onset x2 hours. Pt states that his chest has been "feeling funny" for the past x2 hours. Per wife, pt has been complaining of abdominal pain that radiates to the chest and back for the past x2 hours.  Pt was reportedly seen at Urgent Care x3 days ago for a GI flare with symptoms of diaphoresis, loose stools, emesis and fever. Pt reports unproductive cough, SOB. Pt states that the pain is relieved with lying down. Pt denies loose bowels. He denies hx of HTN, diabetes mellitus. Per wife, pt is able to ambuklate but shuffles when he walks.Reports unproductive cough, SOB,    The history is provided by the patient and the spouse. No language interpreter was used.    Past Medical History:  Diagnosis Date  . Anemia   . ASCVD (arteriosclerotic cardiovascular disease)    2 MI's in 1990s  . Chronic renal insufficiency, stage II (mild)    CrCl in the 60s  . Dementia   . DJD (degenerative joint disease)   . Esophageal dysphagia    Secondary to Schatzki's ring with dilation x2, most recently in 06/09, negative for H. pylori  . GERD (gastroesophageal reflux disease)   . HTN (hypertension)    BP meds stopped 2016 due to recurrent orthostatic hypotension  . Hyperlipidemia   . Hypothyroidism   . Mild cognitive impairment with memory loss   . Myocardial infarction 08-1996   Cardiac cath done but  no other intervention that we know of  . Orthostatic hypotension   . Psoriasis   . Thrombocytopenia ([place])    Platelets 99K on 09/2009 labs  . Ulcerative proctitis ([place])    Dx: 2000, started Asacol at that time.  Was taken off asacol 2014 and has done fine since (Dr. Sydell Axon, [facility])    Patient Active Problem List   Diagnosis Date Noted  . GERD (gastroesophageal reflux disease) 10/04/2014  . Fatigue 09/09/2014  . Low blood pressure 05/03/2013  . Dementia 04/16/2013  . Hyperlipidemia 04/16/2013  . Esophageal dysphagia 06/23/2012  . Other and unspecified noninfectious gastroenteritis and colitis(558.9) 06/23/2012  . Unspecified essential hypertension 04/08/2012  . Heart attack   . Hypothyroidism 10/15/2009  . Psoriasis 10/15/2009  . HYPERLIPIDEMIA 09/11/2009  . PAD (peripheral artery disease) ([place]) 09/11/2009  . SCHATZKI'S RING 09/11/2009  . DIVERTICULAR DISEASE 09/11/2009    Past Surgical History:  Procedure Laterality Date  . APPENDECTOMY  1940  . CLEFT LIP REPAIR     As a child  . COLECTOMY  2004   Colonovesicular fistula secondary to diverticulitis requiring sigmoid colectomy and bladder repair  . COLONOSCOPY  06/2007   Dr. Olegario Messier: Few diverticula, normal anastomosiss/p sigmoid colectomy for diverticulitis  . colovesicular fistula  2004  . ESOPHAGEAL DILATION  06-23-07; 06/28/12   H pylori NEG.  2014-reactive gastropathy with surface erosion.  . ESOPHAGOGASTRODUODENOSCOPY  06/2007  Dr. Olegario Messier: symptomatic Schatzki ring, s/p 17mm Savary dilator, Erosive duodenitis, s/p bx   . ESOPHAGOGASTRODUODENOSCOPY (EGD) WITH ESOPHAGEAL DILATION N/A 06/28/2012   [id number] cervical web and distal esophageal s/p dilation/Hiatal hernia. Antral erosions (reactive gastropathy, no H.pylori)  . HERNIA REPAIR    . NECK MASS EXCISION  1979       Home Medications    Prior to Admission medications   Medication Sig Start Date End Date Taking? Authorizing Provider    acetaminophen (TYLENOL) 500 MG tablet Take 500 mg by mouth every 6 (six) hours as needed for moderate pain.   Yes Historical Provider, MD  aspirin 81 MG tablet Take 81 mg by mouth daily.     Yes Historical Provider, MD  atorvastatin (LIPITOR) 20 MG tablet TAKE ONE-HALF TABLET BY MOUTH ONCE DAILY 07/18/14  Yes Tammi Sou, MD  donepezil (ARICEPT) 10 MG tablet TAKE ONE TABLET BY MOUTH ONCE DAILY AT BEDTIME 10/18/14  Yes Tammi Sou, MD  levothyroxine (SYNTHROID, LEVOTHROID) 112 MCG tablet TAKE ONE TABLET BY MOUTH ONCE DAILY BEFORE BREAKFAST 11/22/14  Yes Renee A Kuneff, DO  niacin 500 MG CR capsule Take 2 capsules (1,000 mg total) by mouth daily. 08/23/13  Yes Tammi Sou, MD  nitroGLYCERIN (NITROSTAT) 0.4 MG SL tablet Place 1 tablet (0.4 mg total) under the tongue every 5 (five) minutes as needed for chest pain. 06/26/14  Yes Tammi Sou, MD  ondansetron (ZOFRAN-ODT) 4 MG disintegrating tablet Take 1 tablet by mouth daily as needed for nausea/vomiting. 02/29/16  Yes Historical Provider, MD  pantoprazole (PROTONIX) 40 MG tablet TAKE ONE TABLET BY MOUTH ONCE DAILY BEFORE BREAKFAST 11/28/15  Yes Mahala Menghini, PA-C    Family History Family History  Problem Relation Age of Onset  . Cancer Father     pt doesn't remember type  . Heart disease Daughter     heart defect: d age 60    Social History Social History  Substance Use Topics  . Smoking status: Former Smoker    Packs/day: 1.00    Years: 10.00    Types: Cigarettes    Quit date: 01/11/1970  . Smokeless tobacco: Never Used     Comment: Minimal use at 20  . Alcohol use No     Allergies   Patient has no known allergies.   Review of Systems Review of Systems  Constitutional: Negative for fever.  Respiratory: Positive for cough and shortness of breath.   Gastrointestinal: Negative for diarrhea.  All other systems reviewed and are negative.    Physical Exam Updated Vital Signs BP 194/99 (BP Location: Right Arm)    Pulse 88   Temp 98.3 F (36.8 C) (Oral)   Resp 22   Ht 5\' 11"  (1.803 m)   Wt 148 lb (67.1 kg)   SpO2 99%   BMI 20.64 kg/m   Physical Exam  Constitutional: He is oriented to person, place, and time. He appears well-developed and well-nourished.  HENT:  Head: Normocephalic and atraumatic.  Right Ear: External ear normal.  Left Ear: External ear normal.  Mouth/Throat: Mucous membranes are dry (slightly).  Eyes: Conjunctivae and EOM are normal. Pupils are equal, round, and reactive to light.  Neck: Normal range of motion and phonation normal. Neck supple.  Cardiovascular: Normal rate and normal heart sounds.  An irregular rhythm present.  Pulmonary/Chest: Effort normal and breath sounds normal. He exhibits no bony tenderness.  Abdominal: Soft. There is no tenderness.  Musculoskeletal: Normal range of motion.  No lower extremity edema   Neurological: He is alert and oriented to person, place, and time. No cranial nerve deficit or sensory deficit. He exhibits normal muscle tone. Coordination normal.  Skin: Skin is warm, dry and intact.  Psychiatric: He has a normal mood and affect. His behavior is normal. Judgment and thought content normal.  Nursing note and vitals reviewed.    ED Treatments / Results   DIAGNOSTIC STUDIES: Oxygen Saturation is 99% on RA, normal by my interpretation.   COORDINATION OF CARE: 10:52 PM-Discussed next steps with pt. Pt verbalized understanding and is agreeable with the plan.    Labs (all labs ordered are listed, but only abnormal results are displayed) Labs Reviewed  CBC - Abnormal; Notable for the following:       Result Value   RBC 3.57 (*)    Hemoglobin 11.3 (*)    HCT 33.0 (*)    Platelets 123 (*)    All other components within normal limits  COMPREHENSIVE METABOLIC PANEL - Abnormal; Notable for the following:    Potassium 3.3 (*)    Glucose, Bld 165 (*)    AST 200 (*)    ALT 189 (*)    Alkaline Phosphatase 240 (*)    Total  Bilirubin 2.0 (*)    GFR calc non Af Amer 58 (*)    All other components within normal limits  TROPONIN I  LIPASE, BLOOD    EKG  EKG Interpretation  Date/Time:  Wednesday March 03 2016 20:35:42 EST Ventricular Rate:  88 PR Interval:    QRS Duration: 84 QT Interval:  438 QTC Calculation: 530 R Axis:   -31 Text Interpretation:  Sinus arrhythmia Multiform ventricular premature complexes Abnormal R-wave progression, early transition Inferior infarct, old Prolonged QT interval No old tracing to compare Confirmed by [facility]  MD, Lenaya Pietsch [phone]) on 03/03/2016 9:07:23 PM       Radiology Dg Chest Portable 1 View  Result Date: 03/03/2016 CLINICAL DATA:  83 y/o M; mid lower chest pain and shortness of breath. EXAM: PORTABLE CHEST 1 VIEW COMPARISON:  12/12/2014 chest radiograph. FINDINGS: Stable heart size and mediastinal contours are within normal limits. Aortic atherosclerosis with calcification. Stable biapical mild pleuroparenchymal scarring. Both lungs are clear. The visualized skeletal structures are unremarkable. IMPRESSION: No active disease. Electronically Signed   By: Kristine Garbe M.D.   On: 03/03/2016 21:08    Procedures Procedures (including critical care time)  Medications Ordered in ED Medications  fentaNYL (SUBLIMAZE) injection 50 mcg (50 mcg Intravenous Given 03/03/16 2135)  ondansetron (ZOFRAN) injection 4 mg (4 mg Intravenous Given 03/03/16 2134)  gi cocktail (Maalox,Lidocaine,Donnatal) (30 mLs Oral Given 03/03/16 2134)     Initial Impression / Assessment and Plan / ED Course  I have reviewed the triage vital signs and the nursing notes.  Pertinent labs & imaging results that were available during my care of the patient were reviewed by me and considered in my medical decision making (see chart for details).     Medications  fentaNYL (SUBLIMAZE) injection 50 mcg (50 mcg Intravenous Given 03/03/16 2135)  ondansetron [facility]) injection 4 mg (4 mg  Intravenous Given 03/03/16 2134)  gi cocktail (Maalox,Lidocaine,Donnatal) (30 mLs Oral Given 03/03/16 2134)    Patient Vitals for the past 24 hrs:  BP Temp Temp src Pulse Resp SpO2 Height Weight  03/03/16 2139 194/99 - - 88 22 99 % - -  03/03/16 2130 195/95 - - 84 21 100 % - -  03/03/16 2036 164/89  98.3 F (36.8 C) Oral 82 (!) 27 99 % - -  03/03/16 2034 - - - - - - 5\' 11"  (1.803 m) 148 lb (67.1 kg)    10:51 PM Reevaluation with update and discussion. After initial assessment and treatment, an updated evaluation reveals the patient reports complete resolution of his symptoms following treatment here.  Lungs have scattered rhonchi.  No wheezes or rales in the lungs.  Abdomen soft and nontender.  Chest nontender to palpation.  Findings discussed with the patient and his wife, all questions were answered. Hassan Blackshire L    Final Clinical Impressions(s) / ED Diagnoses   Final diagnoses:  Gastroesophageal reflux disease, esophagitis presence not specified   Upper abdomen and chest pain, with findings consistent with esophageal reflux.  This is likely related to his recent URI.  Doubt ACS PE or pneumonia.   Nursing Notes Reviewed/ Care Coordinated Applicable Imaging Reviewed Interpretation of Laboratory Data incorporated into ED treatment  The patient appears reasonably screened and/or stabilized for discharge and I doubt any other medical condition or other [facility] requiring further screening, evaluation, or treatment in the ED at this time prior to discharge.  Plan: Home Medications-Maalox before meals and at bedtime continue regular medications; Home Treatments-rest, fluids; return here if the recommended treatment, does not improve the symptoms; Recommended follow up-PCP follow-up 1 week and as needed       New Prescriptions New Prescriptions   No medications on file   I personally performed the services described in this documentation, which was scribed in my presence. The recorded  information has been reviewed and is accurate.    Daleen Bo, MD 03/03/16 2253

## 2016-03-03 NOTE — Discharge Instructions (Signed)
Use Maalox or Mylanta, 2 tablespoons, before meals and at bedtime for 1 week.  Call your doctor for a follow-up appointment next week  Return here, if needed, for problems.

## 2016-03-04 ENCOUNTER — Encounter (HOSPITAL_COMMUNITY): Payer: Self-pay

## 2016-03-04 ENCOUNTER — Emergency Department (HOSPITAL_COMMUNITY): Payer: Medicare Other

## 2016-03-04 ENCOUNTER — Inpatient Hospital Stay (HOSPITAL_COMMUNITY)
Admission: EM | Admit: 2016-03-04 | Discharge: 2016-03-09 | DRG: 445 | Disposition: A | Discharge status: Skilled Nursing Facility | Payer: Medicare Other | Attending: Family Medicine | Admitting: Family Medicine

## 2016-03-04 DIAGNOSIS — R945 Abnormal results of liver function studies: Secondary | ICD-10-CM | POA: Diagnosis present

## 2016-03-04 DIAGNOSIS — R4182 Altered mental status, unspecified: Secondary | ICD-10-CM | POA: Diagnosis not present

## 2016-03-04 DIAGNOSIS — K838 Other specified diseases of biliary tract: Secondary | ICD-10-CM | POA: Diagnosis present

## 2016-03-04 DIAGNOSIS — I129 Hypertensive chronic kidney disease with stage 1 through stage 4 chronic kidney disease, or unspecified chronic kidney disease: Secondary | ICD-10-CM | POA: Diagnosis not present

## 2016-03-04 DIAGNOSIS — R17 Unspecified jaundice: Secondary | ICD-10-CM | POA: Diagnosis not present

## 2016-03-04 DIAGNOSIS — R1311 Dysphagia, oral phase: Secondary | ICD-10-CM | POA: Diagnosis not present

## 2016-03-04 DIAGNOSIS — I252 Old myocardial infarction: Secondary | ICD-10-CM | POA: Diagnosis not present

## 2016-03-04 DIAGNOSIS — I2510 Atherosclerotic heart disease of native coronary artery without angina pectoris: Secondary | ICD-10-CM | POA: Diagnosis present

## 2016-03-04 DIAGNOSIS — E876 Hypokalemia: Secondary | ICD-10-CM | POA: Diagnosis present

## 2016-03-04 DIAGNOSIS — Z79899 Other long term (current) drug therapy: Secondary | ICD-10-CM | POA: Diagnosis not present

## 2016-03-04 DIAGNOSIS — R262 Difficulty in walking, not elsewhere classified: Secondary | ICD-10-CM | POA: Diagnosis not present

## 2016-03-04 DIAGNOSIS — K8050 Calculus of bile duct without cholangitis or cholecystitis without obstruction: Principal | ICD-10-CM | POA: Diagnosis present

## 2016-03-04 DIAGNOSIS — R1013 Epigastric pain: Secondary | ICD-10-CM | POA: Diagnosis not present

## 2016-03-04 DIAGNOSIS — R748 Abnormal levels of other serum enzymes: Secondary | ICD-10-CM | POA: Diagnosis not present

## 2016-03-04 DIAGNOSIS — K219 Gastro-esophageal reflux disease without esophagitis: Secondary | ICD-10-CM | POA: Diagnosis present

## 2016-03-04 DIAGNOSIS — F0390 Unspecified dementia without behavioral disturbance: Secondary | ICD-10-CM | POA: Diagnosis not present

## 2016-03-04 DIAGNOSIS — D631 Anemia in chronic kidney disease: Secondary | ICD-10-CM | POA: Diagnosis not present

## 2016-03-04 DIAGNOSIS — K222 Esophageal obstruction: Secondary | ICD-10-CM | POA: Diagnosis not present

## 2016-03-04 DIAGNOSIS — R278 Other lack of coordination: Secondary | ICD-10-CM | POA: Diagnosis not present

## 2016-03-04 DIAGNOSIS — I739 Peripheral vascular disease, unspecified: Secondary | ICD-10-CM | POA: Diagnosis present

## 2016-03-04 DIAGNOSIS — I248 Other forms of acute ischemic heart disease: Secondary | ICD-10-CM | POA: Diagnosis present

## 2016-03-04 DIAGNOSIS — E785 Hyperlipidemia, unspecified: Secondary | ICD-10-CM | POA: Diagnosis present

## 2016-03-04 DIAGNOSIS — Z7982 Long term (current) use of aspirin: Secondary | ICD-10-CM | POA: Diagnosis not present

## 2016-03-04 DIAGNOSIS — R7989 Other specified abnormal findings of blood chemistry: Secondary | ICD-10-CM

## 2016-03-04 DIAGNOSIS — R1084 Generalized abdominal pain: Secondary | ICD-10-CM | POA: Diagnosis not present

## 2016-03-04 DIAGNOSIS — D696 Thrombocytopenia, unspecified: Secondary | ICD-10-CM | POA: Diagnosis present

## 2016-03-04 DIAGNOSIS — Z87891 Personal history of nicotine dependence: Secondary | ICD-10-CM | POA: Diagnosis not present

## 2016-03-04 DIAGNOSIS — F0391 Unspecified dementia with behavioral disturbance: Secondary | ICD-10-CM | POA: Diagnosis not present

## 2016-03-04 DIAGNOSIS — E039 Hypothyroidism, unspecified: Secondary | ICD-10-CM | POA: Diagnosis present

## 2016-03-04 DIAGNOSIS — L409 Psoriasis, unspecified: Secondary | ICD-10-CM | POA: Diagnosis not present

## 2016-03-04 DIAGNOSIS — K8051 Calculus of bile duct without cholangitis or cholecystitis with obstruction: Secondary | ICD-10-CM | POA: Diagnosis not present

## 2016-03-04 DIAGNOSIS — L89151 Pressure ulcer of sacral region, stage 1: Secondary | ICD-10-CM | POA: Diagnosis present

## 2016-03-04 DIAGNOSIS — I10 Essential (primary) hypertension: Secondary | ICD-10-CM | POA: Diagnosis not present

## 2016-03-04 DIAGNOSIS — D638 Anemia in other chronic diseases classified elsewhere: Secondary | ICD-10-CM | POA: Diagnosis not present

## 2016-03-04 DIAGNOSIS — R778 Other specified abnormalities of plasma proteins: Secondary | ICD-10-CM | POA: Diagnosis present

## 2016-03-04 DIAGNOSIS — K831 Obstruction of bile duct: Secondary | ICD-10-CM | POA: Diagnosis not present

## 2016-03-04 DIAGNOSIS — L8990 Pressure ulcer of unspecified site, unspecified stage: Secondary | ICD-10-CM | POA: Insufficient documentation

## 2016-03-04 DIAGNOSIS — N182 Chronic kidney disease, stage 2 (mild): Secondary | ICD-10-CM | POA: Diagnosis present

## 2016-03-04 DIAGNOSIS — N200 Calculus of kidney: Secondary | ICD-10-CM | POA: Diagnosis not present

## 2016-03-04 DIAGNOSIS — K5710 Diverticulosis of small intestine without perforation or abscess without bleeding: Secondary | ICD-10-CM | POA: Diagnosis present

## 2016-03-04 LAB — CBC WITH DIFFERENTIAL/PLATELET
BASOS PCT: 0 %
Basophils Absolute: 0 10*3/uL (ref 0.0–0.1)
EOS PCT: 0 %
Eosinophils Absolute: 0 10*3/uL (ref 0.0–0.7)
HEMATOCRIT: 38.1 % — AB (ref 39.0–52.0)
Hemoglobin: 13 g/dL (ref 13.0–17.0)
Lymphocytes Relative: 11 %
Lymphs Abs: 0.8 10*3/uL (ref 0.7–4.0)
MCH: 31.2 pg (ref 26.0–34.0)
MCHC: 34.1 g/dL (ref 30.0–36.0)
MCV: 91.4 fL (ref 78.0–100.0)
MONO ABS: 0.5 10*3/uL (ref 0.1–1.0)
MONOS PCT: 7 %
NEUTROS ABS: 6.2 10*3/uL (ref 1.7–7.7)
Neutrophils Relative %: 82 %
Platelets: 129 10*3/uL — ABNORMAL LOW (ref 150–400)
RBC: 4.17 MIL/uL — ABNORMAL LOW (ref 4.22–5.81)
RDW: 13.5 % (ref 11.5–15.5)
WBC: 7.6 10*3/uL (ref 4.0–10.5)

## 2016-03-04 LAB — COMPREHENSIVE METABOLIC PANEL
ALBUMIN: 4 g/dL (ref 3.5–5.0)
ALK PHOS: 323 U/L — AB (ref 38–126)
ALT: 276 U/L — AB (ref 17–63)
AST: 314 U/L — AB (ref 15–41)
Anion gap: 13 (ref 5–15)
BILIRUBIN TOTAL: 6.8 mg/dL — AB (ref 0.3–1.2)
BUN: 16 mg/dL (ref 6–20)
CO2: 25 mmol/L (ref 22–32)
CREATININE: 1 mg/dL (ref 0.61–1.24)
Calcium: 9.5 mg/dL (ref 8.9–10.3)
Chloride: 99 mmol/L — ABNORMAL LOW (ref 101–111)
GFR calc Af Amer: >60 mL/min (ref >60)
GFR calc non Af Amer: >60 mL/min (ref >60)
GLUCOSE: 151 mg/dL — AB (ref 65–99)
POTASSIUM: 3.5 mmol/L (ref 3.5–5.1)
Sodium: 137 mmol/L (ref 135–145)
TOTAL PROTEIN: 8.1 g/dL (ref 6.5–8.1)

## 2016-03-04 LAB — LIPASE, BLOOD: Lipase: 15 U/L (ref 11–51)

## 2016-03-04 LAB — TROPONIN I: Troponin I: 0.03 ng/mL (ref <0.03)

## 2016-03-04 MED ORDER — ONDANSETRON HCL 4 MG/2ML IJ SOLN
4.0000 mg | Freq: Once | INTRAMUSCULAR | Status: AC
  Administered 2016-03-04: 4 mg via INTRAVENOUS
  Filled 2016-03-04: qty 2

## 2016-03-04 MED ORDER — MORPHINE SULFATE (PF) 4 MG/ML IV SOLN
4.0000 mg | Freq: Once | INTRAVENOUS | Status: AC
  Administered 2016-03-04: 4 mg via INTRAVENOUS
  Filled 2016-03-04: qty 1

## 2016-03-04 MED ORDER — IOPAMIDOL (ISOVUE-300) INJECTION 61%
100.0000 mL | Freq: Once | INTRAVENOUS | Status: AC | PRN
  Administered 2016-03-04: 100 mL via INTRAVENOUS

## 2016-03-04 MED ORDER — PIPERACILLIN-TAZOBACTAM 3.375 G IVPB
3.3750 g | Freq: Once | INTRAVENOUS | Status: AC
  Administered 2016-03-04: 3.375 g via INTRAVENOUS
  Filled 2016-03-04: qty 50

## 2016-03-04 MED ORDER — SODIUM CHLORIDE 0.9 % IV BOLUS (SEPSIS)
500.0000 mL | Freq: Once | INTRAVENOUS | Status: AC
  Administered 2016-03-04: 500 mL via INTRAVENOUS

## 2016-03-04 MED ORDER — IOPAMIDOL (ISOVUE-300) INJECTION 61%
30.0000 mL | Freq: Once | INTRAVENOUS | Status: AC | PRN
  Administered 2016-03-04: 30 mL via ORAL

## 2016-03-04 NOTE — ED Notes (Signed)
Pt back from CT

## 2016-03-04 NOTE — ED Notes (Signed)
Patient transported to CT 

## 2016-03-04 NOTE — ED Provider Notes (Signed)
[facility] DEPT Provider Note   CSN: [id number] Arrival date & time: 03/04/16  1834     History   Chief Complaint Chief Complaint  Patient presents with  . Chest Pain    HPI Raymond [name] is a 83 y.o. male.  Patient is an 83 year old male with history of esophageal dysphagia, dementia, and peripheral artery disease. He presents for evaluation of epigastric pain. This apparently started 2 hours prior to presentation. This is the third episode of this in the past week. He was initially seen in urgent care, however nothing was found. He presented here last night and underwent laboratory studies, however nothing was found. His pain started again this evening approximately 2 hours prior to presentation. The pain is located in the epigastrium and radiates to the back. He denies any fevers or chills. Denies any nausea or vomiting.  History is somewhat limited secondary to the patient's dementia. His wife contributes the majority of the information.   The history is provided by the patient and the spouse.  Chest Pain   This is a recurrent problem. Episode onset: 2 hours ago. The problem occurs constantly. The problem has not changed since onset.The pain is moderate. The quality of the pain is described as stabbing. The pain does not radiate. Associated symptoms include abdominal pain. Pertinent negatives include no shortness of breath, no sputum production and no vomiting. He has tried nothing for the symptoms.    Past Medical History:  Diagnosis Date  . Anemia   . ASCVD (arteriosclerotic cardiovascular disease)    2 MI's in 1990s  . Chronic renal insufficiency, stage II (mild)    CrCl in the 60s  . Dementia   . DJD (degenerative joint disease)   . Esophageal dysphagia    Secondary to Schatzki's ring with dilation x2, most recently in 06/09, negative for H. pylori  . GERD (gastroesophageal reflux disease)   . HTN (hypertension)    BP meds stopped 2016 due to recurrent  orthostatic hypotension  . Hyperlipidemia   . Hypothyroidism   . Mild cognitive impairment with memory loss   . Myocardial infarction 08-1996   Cardiac cath done but no other intervention that we know of  . Orthostatic hypotension   . Psoriasis   . Thrombocytopenia ([place])    Platelets 99K on 09/2009 labs  . Ulcerative proctitis ([place])    Dx: 2000, started Asacol at that time.  Was taken off asacol 2014 and has done fine since (Dr. Sydell Axon, [facility])    Patient Active Problem List   Diagnosis Date Noted  . GERD (gastroesophageal reflux disease) 10/04/2014  . Fatigue 09/09/2014  . Low blood pressure 05/03/2013  . Dementia 04/16/2013  . Hyperlipidemia 04/16/2013  . Esophageal dysphagia 06/23/2012  . Other and unspecified noninfectious gastroenteritis and colitis(558.9) 06/23/2012  . Unspecified essential hypertension 04/08/2012  . Heart attack   . Hypothyroidism 10/15/2009  . Psoriasis 10/15/2009  . HYPERLIPIDEMIA 09/11/2009  . PAD (peripheral artery disease) ([place]) 09/11/2009  . SCHATZKI'S RING 09/11/2009  . DIVERTICULAR DISEASE 09/11/2009    Past Surgical History:  Procedure Laterality Date  . APPENDECTOMY  1940  . CLEFT LIP REPAIR     As a child  . COLECTOMY  2004   Colonovesicular fistula secondary to diverticulitis requiring sigmoid colectomy and bladder repair  . COLONOSCOPY  06/2007   Dr. Olegario Messier: Few diverticula, normal anastomosiss/p sigmoid colectomy for diverticulitis  . colovesicular fistula  2004  . ESOPHAGEAL DILATION  06-23-07; 06/28/12  H pylori NEG.  2014-reactive gastropathy with surface erosion.  . ESOPHAGOGASTRODUODENOSCOPY  06/2007   Dr. Olegario Messier: symptomatic Schatzki ring, s/p 23mm Savary dilator, Erosive duodenitis, s/p bx   . ESOPHAGOGASTRODUODENOSCOPY (EGD) WITH ESOPHAGEAL DILATION N/A 06/28/2012   [id number] cervical web and distal esophageal s/p dilation/Hiatal hernia. Antral erosions (reactive gastropathy, no H.pylori)  . HERNIA  REPAIR    . NECK MASS EXCISION  1979       Home Medications    Prior to Admission medications   Medication Sig Start Date End Date Taking? Authorizing Provider  acetaminophen (TYLENOL) 500 MG tablet Take 500 mg by mouth every 6 (six) hours as needed for moderate pain.    Historical Provider, MD  aspirin 81 MG tablet Take 81 mg by mouth daily.      Historical Provider, MD  atorvastatin (LIPITOR) 20 MG tablet TAKE ONE-HALF TABLET BY MOUTH ONCE DAILY 07/18/14   Tammi Sou, MD  donepezil (ARICEPT) 10 MG tablet TAKE ONE TABLET BY MOUTH ONCE DAILY AT BEDTIME 10/18/14   Tammi Sou, MD  levothyroxine (SYNTHROID, LEVOTHROID) 112 MCG tablet TAKE ONE TABLET BY MOUTH ONCE DAILY BEFORE BREAKFAST 11/22/14   Renee A Kuneff, DO  niacin 500 MG CR capsule Take 2 capsules (1,000 mg total) by mouth daily. 08/23/13   Tammi Sou, MD  nitroGLYCERIN (NITROSTAT) 0.4 MG SL tablet Place 1 tablet (0.4 mg total) under the tongue every 5 (five) minutes as needed for chest pain. 06/26/14   Tammi Sou, MD  ondansetron (ZOFRAN-ODT) 4 MG disintegrating tablet Take 1 tablet by mouth daily as needed for nausea/vomiting. 02/29/16   Historical Provider, MD  pantoprazole (PROTONIX) 40 MG tablet TAKE ONE TABLET BY MOUTH ONCE DAILY BEFORE BREAKFAST 11/28/15   Mahala Menghini, PA-C    Family History Family History  Problem Relation Age of Onset  . Cancer Father     pt doesn't remember type  . Heart disease Daughter     heart defect: d age 54    Social History Social History  Substance Use Topics  . Smoking status: Former Smoker    Packs/day: 1.00    Years: 10.00    Types: Cigarettes    Quit date: 01/11/1970  . Smokeless tobacco: Never Used     Comment: Minimal use at 20  . Alcohol use No     Allergies   Patient has no known allergies.   Review of Systems Review of Systems  Respiratory: Negative for sputum production and shortness of breath.   Cardiovascular: Positive for chest pain.    Gastrointestinal: Positive for abdominal pain. Negative for vomiting.  All other systems reviewed and are negative.    Physical Exam Updated Vital Signs BP (!) 207/107   Pulse 89   Temp 97.7 F (36.5 C) (Oral)   Resp 21   Ht 5\' 11"  (1.803 m)   Wt 148 lb (67.1 kg)   SpO2 97%   BMI 20.64 kg/m   Physical Exam  Constitutional: He is oriented to person, place, and time. He appears well-developed and well-nourished. No distress.  HENT:  Head: Normocephalic and atraumatic.  Mouth/Throat: Oropharynx is clear and moist.  Neck: Normal range of motion. Neck supple.  Cardiovascular: Normal rate and regular rhythm.  Exam reveals no friction rub.   No murmur heard. Pulmonary/Chest: Effort normal and breath sounds normal. No respiratory distress. He has no wheezes. He has no rales.  Abdominal: Soft. Bowel sounds are normal. He exhibits no distension. There  is tenderness. There is no rebound and no guarding.  Resume the epigastrium.  Musculoskeletal: Normal range of motion. He exhibits no edema.  Neurological: He is alert and oriented to person, place, and time. Coordination normal.  Skin: Skin is warm and dry. He is not diaphoretic.  Nursing note and vitals reviewed.    ED Treatments / Results  Labs (all labs ordered are listed, but only abnormal results are displayed) Labs Reviewed  COMPREHENSIVE METABOLIC PANEL  LIPASE, BLOOD  CBC WITH DIFFERENTIAL/PLATELET  TROPONIN I    EKG  EKG Interpretation None       Radiology Dg Chest Portable 1 View  Result Date: 03/03/2016 CLINICAL DATA:  83 y/o M; mid lower chest pain and shortness of breath. EXAM: PORTABLE CHEST 1 VIEW COMPARISON:  12/12/2014 chest radiograph. FINDINGS: Stable heart size and mediastinal contours are within normal limits. Aortic atherosclerosis with calcification. Stable biapical mild pleuroparenchymal scarring. Both lungs are clear. The visualized skeletal structures are unremarkable. IMPRESSION: No active  disease. Electronically Signed   By: Kristine Garbe M.D.   On: 03/03/2016 21:08    Procedures Procedures (including critical care time)  Medications Ordered in ED Medications  sodium chloride 0.9 % bolus 500 mL (not administered)  morphine 4 MG/ML injection 4 mg (not administered)  ondansetron (ZOFRAN) injection 4 mg (not administered)     Initial Impression / Assessment and Plan / ED Course  I have reviewed the triage vital signs and the nursing notes.  Pertinent labs & imaging results that were available during my care of the patient were reviewed by me and considered in my medical decision making (see chart for details).  Patient presents with epigastric pain. His workup reveals elevated transaminases, alkaline phosphatase, and bilirubin. CT scan shows what appears to be a mass or a stone in the lower common bile duct that measures 12 mm in diameter. There is hepatic and intrahepatic ductal dilatation throughout. This finding was discussed with Dr. Laural Golden from gastroenterology who is recommending Zosyn to cover for possible cholangitis. He would also like the patient to be nothing by mouth after midnight and admitted to the hospitalist for ERCP in the morning.  Patient to be admitted under the care of Dr. Shanon Brow.  Final Clinical Impressions(s) / ED Diagnoses   Final diagnoses:  None    New Prescriptions New Prescriptions   No medications on file     Veryl Speak, MD 03/04/16 2237

## 2016-03-04 NOTE — ED Notes (Signed)
ED Provider at bedside. 

## 2016-03-04 NOTE — ED Triage Notes (Signed)
Reports of central chest pain with shortness that breath.  States he was seen here last night and felt better, pain started again this early afternoon and has progressed.

## 2016-03-05 ENCOUNTER — Encounter (HOSPITAL_COMMUNITY): Payer: Self-pay | Admitting: *Deleted

## 2016-03-05 DIAGNOSIS — E876 Hypokalemia: Secondary | ICD-10-CM | POA: Diagnosis not present

## 2016-03-05 DIAGNOSIS — L409 Psoriasis, unspecified: Secondary | ICD-10-CM

## 2016-03-05 DIAGNOSIS — R7989 Other specified abnormal findings of blood chemistry: Secondary | ICD-10-CM

## 2016-03-05 DIAGNOSIS — R1013 Epigastric pain: Secondary | ICD-10-CM

## 2016-03-05 DIAGNOSIS — K8050 Calculus of bile duct without cholangitis or cholecystitis without obstruction: Principal | ICD-10-CM

## 2016-03-05 DIAGNOSIS — R748 Abnormal levels of other serum enzymes: Secondary | ICD-10-CM

## 2016-03-05 DIAGNOSIS — K838 Other specified diseases of biliary tract: Secondary | ICD-10-CM

## 2016-03-05 DIAGNOSIS — D696 Thrombocytopenia, unspecified: Secondary | ICD-10-CM | POA: Diagnosis present

## 2016-03-05 DIAGNOSIS — R778 Other specified abnormalities of plasma proteins: Secondary | ICD-10-CM | POA: Diagnosis present

## 2016-03-05 DIAGNOSIS — K831 Obstruction of bile duct: Secondary | ICD-10-CM

## 2016-03-05 DIAGNOSIS — K8051 Calculus of bile duct without cholangitis or cholecystitis with obstruction: Secondary | ICD-10-CM

## 2016-03-05 DIAGNOSIS — R17 Unspecified jaundice: Secondary | ICD-10-CM

## 2016-03-05 DIAGNOSIS — L8990 Pressure ulcer of unspecified site, unspecified stage: Secondary | ICD-10-CM | POA: Insufficient documentation

## 2016-03-05 DIAGNOSIS — F0390 Unspecified dementia without behavioral disturbance: Secondary | ICD-10-CM

## 2016-03-05 LAB — COMPREHENSIVE METABOLIC PANEL
ALT: 189 U/L — ABNORMAL HIGH (ref 17–63)
AST: 192 U/L — AB (ref 15–41)
Albumin: 3 g/dL — ABNORMAL LOW (ref 3.5–5.0)
Alkaline Phosphatase: 226 U/L — ABNORMAL HIGH (ref 38–126)
Anion gap: 7 (ref 5–15)
BUN: 14 mg/dL (ref 6–20)
CHLORIDE: 104 mmol/L (ref 101–111)
CO2: 27 mmol/L (ref 22–32)
Calcium: 8.1 mg/dL — ABNORMAL LOW (ref 8.9–10.3)
Creatinine, Ser: 0.97 mg/dL (ref 0.61–1.24)
GFR calc Af Amer: >60 mL/min (ref >60)
Glucose, Bld: 112 mg/dL — ABNORMAL HIGH (ref 65–99)
POTASSIUM: 3.1 mmol/L — AB (ref 3.5–5.1)
SODIUM: 138 mmol/L (ref 135–145)
Total Bilirubin: 4.8 mg/dL — ABNORMAL HIGH (ref 0.3–1.2)
Total Protein: 6.1 g/dL — ABNORMAL LOW (ref 6.5–8.1)

## 2016-03-05 LAB — PROTIME-INR
INR: 1.23
Prothrombin Time: 15.6 seconds — ABNORMAL HIGH (ref 11.4–15.2)

## 2016-03-05 LAB — CBC
HEMATOCRIT: 30.1 % — AB (ref 39.0–52.0)
Hemoglobin: 10.5 g/dL — ABNORMAL LOW (ref 13.0–17.0)
MCH: 32.1 pg (ref 26.0–34.0)
MCHC: 34.9 g/dL (ref 30.0–36.0)
MCV: 92 fL (ref 78.0–100.0)
Platelets: 99 10*3/uL — ABNORMAL LOW (ref 150–400)
RBC: 3.27 MIL/uL — AB (ref 4.22–5.81)
RDW: 13.7 % (ref 11.5–15.5)
WBC: 6.7 10*3/uL (ref 4.0–10.5)

## 2016-03-05 LAB — TROPONIN I: TROPONIN I: 0.07 ng/mL — AB (ref <0.03)

## 2016-03-05 MED ORDER — POTASSIUM CHLORIDE 10 MEQ/100ML IV SOLN
10.0000 meq | INTRAVENOUS | Status: AC
  Administered 2016-03-05 (×2): 10 meq via INTRAVENOUS
  Filled 2016-03-05: qty 100

## 2016-03-05 MED ORDER — ONDANSETRON HCL 4 MG/2ML IJ SOLN: 4.0000 mg | Freq: Four times a day (QID) | INTRAMUSCULAR | Status: DC | PRN

## 2016-03-05 MED ORDER — LORAZEPAM 2 MG/ML IJ SOLN
0.5000 mg | Freq: Once | INTRAMUSCULAR | Status: AC
  Administered 2016-03-05: 0.5 mg via INTRAVENOUS
  Filled 2016-03-05: qty 1

## 2016-03-05 MED ORDER — SODIUM CHLORIDE 0.9 % IV SOLN
INTRAVENOUS | Status: AC
  Administered 2016-03-05 – 2016-03-06 (×3): via INTRAVENOUS

## 2016-03-05 MED ORDER — GUAIFENESIN-DM 100-10 MG/5ML PO SYRP
5.0000 mL | ORAL_SOLUTION | ORAL | Status: DC | PRN
  Administered 2016-03-05 – 2016-03-08 (×3): 5 mL via ORAL
  Filled 2016-03-05 (×3): qty 5

## 2016-03-05 MED ORDER — MORPHINE SULFATE (PF) 2 MG/ML IV SOLN
2.0000 mg | INTRAVENOUS | Status: DC | PRN
  Administered 2016-03-05 – 2016-03-08 (×5): 2 mg via INTRAVENOUS
  Filled 2016-03-05 (×6): qty 1

## 2016-03-05 MED ORDER — PIPERACILLIN-TAZOBACTAM 3.375 G IVPB
3.3750 g | Freq: Three times a day (TID) | INTRAVENOUS | Status: DC
  Administered 2016-03-05 – 2016-03-08 (×11): 3.375 g via INTRAVENOUS
  Filled 2016-03-05 (×11): qty 50

## 2016-03-05 MED ORDER — SODIUM CHLORIDE 0.9 % IV SOLN
30.0000 meq | Freq: Once | INTRAVENOUS | Status: AC
  Administered 2016-03-05: 30 meq via INTRAVENOUS
  Filled 2016-03-05: qty 15

## 2016-03-05 MED ORDER — ONDANSETRON HCL 4 MG PO TABS: 4.0000 mg | ORAL_TABLET | Freq: Four times a day (QID) | ORAL | Status: DC | PRN

## 2016-03-05 MED ORDER — LORAZEPAM 2 MG/ML IJ SOLN
0.5000 mg | INTRAMUSCULAR | Status: DC | PRN
  Administered 2016-03-05 – 2016-03-08 (×6): 0.5 mg via INTRAVENOUS
  Filled 2016-03-05 (×7): qty 1

## 2016-03-05 MED ORDER — SODIUM CHLORIDE 0.9 % IV SOLN
INTRAVENOUS | Status: DC
  Administered 2016-03-05: 01:00:00 via INTRAVENOUS

## 2016-03-05 MED ORDER — SODIUM CHLORIDE 0.9 % IV BOLUS (SEPSIS)
1000.0000 mL | Freq: Once | INTRAVENOUS | Status: AC
  Administered 2016-03-05: 1000 mL via INTRAVENOUS

## 2016-03-05 NOTE — Progress Notes (Addendum)
Report given to the Care link transporter and receiving nurse on 6E.  Both verbalized understanding, and voiced no further questions at this time.  Pt transfer via a stretcher with care link staff in stable condition.   Voiced in report to the nurse that the patient would need 2 more runs of potassium 10 meq.

## 2016-03-05 NOTE — H&P (Signed)
History and Physical    Raymond Raymond [name] Raymond [name] [id number] DOB: 12/11/1926 DOA: 03/04/2016  PCP: Raymond Neighbors, MD  Patient coming from: home  Chief Complaint:  Abdominal pain  HPI: Raymond Raymond [name] Raymond [name] is Raymond [name] 83 y.o. male with medical history significant of dementia, CKD, CAD comes in for the second ER visit in 24 hours for abdominal pain.  Pt was seen in ED yesterday diagnosed with GERD and d/c home after resolution of symptoms with appropriate treatment in the ED.  He had elevated lfts and bili at that time.  Pt lives with his girlfriend of over 27 years.  He started to have epigastic pain again therefore girlfriend brought him back to ED.  He does not recall anything that happened.  Denies pain.  He has pulled his IV out 4 times already and has mittons on which he is very unhappy about, he just wants to go home. He appears yellow.  Review of Systems: unreliable due to dementia  Past Medical History:  Diagnosis Date  . Anemia   . ASCVD (arteriosclerotic cardiovascular disease)    2 MI's in 1990s  . Chronic renal insufficiency, stage II (mild)    CrCl in the 60s  . Dementia   . DJD (degenerative joint disease)   . Esophageal dysphagia    Secondary to Schatzki's ring with dilation x2, most recently in 06/09, negative for H. pylori  . GERD (gastroesophageal reflux disease)   . HTN (hypertension)    BP meds stopped 2016 due to recurrent orthostatic hypotension  . Hyperlipidemia   . Hypothyroidism   . Mild cognitive impairment with memory loss   . Myocardial infarction 08-1996   Cardiac cath done but no other intervention that we know of  . Orthostatic hypotension   . Psoriasis   . Thrombocytopenia (Raymond Raymond [name] Raymond [name])    Platelets 99K on 09/2009 labs  . Ulcerative proctitis (Raymond Raymond [name] Raymond [name])    Dx: 2000, started Asacol at that time.  Was taken off asacol 2014 and has done fine since (Dr. Sydell [name], [facility])    Past Surgical History:  Procedure Laterality Date  . APPENDECTOMY  1940  . CLEFT LIP REPAIR     As Raymond [name]  child  . COLECTOMY  2004   Colonovesicular fistula secondary to diverticulitis requiring sigmoid colectomy and bladder repair  . COLONOSCOPY  06/2007   Dr. Olegario Raymond [name]: Few diverticula, normal anastomosiss/p sigmoid colectomy for diverticulitis  . colovesicular fistula  2004  . ESOPHAGEAL DILATION  06-23-07; 06/28/12   H pylori NEG.  2014-reactive gastropathy with surface erosion.  . ESOPHAGOGASTRODUODENOSCOPY  06/2007   Dr. Olegario Raymond [name]: symptomatic Schatzki ring, s/p 72mm Savary dilator, Erosive duodenitis, s/p bx   . ESOPHAGOGASTRODUODENOSCOPY (EGD) WITH ESOPHAGEAL DILATION N/Raymond [name] 06/28/2012   [id number] cervical web and distal esophageal s/p dilation/Hiatal hernia. Antral erosions (reactive gastropathy, no H.pylori)  . HERNIA REPAIR    . NECK MASS EXCISION  1979     reports that he quit smoking about 46 years ago. His smoking use included Cigarettes. He has Raymond [name] 10.00 pack-year smoking history. He has never used smokeless tobacco. He reports that he does not drink alcohol or use drugs.  No Known Allergies  Family History  Problem Relation Age of Onset  . Cancer Father     pt doesn't remember type  . Heart disease Daughter     heart defect: d age 57    Prior to Admission medications   Medication Sig Start Date End Date Taking? Authorizing Provider  acetaminophen (TYLENOL)  500 MG tablet Take 500 mg by mouth every 6 (six) hours as needed for moderate pain.   Yes Historical Provider, MD  aspirin 81 MG tablet Take 81 mg by mouth daily.     Yes Historical Provider, MD  atorvastatin (LIPITOR) 20 MG tablet TAKE ONE-HALF TABLET BY MOUTH ONCE DAILY 07/18/14  Yes Raymond Sou, MD  donepezil (ARICEPT) 10 MG tablet TAKE ONE TABLET BY MOUTH ONCE DAILY AT BEDTIME 10/18/14  Yes Raymond Sou, MD  levothyroxine (SYNTHROID, LEVOTHROID) 112 MCG tablet TAKE ONE TABLET BY MOUTH ONCE DAILY BEFORE BREAKFAST 11/22/14  Yes Renee Raymond [name] Kuneff, DO  niacin 500 MG CR capsule Take 2 capsules (1,000 mg total) by  mouth daily. 08/23/13  Yes Raymond Sou, MD  nitroGLYCERIN (NITROSTAT) 0.4 MG SL tablet Place 1 tablet (0.4 mg total) under the tongue every 5 (five) minutes as needed for chest pain. 06/26/14  Yes Raymond Sou, MD  ondansetron (ZOFRAN-ODT) 4 MG disintegrating tablet Take 1 tablet by mouth daily as needed for nausea/vomiting. 02/29/16  Yes Historical Provider, MD  pantoprazole (PROTONIX) 40 MG tablet TAKE ONE TABLET BY MOUTH ONCE DAILY BEFORE BREAKFAST 11/28/15  Yes Mahala Menghini, PA-C    Physical Exam: Vitals:   03/04/16 2200 03/04/16 2215 03/04/16 2230 03/05/16 0007  BP: 123/68  111/70 109/61  Pulse:    93  Resp: (!) 31 26 24 18   Temp:    98.4 F (36.9 C)  TempSrc:    Oral  SpO2:    96%  Weight:      Height:    5\' 11"  (1.803 m)    Constitutional: NAD, agitated because of mittons, comfortable Vitals:   03/04/16 2200 03/04/16 2215 03/04/16 2230 03/05/16 0007  BP: 123/68  111/70 109/61  Pulse:    93  Resp: (!) 31 26 24 18   Temp:    98.4 F (36.9 C)  TempSrc:    Oral  SpO2:    96%  Weight:      Height:    5\' 11"  (1.803 m)   Eyes: PERRL,  Sclera icteric mildly jaundiced overall ENMT: Mucous membranes are moist. Posterior pharynx clear of any exudate or lesions.Normal dentition.  Neck: normal, supple, no masses, no thyromegaly Respiratory: clear to auscultation bilaterally, no wheezing, no crackles. Normal respiratory effort. No accessory muscle use.  Cardiovascular: Regular rate and rhythm, no murmurs / rubs / gallops. No extremity edema. 2+ pedal pulses. No carotid bruits.  Abdomen: no tenderness, no masses palpated. No hepatosplenomegaly. Bowel sounds positive.  Musculoskeletal: no clubbing / cyanosis. No joint deformity upper and lower extremities. Good ROM, no contractures. Normal muscle tone.  Skin: no rashes, lesions, ulcers. No induration Neurologic: CN 2-12 grossly intact. Sensation intact, DTR normal. Strength 5/5 in all 4.  Psychiatric: Normal judgment and  insight. Alert and oriented x 3. Normal mood.    Labs on Admission: I have personally reviewed following labs and imaging studies  CBC:  Recent Labs Lab 03/03/16 2100 03/04/16 1937  WBC 6.0 7.6  NEUTROABS  --  6.2  HGB 11.3* 13.0  HCT 33.0* 38.1*  MCV 92.4 91.4  PLT 123* [id number]*   Basic Metabolic Panel:  Recent Labs Lab 03/03/16 2100 03/04/16 1937  NA 139 137  K 3.3* 3.5  CL 103 99*  CO2 26 25  GLUCOSE 165* 151*  BUN 14 16  CREATININE 1.11 1.00  CALCIUM 9.0 9.5   GFR: Estimated Creatinine Clearance: 49.4 mL/min (by C-G formula based on  SCr of 1 mg/dL). Liver Function Tests:  Recent Labs Lab 03/03/16 2100 03/04/16 1937  AST 200* 314*  ALT 189* 276*  ALKPHOS 240* 323*  BILITOT 2.0* 6.8*  PROT 6.9 8.1  ALBUMIN 3.6 4.0    Recent Labs Lab 03/03/16 2100 03/04/16 1937  LIPASE 21 15   Cardiac Enzymes:  Recent Labs Lab 03/03/16 2100 03/04/16 1937  TROPONINI <0.03 0.03*   Radiological Exams on Admission: Ct Abdomen Pelvis W Contrast  Result Date: 03/04/2016 CLINICAL DATA:  83 y/o  M; epigastric pain. EXAM: CT ABDOMEN AND PELVIS WITH CONTRAST TECHNIQUE: Multidetector CT imaging of the abdomen and pelvis was performed using the standard protocol following bolus administration of intravenous contrast. CONTRAST:  73mL ISOVUE-300 IOPAMIDOL (ISOVUE-300) INJECTION 61%, 169mL ISOVUE-300 IOPAMIDOL (ISOVUE-300) INJECTION 61% COMPARISON:  None. FINDINGS: Lower chest: Severe aortic valvular and coronary artery calcifications. Hepatobiliary: No focal liver lesion. Several gallstones layer dependently within the gallbladder. No gallbladder wall thickening. Moderate intra hepatic biliary ductal dilatation and enlargement of common bile duct up to 14 mm. Enhancing mass within the lower common bile duct at the pancreatic head measuring 10 x 12 mm (AP by ML series 2, image 28.) Pancreas: Unremarkable. No pancreatic ductal dilatation or surrounding inflammatory changes. Spleen:  Normal in size without focal abnormality. Adrenals/Urinary Tract: Multiple simple appearing renal cysts in the left kidney measuring up to 45 mm. Right kidney interpolar nonobstructing stone measuring 5 mm. No hydronephrosis. Normal bladder. Normal adrenal glands. Stomach/Bowel: Fluid and air-filled structures arising from the third segment of duodenum are compatible with duodenum diverticulum measuring 31 x 39 mm on the right and 31 x 24 mm on the left (series 5 image 38 and 39). No obstructive or inflammatory changes of the bowel. Prior partial colectomy with patent anastomosis in the lower mid abdomen. Sigmoid diverticulosis without evidence for diverticulitis. The appendix is not identified. Moderate volume of stool throughout the colon. Vascular/Lymphatic: Aortic atherosclerosis. Enlarged periportal and retropancreatic lymph nodes (series 2, image 22 and 23). Reproductive: Prostate calcification. Other: Small paraumbilical hernia containing fat.  No ascites. Musculoskeletal: No acute osseous abnormality. Mild multilevel degenerative changes of the spine greatest at the L4-5 level. IMPRESSION: 1. Moderate intra and extrahepatic biliary ductal dilatation with enhancing mass in the lower common bile duct measuring up to 12 mm. 2. Enlarged periportal and retropancreatic lymph nodes may be reactive or metastatic. 3. Multiple gallstones.  No secondary signs of acute cholecystitis. 4. Right kidney interpolar nonobstructing stone. 5. Two duodenum diverticulum. 6. Mild diverticulosis of residual sigmoid colon. Patent colo-colonic anastomosis. 7. Severe aortic atherosclerosis. 8. Severe aortic valvular and coronary artery calcification. Electronically Signed   By: Kristine Garbe M.D.   On: 03/04/2016 22:18   Dg Chest Portable 1 View  Result Date: 03/03/2016 CLINICAL DATA:  83 y/o M; mid lower chest pain and shortness of breath. EXAM: PORTABLE CHEST 1 VIEW COMPARISON:  12/12/2014 chest radiograph. FINDINGS:  Stable heart size and mediastinal contours are within normal limits. Aortic atherosclerosis with calcification. Stable biapical mild pleuroparenchymal scarring. Both lungs are clear. The visualized skeletal structures are unremarkable. IMPRESSION: No active disease. Electronically Signed   By: Kristine Garbe M.D.   On: 03/03/2016 21:08    EKG: Independently reviewed. Nsr with pvc cxr reviewed no edema or infiltrate Old chart reviewed Case discussed with EDP Case discussed with med RN  Assessment/Plan 83 yo male with epigastric pain with CBD dilation due to stone vs mass  Principal Problem:   Choledocholithiasis- hopefully his dilation  is due to stone and not mass.  Npo for ERCP in am.  Dr Laural Golden called and will do ERCP in am.  Ivf.  Repeat labs in am.  Cover with zosyn iv.   Active Problems:   Abnormal LFTs- due to above, ERCP in am   Hyperbilirubinemia- as above   Common bile duct dilation- as above   Acute epigastric pain- due to above   PAD (peripheral artery disease) ([place])- stable    Psoriasis- stable   Dementia with agitation - give ativan 0.5mg  iv now if not affective repeat in 30 min, give Raymond [name] liter of ivf and remove mittons until the am     DVT prophylaxis: scds  Code Status:  Presumptive full code Family Communication:  Girlfriend of over 93 years, has one kid Disposition Plan:  Per day team Consults called:  GI Admission status: admission    Raymond Raymond [name] Raymond [name],Raymond Raymond [name] A MD [facility]  If 7PM-7AM, please contact night-coverage [email or web address] Password [facility]  03/05/2016, 3:22 AM

## 2016-03-05 NOTE — Progress Notes (Signed)
Patient admitted to the hospital earlier this morning by Dr. Shanon Brow.  Patient seen and examined. He is lying in bed, pleasantly confused. Denies any chest pain or abdominal pain. Does not appear to have any complaint at this time. Abdomen is nontender on palpation.  Patient was admitted to the hospital with epigastric abdominal pain. 10 have elevated liver enzymes with bilirubin of 6.8 and transaminases in the 300 range. CT imaging indicated dilated bowel ducts with possible mass in the head of pancreas. He was started on IV fluids as well as intravenous Zosyn. Bilirubin is 4.8 today and transaminases are 180-190. He has also developed significant thrombocytopenia with a platelet count of 99. Does appear to have a chronic history of thrombocytopenia. Will use SCDs for DVT prophylaxis. He's also had a mild elevation of troponin at 0.07. He does not have any acute EKG changes does not appear to be complaining of any chest pain at present. She is maybe demand ischemia in the setting of his acute illness. We'll continue to follow. Case was discussed with gastroenterology who has seen the patient and has recommended transfer to [facility] for ERCP. Dr. Oneida Alar will discuss case with gastroenterology at [facility]. I have discussed the case with Dr. Nehemiah Settle who has accepted the patient in transfer.   Raymond [name]

## 2016-03-05 NOTE — Plan of Care (Signed)
Problem: Nutrition: Goal: Adequate nutrition will be maintained Outcome: Progressing Pt NPO

## 2016-03-05 NOTE — Progress Notes (Signed)
CRITICAL VALUE ALERT  Critical value received:  Troponin 0.07  Date of notification:  03/05/2016  Time of notification:  06:45  Critical value read back: yes  Nurse who received alert:  Adolphus Birchwood, RN  MD notified (1st page):  Dr. Shanon Brow  Time of first page:  06:51  MD notified (2nd page):  Dr. Roderic Palau  Time of second page: 07:24  Responding MD:  Awaiting response, passed report to first shift RN Tamika  Time MD responded:  07:24

## 2016-03-05 NOTE — Consult Note (Addendum)
Referring Provider: Dr. Barney Drain Primary Care Physician:  Wende Neighbors, MD Primary Gastroenterologist:  Dr. Gala Romney  Reason for Consultation:  Obstructive jaundice  HPI: Raymond [name] is a 83 y.o. 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 He presented to [facility] complaining of 24 hours of abdominal pain. Previously seen in the ER 24 hours prior to that and diagnosed with GERD and d/c home upon symptom resolution; noted elevated LFTs at that time. He started having epigastric pain radiating to his back and his girlfriend of 2 years/caregiver took him back to the ER.   He was admitted to [facility] for elevated LFTs and biliary obstruction on CT scan as below.  LFTs elevated on admission but appear improving: AST/ALT 200/189 -> 314/276 ->192/189 today; Alk phos 240 -> 323 -> 226; Bili 2.0 -> 6.8 -> 4.8 today. Had a mild bump in troponin 0.03, serial enzymes ordered: 0.03 -> 0.07.    IMPRESSION: 1. Moderate intra and extrahepatic biliary ductal dilatation with enhancing mass in the lower common bile duct measuring up to 12 mm. 2. Enlarged periportal and retropancreatic lymph nodes may be reactive or metastatic. 3. Multiple gallstones.  No secondary signs of acute cholecystitis. 4. Right kidney interpolar nonobstructing stone. 5. Two duodenum diverticulum. 6. Mild diverticulosis of residual sigmoid colon. Patent colo-colonic anastomosis. 7. Severe aortic atherosclerosis. 8. Severe aortic valvular and coronary artery calcification.  There was an initial plan for ERCP at [facility] the morning of 03/05/16; started on Zosyn to cover for possible cholangitis.   Had a bump in troponins to 0.07 this AM.  His chart was further reviewed by GI at [facility] and anesthesia and he was deemed too high risk for [facility] procedure due to  bump in troponins to 0.07, thrombocytopenia, comorbidities. After discussion with the hospitalist, GI, and anesthesia it was determined the patient would be best served by transfer to [facility] for a higher level of care due to increased ERCP risks.  Just got to his room here at [facility].  No family present yet.  Patient does not know he is in [place] at [facility] and does not know why he went to the hospital at [facility].  Denies abdominal pain.  Past Medical History:  Diagnosis Date  . Anemia   . ASCVD (arteriosclerotic cardiovascular disease)    2 MI's in 1990s  . Chronic renal insufficiency, stage II (mild)    CrCl in the 60s  . Dementia   . DJD (degenerative joint disease)   . Esophageal dysphagia    Secondary to Schatzki's ring with dilation x2, most recently in 06/09, negative for H. pylori  . GERD (gastroesophageal reflux disease)   . HTN (hypertension)    BP meds stopped 2016 due to recurrent orthostatic hypotension  . Hyperlipidemia   . Hypothyroidism   . Mild cognitive impairment with memory loss   . Myocardial infarction 08-1996   Cardiac cath done but no other intervention that we know of  . Orthostatic hypotension   . Psoriasis   . Thrombocytopenia ([place])    Platelets 99K on 09/2009 labs  . Ulcerative proctitis ([place])    Dx: 2000, started Asacol at that time.  Was taken off asacol 2014 and has done fine since (Dr. Sydell Axon, [facility])    Past Surgical History:  Procedure Laterality Date  . APPENDECTOMY  1940  .  CLEFT LIP REPAIR     As a child  . COLECTOMY  2004   Colonovesicular fistula secondary to diverticulitis requiring sigmoid colectomy and bladder repair  . COLONOSCOPY  06/2007   Dr. Olegario Messier: Few diverticula, normal anastomosiss/p sigmoid colectomy for diverticulitis  . colovesicular fistula  2004  . ESOPHAGEAL DILATION  06-23-07; 06/28/12   H pylori NEG.  2014-reactive gastropathy with surface erosion.  . ESOPHAGOGASTRODUODENOSCOPY  06/2007    Dr. Olegario Messier: symptomatic Schatzki ring, s/p 25m Savary dilator, Erosive duodenitis, s/p bx   . ESOPHAGOGASTRODUODENOSCOPY (EGD) WITH ESOPHAGEAL DILATION N/A 06/28/2012   RDTO:IZTIWPYKcervical web and distal esophageal s/p dilation/Hiatal hernia. Antral erosions (reactive gastropathy, no H.pylori)  . HERNIA REPAIR    . NECK MASS EXCISION  1979    Prior to Admission medications   Medication Sig Start Date End Date Taking? Authorizing Provider  acetaminophen (TYLENOL) 500 MG tablet Take 500 mg by mouth every 6 (six) hours as needed for moderate pain.   Yes Historical Provider, MD  aspirin 81 MG tablet Take 81 mg by mouth daily.     Yes Historical Provider, MD  atorvastatin (LIPITOR) 20 MG tablet TAKE ONE-HALF TABLET BY MOUTH ONCE DAILY 07/18/14  Yes PTammi Sou MD  donepezil (ARICEPT) 10 MG tablet TAKE ONE TABLET BY MOUTH ONCE DAILY AT BEDTIME 10/18/14  Yes PTammi Sou MD  levothyroxine (SYNTHROID, LEVOTHROID) 112 MCG tablet TAKE ONE TABLET BY MOUTH ONCE DAILY BEFORE BREAKFAST 11/22/14  Yes Renee A Kuneff, DO  niacin 500 MG CR capsule Take 2 capsules (1,000 mg total) by mouth daily. 08/23/13  Yes PTammi Sou MD  nitroGLYCERIN (NITROSTAT) 0.4 MG SL tablet Place 1 tablet (0.4 mg total) under the tongue every 5 (five) minutes as needed for chest pain. 06/26/14  Yes PTammi Sou MD  ondansetron (ZOFRAN-ODT) 4 MG disintegrating tablet Take 1 tablet by mouth daily as needed for nausea/vomiting. 02/29/16  Yes Historical Provider, MD  pantoprazole (PROTONIX) 40 MG tablet TAKE ONE TABLET BY MOUTH ONCE DAILY BEFORE BREAKFAST 11/28/15  Yes LMahala Menghini PA-C    Current Facility-Administered Medications  Medication Dose Route Frequency Provider Last Rate Last Dose  . 0.9 %  sodium chloride infusion   Intravenous Continuous JKathie Dike MD 100 mL/hr at 03/05/16 1534    . morphine 2 MG/ML injection 2 mg  2 mg Intravenous Q4H PRN RPhillips Grout MD      . ondansetron ([facility] tablet  4 mg  4 mg Oral Q6H PRN RPhillips Grout MD       Or  . ondansetron (ZOFRAN) injection 4 mg  4 mg Intravenous Q6H PRN RPhillips Grout MD      . piperacillin-tazobactam (ZOSYN) IVPB 3.375 g  3.375 g Intravenous Q8H RPhillips Grout MD   3.375 g at 03/05/16 09983 . potassium chloride 30 mEq in sodium chloride 0.9 % 265 mL (KCL MULTIRUN) IVPB  30 mEq Intravenous Once JKathie Dike MD        Allergies as of 03/04/2016  . (No Known Allergies)    Family History  Problem Relation Age of Onset  . Cancer Father     pt doesn't remember type  . Heart disease Daughter     heart defect: d age 81   Social History   Social History  . Marital status: Widowed    Spouse name: N/A  . Number of children: N/A  . Years of education: N/A   Occupational  History  . Retired from [employer]  . Smoking status: Former Smoker    Packs/day: 1.00    Years: 10.00    Types: Cigarettes    Quit date: 01/11/1970  . Smokeless tobacco: Never Used     Comment: Minimal use at 20  . Alcohol use No  . Drug use: No  . Sexual activity: Not Currently   Other Topics Concern  . Not on file   Social History Narrative   Widowed, has had girlfriend x 30 yrs, has 1 son.  Lives in [place], son lives in [place].   Occupation: [employer] in [place].  Retired 19s.   No regular exercise.  Smoked in the WAY distant past.   Alcohol: none.      Review of Systems: ROS O/W negative except as mentioned in HPI.  Physical Exam: Vital signs in last 24 hours: Temp:  [97 F (36.1 C)-98.6 F (37 C)] 98.6 F (37 C) (02/23 1521) Pulse Rate:  [69-102] 72 (02/23 1521) Resp:  [18-31] 18 (02/23 0007) BP: (109-207)/(61-107) 142/78 (02/23 1521) SpO2:  [96 %-100 %] 99 % (02/23 1521) Weight:  [137 lb 3.2 oz (62.2 kg)-148 lb (67.1 kg)] 137 lb 3.2 oz (62.2 kg) (02/23 0007)   General:   Alert, Well-developed, well-nourished, pleasant and cooperative in NAD; pleasantly demented.  Mild jaundice  noted. Head:  Normocephalic and atraumatic. Eyes:  Mild scleral icterus noted. Ears:  Normal auditory acuity. Mouth:  No deformity or lesions.   Lungs:  Clear throughout to auscultation.  No wheezes, crackles, or rhonchi.  No increased WOB. Heart:  Regular rate and rhythm; SEM noted. Abdomen:  Soft, non-distended.  BS present.  Non-tender. Rectal:  Deferred  Msk:  Symmetrical without gross deformities. Pulses:  Normal pulses noted. Extremities:  Without clubbing or edema. Neurologic:  Alert and oriented x 2;  grossly normal neurologically. Skin:  Intact without significant lesions or rashes.  Jaundice noted. Psych:  Alert and cooperative. Normal mood and affect.  Intake/Output from previous day: 02/22 0701 - 02/23 0700 In: 1156.3 [I.V.:656.3; IV Piggyback:500] Out: 200 [Urine:200]  Lab Results:  Recent Labs  03/03/16 2100 03/04/16 1937 03/05/16 0531  WBC 6.0 7.6 6.7  HGB 11.3* 13.0 10.5*  HCT 33.0* 38.1* 30.1*  PLT 123* 129* 99*   BMET  Recent Labs  03/03/16 2100 03/04/16 1937 03/05/16 0531  NA 139 137 138  K 3.3* 3.5 3.1*  CL 103 99* 104  CO2 _0 GLUCOSE 165* 151* 112*  BUN _1 CREATININE 1.11 1.00 0.97  CALCIUM 9.0 9.5 8.1*   LFT  Recent Labs  03/05/16 0531  PROT 6.1*  ALBUMIN 3.0*  AST 192*  ALT 189*  ALKPHOS 226*  BILITOT 4.8*   PT/INR  Recent Labs  03/05/16 0531  LABPROT 15.6*  INR 1.23   Studies/Results: Ct Abdomen Pelvis W Contrast  Result Date: 03/04/2016 CLINICAL DATA:  83 y/o  M; epigastric pain. EXAM: CT ABDOMEN AND PELVIS WITH CONTRAST TECHNIQUE: Multidetector CT imaging of the abdomen and pelvis was performed using the standard protocol following bolus administration of intravenous contrast. CONTRAST:  67m ISOVUE-300 IOPAMIDOL (ISOVUE-300) INJECTION 61%, 1059mISOVUE-300 IOPAMIDOL (ISOVUE-300) INJECTION 61% COMPARISON:  None. FINDINGS: Lower chest: Severe aortic valvular and coronary artery calcifications.  Hepatobiliary: No focal liver lesion. Several gallstones layer dependently within the gallbladder. No gallbladder wall thickening. Moderate intra hepatic biliary ductal dilatation and enlargement of common bile duct up to  14 mm. Enhancing mass within the lower common bile duct at the pancreatic head measuring 10 x 12 mm (AP by ML series 2, image 28.) Pancreas: Unremarkable. No pancreatic ductal dilatation or surrounding inflammatory changes. Spleen: Normal in size without focal abnormality. Adrenals/Urinary Tract: Multiple simple appearing renal cysts in the left kidney measuring up to 45 mm. Right kidney interpolar nonobstructing stone measuring 5 mm. No hydronephrosis. Normal bladder. Normal adrenal glands. Stomach/Bowel: Fluid and air-filled structures arising from the third segment of duodenum are compatible with duodenum diverticulum measuring 31 x 39 mm on the right and 31 x 24 mm on the left (series 5 image 38 and 39). No obstructive or inflammatory changes of the bowel. Prior partial colectomy with patent anastomosis in the lower mid abdomen. Sigmoid diverticulosis without evidence for diverticulitis. The appendix is not identified. Moderate volume of stool throughout the colon. Vascular/Lymphatic: Aortic atherosclerosis. Enlarged periportal and retropancreatic lymph nodes (series 2, image 22 and 23). Reproductive: Prostate calcification. Other: Small paraumbilical hernia containing fat.  No ascites. Musculoskeletal: No acute osseous abnormality. Mild multilevel degenerative changes of the spine greatest at the L4-5 level. IMPRESSION: 1. Moderate intra and extrahepatic biliary ductal dilatation with enhancing mass in the lower common bile duct measuring up to 12 mm. 2. Enlarged periportal and retropancreatic lymph nodes may be reactive or metastatic. 3. Multiple gallstones.  No secondary signs of acute cholecystitis. 4. Right kidney interpolar nonobstructing stone. 5. Two duodenum diverticulum. 6. Mild  diverticulosis of residual sigmoid colon. Patent colo-colonic anastomosis. 7. Severe aortic atherosclerosis. 8. Severe aortic valvular and coronary artery calcification. Electronically Signed   By: Kristine Garbe M.D.   On: 03/04/2016 22:18   Dg Chest Portable 1 View  Result Date: 03/03/2016 CLINICAL DATA:  83 y/o M; mid lower chest pain and shortness of breath. EXAM: PORTABLE CHEST 1 VIEW COMPARISON:  12/12/2014 chest radiograph. FINDINGS: Stable heart size and mediastinal contours are within normal limits. Aortic atherosclerosis with calcification. Stable biapical mild pleuroparenchymal scarring. Both lungs are clear. The visualized skeletal structures are unremarkable. IMPRESSION: No active disease. Electronically Signed   By: Kristine Garbe M.D.   On: 03/03/2016 21:08   IMPRESSION:  *83 year old male who presented with abdominal pain, obstructive jaundice, with CT suggesting CBD mass ? stone 12 mm and CBD dilation 14 mm. Presented with abdominal pain, does not appear to be in pain at this time and not tender on exam. LFTs elevated on admission but appear improving: AST/ALT 200/189 -> 314/276 ->192/189 today; Alk phos 240 -> 323 -> 226; Bili 2.0 -> 6.8 -> 4.8 today. Had a mild bump in troponin 0.03, serial enzymes ordered: 0.03 -> 0.07.  His chart was further reviewed by GI at [facility] and anesthesia and he was deemed too high risk for [facility] procedure due to bump in troponins to 0.07, thrombocytopenia, comorbidities. After discussion with the hospitalist, GI, and anesthesia it was determined the patient would be best served by transfer to [facility] for a higher level of care due to increased ERCP risks.  PLAN: -Continue Zosyn. -Trend LFT's. -ERCP this weekend, may or may not occur 2/24.  Will allow him a heart healthy diet and then NPO after midnight in case he can go for procedure on 2/24.  ZEHR, JESSICA D.  03/05/2016, 3:46 PM  Pager number [phone]  I have reviewed the entire  case in detail with the above APP and discussed the plan in detail.  Therefore, I agree with the diagnoses recorded above. In  addition,  I have personally interviewed and examined the patient and have personally reviewed any abdominal/pelvic CT scan images.  My additional thoughts are as follows:  The patient cannot provide helpful history.  Providers at [facility] apparently did not feel they could provide the care this patient requires.  We will happily be of service.  Fortunately, his long time partner and his son arrived while I was there.  I explained the clinical scenario and what is involved with ERCP.  Tomorrow's schedule may not allow.  If not, then the plan is for the following day.  Consent obtained after answering all questions. He is at increased risk due to age.  Patient at increased risk for cardiopulmonary complications of procedure due to medical comorbidities.  Cause of modest thrombocytopenia unclear - no signs of sepsis/DIC.  Will monitor.  It does not currently preclude ERCP or sphincterotomy if needed.  [name] Pager [phone]  Mon-Fri 8a-5p [phone] after 5p, weekends, holidays

## 2016-03-05 NOTE — Progress Notes (Signed)
MD Shanon Brow at bedside. Pt agitated, attempting to pull iv out, get out of bed, and pull off safety mittens. MD ordered 0.5 mg of Ativan and a 0.5 mg prn dose to be given if pt continues to be agitated. MD also ordered 1000 cc normal saline bolus. Orders carried out and pt resting comfortably.

## 2016-03-05 NOTE — Consult Note (Signed)
REVIEWED. Discussed with Dr. Gala Romney. Agrees pt NEEDS TRANSFER TO HIGHER LEVEL OF CARE WHERE IR AND CARDIOLOGY ARE AVAILABLE. [place]  Referring Provider: [facility] Primary Care Physician:  Wende Neighbors, MD Primary Gastroenterologist:  Dr. Gala Romney  Date of Admission: 03/04/16 Date of Consultation: 03/05/16  Reason for Consultation:  Choledocholithiasis  HPI:  Raymond [name] is a 83 y.o. male with a past medical history of dementia, CRI, dysphagia, GERD, MI, thrombocytopenia, ulcerative proctitis (not currently on treatment and asymptomatic). He presented complaining of 24 hours of abdominal pain. Previously seen in the ER 24 hours prior and diagnosed with GERD and d/c home upon symptom resolution; noted elevated LFTs at that time. He started having epigastric pain radiating to his back and his girlfriend of 63 years/caregiver brought him back to the ER.   In the ER his history was somewhat limited due to significant dementia. Noted chest pain 2 hours prior to ER, moderate and stabbing associated with abdominal pain. Denies dyspnea and N/V. He was admitted for elevated LFTs and CT demonstrating stone in the lower common bile duct measuring 12 mm and CBD dilation of 14 mm.   There was an initial plan for ERCP here at [facility] the morning of 03/05/16; started on Zosyn to cover for possible cholangitis. Today it is noted he had to have soft mittens applied due to pulling his IV out 4 times. Had significant dementia/confusion overnight and kept thinking he was at home. His girlfriend is in the room with him this morning. Subjective exam limited and somewhat unreliable given dementia. He is pleasant this morning, intermittently napping (was given Ativan last night) and everytime he wakes up he smiles and says "good morning." Denies abdominal pain, N/V. He appears mildly jaundiced although his girlfriend states she can't tell a significant difference. No other GI complaints.  Denies chest pain and dyspnea at this time.  Past Medical History:  Diagnosis Date  . Anemia   . ASCVD (arteriosclerotic cardiovascular disease)    2 MI's in 1990s  . Chronic renal insufficiency, stage II (mild)    CrCl in the 60s  . Dementia   . DJD (degenerative joint disease)   . Esophageal dysphagia    Secondary to Schatzki's ring with dilation x2, most recently in 06/09, negative for H. pylori  . GERD (gastroesophageal reflux disease)   . HTN (hypertension)    BP meds stopped 2016 due to recurrent orthostatic hypotension  . Hyperlipidemia   . Hypothyroidism   . Mild cognitive impairment with memory loss   . Myocardial infarction 08-1996   Cardiac cath done but no other intervention that we know of  . Orthostatic hypotension   . Psoriasis   . Thrombocytopenia ([place])    Platelets 99K on 09/2009 labs  . Ulcerative proctitis ([place])    Dx: 2000, started Asacol at that time.  Was taken off asacol 2014 and has done fine since (Dr. Sydell Axon, [facility])    Past Surgical History:  Procedure Laterality Date  . APPENDECTOMY  1940  . CLEFT LIP REPAIR     As a child  . COLECTOMY  2004   Colonovesicular fistula secondary to diverticulitis requiring sigmoid colectomy and bladder repair  . COLONOSCOPY  06/2007   Dr. Olegario Messier: Few diverticula, normal anastomosiss/p sigmoid colectomy for diverticulitis  . colovesicular fistula  2004  . ESOPHAGEAL DILATION  06-23-07; 06/28/12   H pylori NEG.  2014-reactive gastropathy with surface erosion.  . ESOPHAGOGASTRODUODENOSCOPY  06/2007   Dr. Olegario Messier: symptomatic Schatzki ring, s/p 43m Savary dilator, Erosive duodenitis, s/p bx   . ESOPHAGOGASTRODUODENOSCOPY (EGD) WITH ESOPHAGEAL DILATION N/A 06/28/2012   RNWG:NFAOZHYQcervical web and distal esophageal s/p dilation/Hiatal hernia. Antral erosions (reactive gastropathy, no H.pylori)  . HERNIA REPAIR    . NECK MASS EXCISION  1979    Prior to Admission medications   Medication Sig  Start Date End Date Taking? Authorizing Provider  acetaminophen (TYLENOL) 500 MG tablet Take 500 mg by mouth every 6 (six) hours as needed for moderate pain.   Yes Historical Provider, MD  aspirin 81 MG tablet Take 81 mg by mouth daily.     Yes Historical Provider, MD  atorvastatin (LIPITOR) 20 MG tablet TAKE ONE-HALF TABLET BY MOUTH ONCE DAILY 07/18/14  Yes PTammi Sou MD  donepezil (ARICEPT) 10 MG tablet TAKE ONE TABLET BY MOUTH ONCE DAILY AT BEDTIME 10/18/14  Yes PTammi Sou MD  levothyroxine (SYNTHROID, LEVOTHROID) 112 MCG tablet TAKE ONE TABLET BY MOUTH ONCE DAILY BEFORE BREAKFAST 11/22/14  Yes Renee A Kuneff, DO  niacin 500 MG CR capsule Take 2 capsules (1,000 mg total) by mouth daily. 08/23/13  Yes PTammi Sou MD  nitroGLYCERIN (NITROSTAT) 0.4 MG SL tablet Place 1 tablet (0.4 mg total) under the tongue every 5 (five) minutes as needed for chest pain. 06/26/14  Yes PTammi Sou MD  ondansetron (ZOFRAN-ODT) 4 MG disintegrating tablet Take 1 tablet by mouth daily as needed for nausea/vomiting. 02/29/16  Yes Historical Provider, MD  pantoprazole (PROTONIX) 40 MG tablet TAKE ONE TABLET BY MOUTH ONCE DAILY BEFORE BREAKFAST 11/28/15  Yes LMahala Menghini PA-C    Current Facility-Administered Medications  Medication Dose Route Frequency Provider Last Rate Last Dose  . 0.9 %  sodium chloride infusion   Intravenous Continuous RPhillips Grout MD 125 mL/hr at 03/05/16 0045    . morphine 2 MG/ML injection 2 mg  2 mg Intravenous Q4H PRN RPhillips Grout MD      . ondansetron ([facility] tablet 4 mg  4 mg Oral Q6H PRN RPhillips Grout MD       Or  . ondansetron (ZOFRAN) injection 4 mg  4 mg Intravenous Q6H PRN RPhillips Grout MD      . piperacillin-tazobactam (ZOSYN) IVPB 3.375 g  3.375 g Intravenous Q8H RPhillips Grout MD   3.375 g at 03/05/16 06578 . potassium chloride 10 mEq in 100 mL IVPB  10 mEq Intravenous Q1 Hr x 4 JKathie Dike MD        Allergies as of 03/04/2016  . (No Known  Allergies)    Family History  Problem Relation Age of Onset  . Cancer Father     pt doesn't remember type  . Heart disease Daughter     heart defect: d age 81   Social History   Social History  . Marital status: Widowed    Spouse name: N/A  . Number of children: N/A  . Years of education: N/A   Occupational History  . Retired from [employer] . Smoking status: Former Smoker    Packs/day: 1.00    Years: 10.00    Types: Cigarettes    Quit date: 01/11/1970  . Smokeless tobacco: Never Used     Comment: Minimal use at 20  . Alcohol use No  . Drug use: No  . Sexual activity: Not Currently   Other Topics Concern  .  Not on file   Social History Narrative   Widowed, has had girlfriend x 30 yrs, has 1 son.  Lives in [place], son lives in [place].   Occupation: [employer] in [place].  Retired 68s.   No regular exercise.  Smoked in the WAY distant past.   Alcohol: none.      Review of Systems: Complete ROS negative except as per HPI.  Physical Exam: Vital signs in last 24 hours: Temp:  [97 F (36.1 C)-98.4 F (36.9 C)] 97 F (36.1 C) (02/23 0700) Pulse Rate:  [88-102] 88 (02/23 0700) Resp:  [18-31] 18 (02/23 0007) BP: (109-207)/(61-107) 112/74 (02/23 0700) SpO2:  [96 %-100 %] 100 % (02/23 0700) Weight:  [137 lb 3.2 oz (62.2 kg)-148 lb (67.1 kg)] 137 lb 3.2 oz (62.2 kg) (02/23 0007)   General:   Intermittently alert but on/off napping. Well-developed, well-nourished, pleasant and cooperative in NAD Head:  Normocephalic and atraumatic. Eyes:  Minimal to mild scleral icterus Ears:  Normal auditory acuity. Neck:  Supple; no masses or thyromegaly. Lungs:  Clear throughout to auscultation.   No wheezes, crackles, or rhonchi. No acute distress. Heart:  Regular rate and rhythm; no clicks, rubs,  or gallops. Systolic blowing murmur noted 3/6. Abdomen:  Soft, nontender and nondistended. No masses, hepatosplenomegaly or hernias noted. Normal  bowel sounds, without guarding, and without rebound. Mild jaundice noted.  Rectal:  Deferred.   Msk:  Symmetrical without gross deformities. Extremities:  Without clubbing or edema. Neurologic:  Pleasantly confused.Marland Kitchen Psych:  Sleepy but cooperative. Normal mood and affect.  Intake/Output from previous day: 02/22 0701 - 02/23 0700 In: 1156.3 [I.V.:656.3; IV Piggyback:500] Out: 200 [Urine:200] Intake/Output this shift: No intake/output data recorded.  Lab Results:  Recent Labs  03/03/16 2100 03/04/16 1937 03/05/16 0531  WBC 6.0 7.6 6.7  HGB 11.3* 13.0 10.5*  HCT 33.0* 38.1* 30.1*  PLT 123* 129* 99*   BMET  Recent Labs  03/03/16 2100 03/04/16 1937 03/05/16 0531  NA 139 137 138  K 3.3* 3.5 3.1*  CL 103 99* 104  CO2 26 25 27   GLUCOSE 165* 151* 112*  BUN 14 16 14   CREATININE 1.11 1.00 0.97  CALCIUM 9.0 9.5 8.1*   LFT  Recent Labs  03/03/16 2100 03/04/16 1937 03/05/16 0531  PROT 6.9 8.1 6.1*  ALBUMIN 3.6 4.0 3.0*  AST 200* 314* 192*  ALT 189* 276* 189*  ALKPHOS 240* 323* 226*  BILITOT 2.0* 6.8* 4.8*   PT/INR  Recent Labs  03/05/16 0531  LABPROT 15.6*  INR 1.23   Hepatitis Panel No results for input(s): HEPBSAG, HCVAB, HEPAIGM, HEPBIGM in the last 72 hours. C-Diff No results for input(s): CDIFFTOX in the last 72 hours.  Studies/Results: Ct Abdomen Pelvis W Contrast  Result Date: 03/04/2016 CLINICAL DATA:  83 y/o  M; epigastric pain. EXAM: CT ABDOMEN AND PELVIS WITH CONTRAST TECHNIQUE: Multidetector CT imaging of the abdomen and pelvis was performed using the standard protocol following bolus administration of intravenous contrast. CONTRAST:  57m ISOVUE-300 IOPAMIDOL (ISOVUE-300) INJECTION 61%, 104mISOVUE-300 IOPAMIDOL (ISOVUE-300) INJECTION 61% COMPARISON:  None. FINDINGS: Lower chest: Severe aortic valvular and coronary artery calcifications. Hepatobiliary: No focal liver lesion. Several gallstones layer dependently within the gallbladder. No  gallbladder wall thickening. Moderate intra hepatic biliary ductal dilatation and enlargement of common bile duct up to 14 mm. Enhancing mass within the lower common bile duct at the pancreatic head measuring 10 x 12 mm (AP by ML series 2, image 28.) Pancreas: Unremarkable. No  pancreatic ductal dilatation or surrounding inflammatory changes. Spleen: Normal in size without focal abnormality. Adrenals/Urinary Tract: Multiple simple appearing renal cysts in the left kidney measuring up to 45 mm. Right kidney interpolar nonobstructing stone measuring 5 mm. No hydronephrosis. Normal bladder. Normal adrenal glands. Stomach/Bowel: Fluid and air-filled structures arising from the third segment of duodenum are compatible with duodenum diverticulum measuring 31 x 39 mm on the right and 31 x 24 mm on the left (series 5 image 38 and 39). No obstructive or inflammatory changes of the bowel. Prior partial colectomy with patent anastomosis in the lower mid abdomen. Sigmoid diverticulosis without evidence for diverticulitis. The appendix is not identified. Moderate volume of stool throughout the colon. Vascular/Lymphatic: Aortic atherosclerosis. Enlarged periportal and retropancreatic lymph nodes (series 2, image 22 and 23). Reproductive: Prostate calcification. Other: Small paraumbilical hernia containing fat.  No ascites. Musculoskeletal: No acute osseous abnormality. Mild multilevel degenerative changes of the spine greatest at the L4-5 level. IMPRESSION: 1. Moderate intra and extrahepatic biliary ductal dilatation with enhancing mass in the lower common bile duct measuring up to 12 mm. 2. Enlarged periportal and retropancreatic lymph nodes may be reactive or metastatic. 3. Multiple gallstones.  No secondary signs of acute cholecystitis. 4. Right kidney interpolar nonobstructing stone. 5. Two duodenum diverticulum. 6. Mild diverticulosis of residual sigmoid colon. Patent colo-colonic anastomosis. 7. Severe aortic  atherosclerosis. 8. Severe aortic valvular and coronary artery calcification. Electronically Signed   By: Kristine Garbe M.D.   On: 03/04/2016 22:18   Dg Chest Portable 1 View  Result Date: 03/03/2016 CLINICAL DATA:  83 y/o M; mid lower chest pain and shortness of breath. EXAM: PORTABLE CHEST 1 VIEW COMPARISON:  12/12/2014 chest radiograph. FINDINGS: Stable heart size and mediastinal contours are within normal limits. Aortic atherosclerosis with calcification. Stable biapical mild pleuroparenchymal scarring. Both lungs are clear. The visualized skeletal structures are unremarkable. IMPRESSION: No active disease. Electronically Signed   By: Kristine Garbe M.D.   On: 03/03/2016 21:08    Impression: 83 year old male with intermittent abdominal pain with CT suggesting CBD stone 12 mm and CBD dilation 14 mm. Presented with abdominal pain, does not appear to be in pain at this time, although question reliability of subjective exam due to significant dementia/confusion. LFTs elevated on admission but appear improving: AST/ALT 200/189 -> 314/276 ->192/189 today; Alk phos 240 -> 323 -> 226; Bili 2.0 -> 6.8 -> 4.8 today. Has had a mild bump in troponin 0.03, serial enzymes ordered: 0.03 -> 0.07. INR normal. Hgb depressed but within baseline of the past year, 10.5 today. Platelets stable but low at 99.  His chart was further reviewed by GI and anesthesia and he was deemed too high risk for [facility] procedure due to bump in troponins to 0.07, thrombocytopenia, comorbidities. After discussion with the hospitalist, GI, and anesthesia it was determined the patient would be best served by transfer to [facility] for a higher level of care due to increased ERCP risks.  I discussed the situation with the patient's girlfriend/caregiver, provided written information. She verbalized understanding   Plan: 1. Transfer to [facility] for GI consult and possible ERCP 2. Continue antibiotics 3. Continue serial  troponins   Thank you for allowing [place] to participate in the care of Liliane Shi, DNP, AGNP-C Adult & Gerontological Nurse Practitioner [facility]    LOS: 1 day     03/05/2016, 9:49 AM

## 2016-03-05 NOTE — Progress Notes (Signed)
Pharmacy Antibiotic Note  Raymond [name] is a 83 y.o. male admitted on 03/04/2016 with intra abdominal infection.  Pharmacy has been consulted for ZOSYN dosing.  Plan: Zosyn 3.375gm IV q8h, EID Monitor labs, progress, c/s  Height: 5\' 11"  (180.3 cm) Weight: 137 lb 3.2 oz (62.2 kg) IBW/kg (Calculated) : 75.3  Temp (24hrs), Avg:97.7 F (36.5 C), Min:97 F (36.1 C), Max:98.4 F (36.9 C)   Recent Labs Lab 03/03/16 2100 03/04/16 1937 03/05/16 0531  WBC 6.0 7.6 6.7  CREATININE 1.11 1.00 0.97    Estimated Creatinine Clearance: 47.2 mL/min (by C-G formula based on SCr of 0.97 mg/dL).    No Known Allergies  Antimicrobials this admission: Zosyn 2/23 >>   Dose adjustments this admission:  Microbiology results:  BCx: pending  UCx: pending   Sputum:    MRSA PCR:   Thank you for allowing pharmacy to be a part of this patient's care.  Hart Robinsons A 03/05/2016 11:00 AM

## 2016-03-05 NOTE — Progress Notes (Signed)
Admission note:  Arrival Method: via stretcher Mental Orientation: A&Ox2 Telemetry: initiated, CCMD notified Assessment: completed Skin: stage 1 on sacrum, foam dressing intact, no open areas, jaundice noted. Iv: NS @ 164ml/hr infusing at R Fa, site clean and dry. Pain: denies Tubes: none Safety Measures and fall prevention plan discussed and reviewed with pt, verbalized understanding. Admission Screening: completed 6700 Orientation: Patient has been oriented to the unit, staff and to the room. Call bell and telephone within reach, will continue to monitor.

## 2016-03-06 LAB — COMPREHENSIVE METABOLIC PANEL
ALT: 152 U/L — AB (ref 17–63)
ANION GAP: 10 (ref 5–15)
AST: 111 U/L — ABNORMAL HIGH (ref 15–41)
Albumin: 3 g/dL — ABNORMAL LOW (ref 3.5–5.0)
Alkaline Phosphatase: 211 U/L — ABNORMAL HIGH (ref 38–126)
BUN: 11 mg/dL (ref 6–20)
CHLORIDE: 105 mmol/L (ref 101–111)
CO2: 26 mmol/L (ref 22–32)
CREATININE: 0.98 mg/dL (ref 0.61–1.24)
Calcium: 9 mg/dL (ref 8.9–10.3)
Glucose, Bld: 83 mg/dL (ref 65–99)
Potassium: 3.9 mmol/L (ref 3.5–5.1)
Sodium: 141 mmol/L (ref 135–145)
Total Bilirubin: 2.5 mg/dL — ABNORMAL HIGH (ref 0.3–1.2)
Total Protein: 6.4 g/dL — ABNORMAL LOW (ref 6.5–8.1)

## 2016-03-06 LAB — CBC
HCT: 35.2 % — ABNORMAL LOW (ref 39.0–52.0)
HEMOGLOBIN: 11.5 g/dL — AB (ref 13.0–17.0)
MCH: 30.1 pg (ref 26.0–34.0)
MCHC: 32.7 g/dL (ref 30.0–36.0)
MCV: 92.1 fL (ref 78.0–100.0)
PLATELETS: 122 10*3/uL — AB (ref 150–400)
RBC: 3.82 MIL/uL — AB (ref 4.22–5.81)
RDW: 13.6 % (ref 11.5–15.5)
WBC: 5.4 10*3/uL (ref 4.0–10.5)

## 2016-03-06 MED ORDER — DEXTROSE-NACL 5-0.9 % IV SOLN
INTRAVENOUS | Status: DC
  Administered 2016-03-06 – 2016-03-07 (×4): via INTRAVENOUS

## 2016-03-06 MED ORDER — ATENOLOL 50 MG PO TABS
50.0000 mg | ORAL_TABLET | Freq: Every day | ORAL | Status: DC
  Administered 2016-03-06 – 2016-03-09 (×3): 50 mg via ORAL
  Filled 2016-03-06 (×3): qty 1

## 2016-03-06 NOTE — Progress Notes (Signed)
MD notified of current BP of 178/101 which is rechecked from 177/101. This RN informed MD of hypertensive medications discontinued in 2016 due to orthostatic hypotension per chart review.  This RN called patient's pharmacy ([facility], [place], [place]) and received list of hypertensive medications. MD ordered to restart Atenolol 50mg  qday. Will monitor. Rosalita Chessman, RN

## 2016-03-06 NOTE — Progress Notes (Signed)
PROGRESS NOTE  Raymond [name]  [id number] DOB: March 01, 1926  DOA: 03/04/2016 PCP: Wende Neighbors, MD   Brief Narrative:  Raymond [name] is a 83 y.o. male with multiple complicated medical history including but not limited to dementia, chronic renal insufficiency, dysphagia, GERD, CAD status post MI, thrombocytopenia, ulcerative proctitis who presented to [facility] with epigastric abdominal pain and nausea associated with elevated LFT's and 12 mm CBD stone with 14 mm CBD dilatation. He also had elevated troponins without acute EKG changes or chest pain and is felt to be demand ischemia related due to his acute illness.  He was transferred to [facility] on 03/05/2016 for further management of choledocholithiasis, possible need for ERCP.    Assessment & Plan:   Principal Problem:   Choledocholithiasis Active Problems:   PAD (peripheral artery disease) (HCC)   Psoriasis   Dementia   Abnormal LFTs   Hyperbilirubinemia   Common bile duct dilation   Acute epigastric pain   Calculus of bile duct without cholecystitis with obstruction   Hypokalemia   Elevated troponin   Thrombocytopenia (HCC)   Pressure injury of skin   Jaundice  #1 Choledocholithiasis: Supportive care Antibiotics GI follow-up for possible ERCP LFTs improving some  #2 Elevated Troponin: No obvious evidence of acute changes on EKG No chest pains Likely demand ischemia related to acute illness Follow clinically Consider cardiology consult as needed  #3 Hypertension: Patient with history of hypertension Had been on atenolol but was discontinued due to orthostasis Resume beta blocker and monitor  #4 Thrombocytopenia: No new Follow clinically  DVT prophylaxis: SCD Code Status: Full code Family Communication:  Disposition Plan: possible snf   Consultants:  GI Procedures:     Antimicrobials:  Zosyn   Subjective: Patient was seen in her house chart review medication. Admission H&P  reviewed and noted. No acute events noted overnight denies chest pain or shortness of breath. Abdominal pain better  Objective:  Vitals:   03/05/16 1729 03/05/16 2026 03/06/16 0543 03/06/16 1017  BP: (!) 152/79 (!) 156/89 (!) 170/90 (!) 177/101  Pulse: 79 73 83 90  Resp: 17 18 16 18   Temp: 98.1 F (36.7 C) 97.9 F (36.6 C) 97.6 F (36.4 C) 97.6 F (36.4 C)  TempSrc: Oral Oral Oral Oral  SpO2: 98% 93% 92% 99%  Weight:   68.1 kg (150 lb 1.6 oz)   Height:        Intake/Output Summary (Last 24 hours) at 03/06/16 1204 Last data filed at 03/06/16 1017  Gross per 24 hour  Intake             1550 ml  Output             1850 ml  Net             -300 ml   Filed Weights   03/04/16 1841 03/05/16 0007 03/06/16 0543  Weight: 67.1 kg (148 lb) 62.2 kg (137 lb 3.2 oz) 68.1 kg (150 lb 1.6 oz)    Examination:  General exam: No acute distress HEENT: Mild icterus with pallor Respiratory system: Clear to auscultation. Respiratory effort normal. Cardiovascular system: S1 & S2 heard, RRR. No JVD, murmurs, rubs, gallops or clicks. No pedal edema. Gastrointestinal system: Abdomen is nondistended, soft,  minimal epigastric tenderness without rebound tenderness. No organomegaly or masses felt. Normal bowel sounds heard. Central nervous system: Alert and responsive. No focal neurological deficits. Extremities: Symmetric 5 x 5 power. Skin: No rashes, lesions or ulcers  Psychiatry: Judgement and insight appear normal. Mood & affect appropriate.     Data Reviewed: I have personally reviewed following labs and imaging studies  CBC:  Recent Labs Lab 03/03/16 2100 03/04/16 1937 03/05/16 0531 03/06/16 0510  WBC 6.0 7.6 6.7 5.4  NEUTROABS  --  6.2  --   --   HGB 11.3* 13.0 10.5* 11.5*  HCT 33.0* 38.1* 30.1* 35.2*  MCV 92.4 91.4 92.0 92.1  PLT 123* 129* 99* 123XX123*   Basic Metabolic Panel:  Recent Labs Lab 03/03/16 2100 03/04/16 1937 03/05/16 0531 03/06/16 0510  NA 139 137 138 141  K  3.3* 3.5 3.1* 3.9  CL 103 99* 104 105  CO2 26 25 27 26   GLUCOSE 165* 151* 112* 83  BUN 14 16 14 11   CREATININE 1.11 1.00 0.97 0.98  CALCIUM 9.0 9.5 8.1* 9.0   GFR: Estimated Creatinine Clearance: 51.2 mL/min (by C-G formula based on SCr of 0.98 mg/dL). Liver Function Tests:  Recent Labs Lab 03/03/16 2100 03/04/16 1937 03/05/16 0531 03/06/16 0510  AST 200* 314* 192* 111*  ALT 189* 276* 189* 152*  ALKPHOS 240* 323* 226* 211*  BILITOT 2.0* 6.8* 4.8* 2.5*  PROT 6.9 8.1 6.1* 6.4*  ALBUMIN 3.6 4.0 3.0* 3.0*    Recent Labs Lab 03/03/16 2100 03/04/16 1937  LIPASE 21 15   No results for input(s): AMMONIA in the last 168 hours. Coagulation Profile:  Recent Labs Lab 03/05/16 0531  INR 1.23   Cardiac Enzymes:  Recent Labs Lab 03/03/16 2100 03/04/16 1937 03/05/16 0531  TROPONINI <0.03 0.03* 0.07*   BNP (last 3 results) No results for input(s): PROBNP in the last 8760 hours. HbA1C: No results for input(s): HGBA1C in the last 72 hours. CBG: No results for input(s): GLUCAP in the last 168 hours. Lipid Profile: No results for input(s): CHOL, HDL, LDLCALC, TRIG, CHOLHDL, LDLDIRECT in the last 72 hours. Thyroid Function Tests: No results for input(s): TSH, T4TOTAL, FREET4, T3FREE, THYROIDAB in the last 72 hours. Anemia Panel: No results for input(s): VITAMINB12, FOLATE, FERRITIN, TIBC, IRON, RETICCTPCT in the last 72 hours.  Sepsis Labs:  Recent Labs Lab 03/03/16 2100 03/04/16 1937 03/05/16 0531 03/06/16 0510  WBC 6.0 7.6 6.7 5.4    No results found for this or any previous visit (from the past 240 hour(s)).       Radiology Studies: Ct Abdomen Pelvis W Contrast  Result Date: 03/04/2016 CLINICAL DATA:  83 y/o  M; epigastric pain. EXAM: CT ABDOMEN AND PELVIS WITH CONTRAST TECHNIQUE: Multidetector CT imaging of the abdomen and pelvis was performed using the standard protocol following bolus administration of intravenous contrast. CONTRAST:  63mL ISOVUE-300  IOPAMIDOL (ISOVUE-300) INJECTION 61%, 129mL ISOVUE-300 IOPAMIDOL (ISOVUE-300) INJECTION 61% COMPARISON:  None. FINDINGS: Lower chest: Severe aortic valvular and coronary artery calcifications. Hepatobiliary: No focal liver lesion. Several gallstones layer dependently within the gallbladder. No gallbladder wall thickening. Moderate intra hepatic biliary ductal dilatation and enlargement of common bile duct up to 14 mm. Enhancing mass within the lower common bile duct at the pancreatic head measuring 10 x 12 mm (AP by ML series 2, image 28.) Pancreas: Unremarkable. No pancreatic ductal dilatation or surrounding inflammatory changes. Spleen: Normal in size without focal abnormality. Adrenals/Urinary Tract: Multiple simple appearing renal cysts in the left kidney measuring up to 45 mm. Right kidney interpolar nonobstructing stone measuring 5 mm. No hydronephrosis. Normal bladder. Normal adrenal glands. Stomach/Bowel: Fluid and air-filled structures arising from the third segment of duodenum are compatible with duodenum  diverticulum measuring 31 x 39 mm on the right and 31 x 24 mm on the left (series 5 image 38 and 39). No obstructive or inflammatory changes of the bowel. Prior partial colectomy with patent anastomosis in the lower mid abdomen. Sigmoid diverticulosis without evidence for diverticulitis. The appendix is not identified. Moderate volume of stool throughout the colon. Vascular/Lymphatic: Aortic atherosclerosis. Enlarged periportal and retropancreatic lymph nodes (series 2, image 22 and 23). Reproductive: Prostate calcification. Other: Small paraumbilical hernia containing fat.  No ascites. Musculoskeletal: No acute osseous abnormality. Mild multilevel degenerative changes of the spine greatest at the L4-5 level. IMPRESSION: 1. Moderate intra and extrahepatic biliary ductal dilatation with enhancing mass in the lower common bile duct measuring up to 12 mm. 2. Enlarged periportal and retropancreatic lymph  nodes may be reactive or metastatic. 3. Multiple gallstones.  No secondary signs of acute cholecystitis. 4. Right kidney interpolar nonobstructing stone. 5. Two duodenum diverticulum. 6. Mild diverticulosis of residual sigmoid colon. Patent colo-colonic anastomosis. 7. Severe aortic atherosclerosis. 8. Severe aortic valvular and coronary artery calcification. Electronically Signed   By: Kristine Garbe M.D.   On: 03/04/2016 22:18        Scheduled Meds: . atenolol  50 mg Oral Daily  . piperacillin-tazobactam (ZOSYN)  IV  3.375 g Intravenous Q8H   Continuous Infusions:   LOS: 2 days    Time spent: 53 min    OSEI-BONSU,Ronniesha Seibold, MD [facility] Pager [phone]  If 7PM-7AM, please contact night-coverage [email or web address] Password [facility] 03/06/2016, 12:04 PM

## 2016-03-07 ENCOUNTER — Encounter (HOSPITAL_COMMUNITY)
Admission: EM | Disposition: A | Discharge status: Skilled Nursing Facility | Payer: Self-pay | Source: Home / Self Care | Attending: Internal Medicine

## 2016-03-07 ENCOUNTER — Inpatient Hospital Stay (HOSPITAL_COMMUNITY): Payer: Medicare Other | Admitting: Certified Registered Nurse Anesthetist

## 2016-03-07 ENCOUNTER — Inpatient Hospital Stay (HOSPITAL_COMMUNITY): Payer: Medicare Other

## 2016-03-07 ENCOUNTER — Encounter (HOSPITAL_COMMUNITY): Payer: Self-pay | Admitting: Anesthesiology

## 2016-03-07 DIAGNOSIS — K222 Esophageal obstruction: Secondary | ICD-10-CM

## 2016-03-07 HISTORY — PX: ERCP: SHX5425

## 2016-03-07 LAB — COMPREHENSIVE METABOLIC PANEL
ALK PHOS: 186 U/L — AB (ref 38–126)
ALT: 112 U/L — AB (ref 17–63)
AST: 62 U/L — ABNORMAL HIGH (ref 15–41)
Albumin: 2.8 g/dL — ABNORMAL LOW (ref 3.5–5.0)
Anion gap: 10 (ref 5–15)
BUN: 6 mg/dL (ref 6–20)
CALCIUM: 8.6 mg/dL — AB (ref 8.9–10.3)
CHLORIDE: 104 mmol/L (ref 101–111)
CO2: 25 mmol/L (ref 22–32)
CREATININE: 0.94 mg/dL (ref 0.61–1.24)
Glucose, Bld: 128 mg/dL — ABNORMAL HIGH (ref 65–99)
Potassium: 3.2 mmol/L — ABNORMAL LOW (ref 3.5–5.1)
SODIUM: 139 mmol/L (ref 135–145)
Total Bilirubin: 1.7 mg/dL — ABNORMAL HIGH (ref 0.3–1.2)
Total Protein: 6.2 g/dL — ABNORMAL LOW (ref 6.5–8.1)

## 2016-03-07 SURGERY — ERCP, WITH INTERVENTION IF INDICATED
Anesthesia: General

## 2016-03-07 MED ORDER — FENTANYL CITRATE (PF) 100 MCG/2ML IJ SOLN
INTRAMUSCULAR | Status: DC | PRN
  Administered 2016-03-07: 100 ug via INTRAVENOUS

## 2016-03-07 MED ORDER — HYDROMORPHONE HCL 1 MG/ML IJ SOLN: 0.2500 mg | INTRAMUSCULAR | Status: DC | PRN

## 2016-03-07 MED ORDER — ARTIFICIAL TEARS OP OINT
TOPICAL_OINTMENT | OPHTHALMIC | Status: DC | PRN
  Administered 2016-03-07: 1 via OPHTHALMIC

## 2016-03-07 MED ORDER — LACTATED RINGERS IV SOLN: INTRAVENOUS | Status: DC | PRN

## 2016-03-07 MED ORDER — SUGAMMADEX SODIUM 200 MG/2ML IV SOLN
INTRAVENOUS | Status: DC | PRN
  Administered 2016-03-07: 200 mg via INTRAVENOUS

## 2016-03-07 MED ORDER — PHENYLEPHRINE HCL 10 MG/ML IJ SOLN
INTRAVENOUS | Status: DC | PRN
  Administered 2016-03-07: 50 ug/min via INTRAVENOUS

## 2016-03-07 MED ORDER — EPHEDRINE SULFATE 50 MG/ML IJ SOLN
INTRAMUSCULAR | Status: DC | PRN
  Administered 2016-03-07: 15 mg via INTRAVENOUS

## 2016-03-07 MED ORDER — INDOMETHACIN 50 MG RE SUPP: 100.0000 mg | Freq: Once | RECTAL | Status: DC

## 2016-03-07 MED ORDER — SODIUM CHLORIDE 0.9 % IV SOLN
INTRAVENOUS | Status: DC | PRN
  Administered 2016-03-07: 50 mL

## 2016-03-07 MED ORDER — LIDOCAINE HCL (CARDIAC) 20 MG/ML IV SOLN
INTRAVENOUS | Status: DC | PRN
  Administered 2016-03-07: 100 mg via INTRAVENOUS

## 2016-03-07 MED ORDER — IOPAMIDOL (ISOVUE-300) INJECTION 61%
INTRAVENOUS | Status: AC
  Filled 2016-03-07: qty 50

## 2016-03-07 MED ORDER — MEPERIDINE HCL 100 MG/ML IJ SOLN: 6.2500 mg | INTRAMUSCULAR | Status: DC | PRN

## 2016-03-07 MED ORDER — SODIUM CHLORIDE 0.9 % IV SOLN: INTRAVENOUS | Status: DC

## 2016-03-07 MED ORDER — INDOMETHACIN 50 MG RE SUPP
RECTAL | Status: DC | PRN
  Administered 2016-03-07 (×2): 50 mg via RECTAL

## 2016-03-07 MED ORDER — INDOMETHACIN 50 MG RE SUPP
RECTAL | Status: AC
  Filled 2016-03-07: qty 2

## 2016-03-07 MED ORDER — PHENYLEPHRINE HCL 10 MG/ML IJ SOLN
INTRAMUSCULAR | Status: DC | PRN
  Administered 2016-03-07: 80 ug via INTRAVENOUS

## 2016-03-07 MED ORDER — ROCURONIUM BROMIDE 100 MG/10ML IV SOLN
INTRAVENOUS | Status: DC | PRN
  Administered 2016-03-07: 50 mg via INTRAVENOUS

## 2016-03-07 MED ORDER — ONDANSETRON HCL 4 MG/2ML IJ SOLN: 4.0000 mg | Freq: Once | INTRAMUSCULAR | Status: DC | PRN

## 2016-03-07 MED ORDER — SODIUM CHLORIDE 0.9 % IV SOLN
INTRAVENOUS | Status: DC | PRN
  Administered 2016-03-07: 08:00:00 via INTRAVENOUS

## 2016-03-07 MED ORDER — ONDANSETRON HCL 4 MG/2ML IJ SOLN
INTRAMUSCULAR | Status: DC | PRN
Start: 2016-03-07 — End: 2016-03-07
  Administered 2016-03-07: 4 mg via INTRAVENOUS

## 2016-03-07 MED ORDER — GLUCAGON HCL RDNA (DIAGNOSTIC) 1 MG IJ SOLR
INTRAMUSCULAR | Status: AC
  Filled 2016-03-07: qty 1

## 2016-03-07 MED ORDER — POTASSIUM CHLORIDE CRYS ER 20 MEQ PO TBCR
20.0000 meq | EXTENDED_RELEASE_TABLET | Freq: Two times a day (BID) | ORAL | Status: DC
  Administered 2016-03-07 – 2016-03-09 (×5): 20 meq via ORAL
  Filled 2016-03-07 (×5): qty 1

## 2016-03-07 NOTE — Anesthesia Preprocedure Evaluation (Addendum)
Anesthesia Evaluation  Patient identified by MRN, date of birth, ID band Patient confused    Reviewed: Allergy & Precautions, H&P , NPO status , Patient's Chart, lab work & pertinent test results  Airway Mallampati: I  TM Distance: >3 FB Neck ROM: Full    Dental  (+) Dental Advidsory Given   Pulmonary former smoker,    Pulmonary exam normal        Cardiovascular hypertension, On Medications + Past MI  Normal cardiovascular exam     Neuro/Psych Dementia with agitation   GI/Hepatic PUD, GERD  Medicated and Controlled,  Endo/Other  Hypothyroidism   Renal/GU      Musculoskeletal   Abdominal   Peds  Hematology  (+) anemia ,   Anesthesia Other Findings Patient poor historian.  Information received from his children.  Reproductive/Obstetrics                           Anesthesia Physical Anesthesia Plan  ASA: III  Anesthesia Plan: General   Post-op Pain Management:    Induction: Intravenous  Airway Management Planned: Oral ETT  Additional Equipment:   Intra-op Plan:   Post-operative Plan: Extubation in OR  Informed Consent: I have reviewed the patients History and Physical, chart, labs and discussed the procedure including the risks, benefits and alternatives for the proposed anesthesia with the patient or authorized representative who has indicated his/her understanding and acceptance.   Dental Advisory Given  Plan Discussed with: CRNA and Surgeon  Anesthesia Plan Comments:        Anesthesia Quick Evaluation

## 2016-03-07 NOTE — Interval H&P Note (Signed)
History and Physical Interval Note:  03/07/2016 8:11 AM  Raymond [name]  has presented today for surgery, with the diagnosis of Obstructive jaundice; CBD mass and biliary dilation  The various methods of treatment have been discussed with the patient and family. After consideration of risks, benefits and other options for treatment, the patient has consented to  Procedure(s): ENDOSCOPIC RETROGRADE CHOLANGIOPANCREATOGRAPHY (ERCP) (N/A) as a surgical intervention .  The patient's history has been reviewed, patient examined, no change in status, stable for surgery.  I have reviewed the patient's chart and labs.  Questions were answered to the patient's satisfaction.     [name]

## 2016-03-07 NOTE — Anesthesia Procedure Notes (Signed)
Procedure Name: Intubation Date/Time: 03/07/2016 8:20 AM Performed by: Neldon Newport Pre-anesthesia Checklist: Timeout performed, Patient being monitored, Suction available, Emergency Drugs available and Patient identified Patient Re-evaluated:Patient Re-evaluated prior to inductionOxygen Delivery Method: Circle system utilized Preoxygenation: Pre-oxygenation with 100% oxygen Intubation Type: IV induction Ventilation: Mask ventilation without difficulty Laryngoscope Size: Mac and 3 Grade View: Grade I Tube type: Oral Tube size: 7.5 mm Number of attempts: 1 Placement Confirmation: breath sounds checked- equal and bilateral,  positive ETCO2 and ETT inserted through vocal cords under direct vision Secured at: 23 cm Tube secured with: Tape Dental Injury: Teeth and Oropharynx as per pre-operative assessment

## 2016-03-07 NOTE — Progress Notes (Signed)
PROGRESS NOTE  Raymond [name]  [id number] DOB: 03-Jun-1926  DOA: 03/04/2016 PCP: Wende Neighbors, MD   Brief Narrative:  Raymond [name] is a 83 y.o. male with multiple complicated medical history including but not limited to dementia, chronic renal insufficiency, dysphagia, GERD, CAD status post MI, thrombocytopenia, ulcerative proctitis who presented to [facility] with epigastric abdominal pain and nausea associated with elevated LFT's and 12 mm CBD stone with 14 mm CBD dilatation. He also had elevated troponins without acute EKG changes or chest pain and is felt to be demand ischemia related due to his acute illness.  He was transferred to [facility] on 03/05/2016 for further management of choledocholithiasis, possible need for ERCP.    Assessment & Plan:   Principal Problem:   Choledocholithiasis Active Problems:   PAD (peripheral artery disease) (HCC)   Psoriasis   Dementia   Abnormal LFTs   Hyperbilirubinemia   Common bile duct dilation   Acute epigastric pain   Calculus of bile duct without cholecystitis with obstruction   Hypokalemia   Elevated troponin   Thrombocytopenia (HCC)   Pressure injury of skin   Jaundice  #1 Choledocholithiasis: Supportive care Antibiotics ERCP for today- f/u results LFTs improving some  #2 Hypokalemia: Repeat potassium and follow renal function/electrolytes  #3 Elevated Troponin: No obvious evidence of acute changes on EKG No chest pains Likely demand ischemia related to acute illness Follow clinically Consider cardiology consult as needed  #4 Hypertension: Patient with history of hypertension Had been on atenolol but was discontinued due to orthostasis Resume beta blocker and monitor  #4 Thrombocytopenia: No new Follow clinically  DVT prophylaxis: SCD Code Status: Full code Family Communication:  Disposition Plan: possible snf   Consultants:  GI Procedures:  ERCP 03/07/2016 Antimicrobials:   Zosyn   Subjective: Patient was seen in her house chart review medication. GI notes reviewed and noted. No acute events noted overnight denies chest pain or shortness of breath. Abdominal pain better  Objective:  Vitals:   03/07/16 1040 03/07/16 1045 03/07/16 1050 03/07/16 1055  BP: (!) 151/76   (!) 153/76  Pulse: 67  (!) 58 (!) 59  Resp: 15  16 18   Temp:      TempSrc:      SpO2: 98% 95%    Weight:      Height:        Intake/Output Summary (Last 24 hours) at 03/07/16 1240 Last data filed at 03/07/16 1105  Gross per 24 hour  Intake          2166.67 ml  Output             2401 ml  Net          -234.33 ml   Filed Weights   03/05/16 0007 03/06/16 0543 03/06/16 2229  Weight: 62.2 kg (137 lb 3.2 oz) 68.1 kg (150 lb 1.6 oz) 64.9 kg (143 lb)    Examination:  General exam: No acute distress HEENT: Mild icterus with pallor Respiratory system: Clear to auscultation. Respiratory effort normal. Cardiovascular system: S1 & S2 heard, RRR. No JVD, murmurs, rubs, gallops or clicks. No pedal edema. Gastrointestinal system: Abdomen is nondistended, soft, Nontender, bowel sounds are normoactive . Central nervous system: Alert and responsive. No focal neurological deficits. Extremities: Symmetric 5 x 5 power. Skin: No rashes, lesions or ulcers Psychiatry: Judgement and insight appear normal. Mood & affect appropriate.     Data Reviewed: I have personally reviewed following labs and imaging studies  CBC:  Recent Labs Lab 03/03/16 2100 03/04/16 1937 03/05/16 0531 03/06/16 0510  WBC 6.0 7.6 6.7 5.4  NEUTROABS  --  6.2  --   --   HGB 11.3* 13.0 10.5* 11.5*  HCT 33.0* 38.1* 30.1* 35.2*  MCV 92.4 91.4 92.0 92.1  PLT 123* 129* 99* 123XX123*   Basic Metabolic Panel:  Recent Labs Lab 03/03/16 2100 03/04/16 1937 03/05/16 0531 03/06/16 0510 03/07/16 0724  NA 139 137 138 141 139  K 3.3* 3.5 3.1* 3.9 3.2*  CL 103 99* 104 105 104  CO2 26 25 27 26 25   GLUCOSE 165* 151* 112*  83 128*  BUN 14 16 14 11 6   CREATININE 1.11 1.00 0.97 0.98 0.94  CALCIUM 9.0 9.5 8.1* 9.0 8.6*   GFR: Estimated Creatinine Clearance: 50.8 mL/min (by C-G formula based on SCr of 0.94 mg/dL). Liver Function Tests:  Recent Labs Lab 03/03/16 2100 03/04/16 1937 03/05/16 0531 03/06/16 0510 03/07/16 0724  AST 200* 314* 192* 111* 62*  ALT 189* 276* 189* 152* 112*  ALKPHOS 240* 323* 226* 211* 186*  BILITOT 2.0* 6.8* 4.8* 2.5* 1.7*  PROT 6.9 8.1 6.1* 6.4* 6.2*  ALBUMIN 3.6 4.0 3.0* 3.0* 2.8*    Recent Labs Lab 03/03/16 2100 03/04/16 1937  LIPASE 21 15   No results for input(s): AMMONIA in the last 168 hours. Coagulation Profile:  Recent Labs Lab 03/05/16 0531  INR 1.23   Cardiac Enzymes:  Recent Labs Lab 03/03/16 2100 03/04/16 1937 03/05/16 0531  TROPONINI <0.03 0.03* 0.07*   BNP (last 3 results) No results for input(s): PROBNP in the last 8760 hours. HbA1C: No results for input(s): HGBA1C in the last 72 hours. CBG: No results for input(s): GLUCAP in the last 168 hours. Lipid Profile: No results for input(s): CHOL, HDL, LDLCALC, TRIG, CHOLHDL, LDLDIRECT in the last 72 hours. Thyroid Function Tests: No results for input(s): TSH, T4TOTAL, FREET4, T3FREE, THYROIDAB in the last 72 hours. Anemia Panel: No results for input(s): VITAMINB12, FOLATE, FERRITIN, TIBC, IRON, RETICCTPCT in the last 72 hours.  Sepsis Labs:  Recent Labs Lab 03/03/16 2100 03/04/16 1937 03/05/16 0531 03/06/16 0510  WBC 6.0 7.6 6.7 5.4    No results found for this or any previous visit (from the past 240 hour(s)).       Radiology Studies: Dg Ercp Biliary & Pancreatic Ducts  Result Date: 03/07/2016 CLINICAL DATA:  Obstructive jaundice, suspect choledocholithiasis EXAM: ERCP stone removal with balloon extraction.  Biliary sphincterotomy. TECHNIQUE: Multiple spot images obtained with the fluoroscopic device and submitted for interpretation post-procedure. FLUOROSCOPY TIME:   Fluoroscopy Time:  8 minutes 51 seconds COMPARISON:  03/04/2016 FINDINGS: Limited imaging during the ERCP performed. This demonstrates diffuse intra and extrahepatic biliary dilatation. Filling defects in the common bile duct compatible with choledocholithiasis. Balloon sweep performed. At the conclusion, endoscopic temporary biliary stent inserted. IMPRESSION: Choledocholithiasis with biliary dilatation. ERCP with sphincterotomy, balloon sweep, and stent placement. These images were submitted for radiologic interpretation only. Please see the procedural report for the amount of contrast and the fluoroscopy time utilized. Electronically Signed   By: Jerilynn Mages.  Shick M.D.   On: 03/07/2016 11:34        Scheduled Meds: . atenolol  50 mg Oral Daily  . indomethacin  100 mg Rectal Once  . piperacillin-tazobactam (ZOSYN)  IV  3.375 g Intravenous Q8H   Continuous Infusions: . dextrose 5 % and 0.9% NaCl 100 mL/hr at 03/06/16 2334     LOS: 3 days  Time spent: 20 min    OSEI-BONSU,Zhi Geier, MD [facility] Pager [phone]  If 7PM-7AM, please contact night-coverage [email or web address] Password [email or web address] 03/07/2016, 12:40 PM

## 2016-03-07 NOTE — Op Note (Signed)
[facility] Patient Name: Raymond [name] Procedure Date : 03/07/2016 MRN: [id number] Attending MD: Estill Cotta. Loletha Carrow , MD Date of Birth: 09-Jun-1926 CSN: [id number] Age: 83 Admit Type: Inpatient Procedure:                ERCP Indications:              Suspected bile duct stone(s), Jaundice Providers:                Mallie Mussel L. Loletha Carrow, MD, Burtis Junes, RN, Elspeth Cho                            Tech., Technician, Neldon Newport CRNA, CRNA Referring MD:              Medicines:                General Anesthesia, Indomethacin 100 mg PR (patient                            is on standing dose Zosyn) Complications:            No immediate complications. Estimated Blood Loss:     Estimated blood loss was minimal. Procedure:                Pre-Anesthesia Assessment:                           - Prior to the procedure, a History and Physical                            was performed, and patient medications and                            allergies were reviewed. The patient's tolerance of                            previous anesthesia was also reviewed. The risks                            and benefits of the procedure and the sedation                            options and risks were discussed with the patient.                            All questions were answered, and informed consent                            was obtained. Prior Anticoagulants: The patient has                            taken no previous anticoagulant or antiplatelet                            agents. ASA Grade Assessment: III - A patient with  severe systemic disease. After reviewing the risks                            and benefits, the patient was deemed in                            satisfactory condition to undergo the procedure.                           After obtaining informed consent, the scope was                            passed under direct vision. Throughout the                procedure, the patient's blood pressure, pulse, and                            oxygen saturations were monitored continuously. The                            [id number]) scope was introduced through                            the mouth, and used to inject contrast into and                            used to inject contrast into the bile duct. The                            ERCP was performed with difficulty due to a large                            stone. The patient tolerated the procedure well. Scope In: Scope Out: Findings:      The scout film was normal. The esophagus was successfully intubated       under direct vision without detailed examination of the pharynx, larynx,       and associated structures. The scope was unable to pass the EGJ due to       resistance. Therefore the duodenoscope was rmoved and an EGD scope       passed. One moderate benign-appearing, intrinsic stenosis was found 40       cm from the incisors, and the distal half of the esophagus was tortuous       . This stricture measured 1.1 cm (inner diameter) and was traversed with       the EGD scope. Balloon dilation was performed to 12 and 5mm to allow       passage of the duodenoscope. A large-mouthed diverticulum was found in       the third portion of the duodenum. The remainder of the complete EGD       exam was normal, and that scope was removed.      The duodenoscope was once again inserted through the mouth and passed       with mild resistance through the EGJ to the area of the major papilla.  The major papilla was normal and bile was flowing from it. The minor       papilla was not seen. The bile duct was easily and deeply cannulated       with the traction (standard) sphincterotome. Contrast was injected. I       personally interpreted the bile duct images. There was brisk flow of       contrast through the ducts. Image quality was excellent. Contrast       extended to the  hepatic ducts. The main bile duct was diffusely dilated.       The largest diameter was 15 mm. The middle third of the main bile duct       contained one stone, which was 12 mm in diameter and not impacted,       moving in the duct with contrast injection and cannula movement. 0.035       inch x 260 cm straight Hydra Jagwire was passed into the biliary tree. A       10 mm biliary sphincterotomy was made with a traction (standard)       sphincterotome using ERBE electrocautery. The sphincterotomy oozed a       scant amount of blood. The biliary tree was swept with a 15 mm balloon       starting at the bifurcation. A few small pieces of stone debris were       removed, but the stone was not appreciably reduced in size and would not       pass through the distal duct. There did not appear to be a stricture of       the distal CBD, but the distal duct appeared to be somewhat tortuous (       due to duodenal diverticulae, as evidenced by CT scan images).      Multiple passes were made with the balloon, but the stones could not be       removed. "Short" position of the ampulla would have made passage of the       retrieval basket challenging. One 10 Fr by 7 cm plastic stent with a       single external flap and a single internal flap was placed 7 cm into the       common bile duct. Bile flowed through the stent. The stent was in good       position. Impression:               - Benign-appearing esophageal stenosis.                           - The major papilla appeared normal.                           - The entire main bile duct was dilated.                           - Choledocholithiasis was found. Removal by balloon                            extraction was not accomplished; a stent was                            inserted.                           -  A biliary sphincterotomy was performed.                           - The biliary tree was swept and sludge was found.                           -  One plastic stent was placed into the common bile                            duct. Moderate Sedation:      GETA Recommendation:           - Resume regular diet.                           - No anticoagulation for DVT prophylaxis. (use SCDs)                           - Avoid aspirin and nonsteroidal anti-inflammatory                            medicines for 2 weeks.                           - Repeat ERCP few weeks for stent removal and stone                            extraction with possible cholangioscope. Procedure Code(s):        --- Professional ---                           [phone], Endoscopic retrograde                            cholangiopancreatography (ERCP); with placement of                            endoscopic stent into biliary or pancreatic duct,                            including pre- and post-dilation and guide wire                            passage, when performed, including sphincterotomy,                            when performed, each stent                           [id number], Endoscopic retrograde                            cholangiopancreatography (ERCP); with removal of                            calculi/debris from biliary/pancreatic duct(s) Diagnosis Code(s):        --- Professional ---  K22.2, Esophageal obstruction                           K80.50, Calculus of bile duct without cholangitis                            or cholecystitis without obstruction                           R17, Unspecified jaundice                           K83.8, Other specified diseases of biliary tract CPT copyright 2016 American Medical Association. All rights reserved. The codes documented in this report are preliminary and upon coder review may  be revised to meet current compliance requirements. Joliene Salvador L. Loletha Carrow, MD 03/07/2016 10:44:49 AM This report has been signed electronically. Number of Addenda: 0

## 2016-03-07 NOTE — Transfer of Care (Signed)
Immediate Anesthesia Transfer of Care Note  Patient: Raymond [name]  Procedure(s) Performed: Procedure(s): ENDOSCOPIC RETROGRADE CHOLANGIOPANCREATOGRAPHY (ERCP) (N/A)  Patient Location: PACU  Anesthesia Type:General  Level of Consciousness: sedated  Airway & Oxygen Therapy: Patient Spontanous Breathing  Post-op Assessment: Report given to RN, Post -op Vital signs reviewed and stable and Patient moving all extremities X 4  Post vital signs: Reviewed and stable  Last Vitals:  Vitals:   03/07/16 0524 03/07/16 0741  BP: (!) 185/71 (!) 199/94  Pulse: 61 63  Resp: 20 12  Temp: 36.2 C     Last Pain:  Vitals:   03/07/16 0741  TempSrc: Oral  PainSc:       Patients Stated Pain Goal: 0 ([id number])  Complications: No apparent anesthesia complications

## 2016-03-07 NOTE — H&P (View-Only) (Signed)
Referring Provider: Dr. Barney Drain Primary Care Physician:  Wende Neighbors, MD Primary Gastroenterologist:  Dr. Gala Romney  Reason for Consultation:  Obstructive jaundice  HPI: Raymond [name] is a 83 y.o. 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 He presented to [facility] complaining of 24 hours of abdominal pain. Previously seen in the ER 24 hours prior to that and diagnosed with GERD and d/c home upon symptom resolution; noted elevated LFTs at that time. He started having epigastric pain radiating to his back and his girlfriend of 2 years/caregiver took him back to the ER.   He was admitted to [facility] for elevated LFTs and biliary obstruction on CT scan as below.  LFTs elevated on admission but appear improving: AST/ALT 200/189 -> 314/276 ->192/189 today; Alk phos 240 -> 323 -> 226; Bili 2.0 -> 6.8 -> 4.8 today. Had a mild bump in troponin 0.03, serial enzymes ordered: 0.03 -> 0.07.    IMPRESSION: 1. Moderate intra and extrahepatic biliary ductal dilatation with enhancing mass in the lower common bile duct measuring up to 12 mm. 2. Enlarged periportal and retropancreatic lymph nodes may be reactive or metastatic. 3. Multiple gallstones.  No secondary signs of acute cholecystitis. 4. Right kidney interpolar nonobstructing stone. 5. Two duodenum diverticulum. 6. Mild diverticulosis of residual sigmoid colon. Patent colo-colonic anastomosis. 7. Severe aortic atherosclerosis. 8. Severe aortic valvular and coronary artery calcification.  There was an initial plan for ERCP at [facility] the morning of 03/05/16; started on Zosyn to cover for possible cholangitis.   Had a bump in troponins to 0.07 this AM.  His chart was further reviewed by GI at [facility] and anesthesia and he was deemed too high risk for [facility] procedure due to  bump in troponins to 0.07, thrombocytopenia, comorbidities. After discussion with the hospitalist, GI, and anesthesia it was determined the patient would be best served by transfer to [facility] for a higher level of care due to increased ERCP risks.  Just got to his room here at [facility].  No family present yet.  Patient does not know he is in [place] at [facility] and does not know why he went to the hospital at [facility].  Denies abdominal pain.  Past Medical History:  Diagnosis Date  . Anemia   . ASCVD (arteriosclerotic cardiovascular disease)    2 MI's in 1990s  . Chronic renal insufficiency, stage II (mild)    CrCl in the 60s  . Dementia   . DJD (degenerative joint disease)   . Esophageal dysphagia    Secondary to Schatzki's ring with dilation x2, most recently in 06/09, negative for H. pylori  . GERD (gastroesophageal reflux disease)   . HTN (hypertension)    BP meds stopped 2016 due to recurrent orthostatic hypotension  . Hyperlipidemia   . Hypothyroidism   . Mild cognitive impairment with memory loss   . Myocardial infarction 08-1996   Cardiac cath done but no other intervention that we know of  . Orthostatic hypotension   . Psoriasis   . Thrombocytopenia ([place])    Platelets 99K on 09/2009 labs  . Ulcerative proctitis ([place])    Dx: 2000, started Asacol at that time.  Was taken off asacol 2014 and has done fine since (Dr. Sydell Axon, [facility])    Past Surgical History:  Procedure Laterality Date  . APPENDECTOMY  1940  .  CLEFT LIP REPAIR     As a child  . COLECTOMY  2004   Colonovesicular fistula secondary to diverticulitis requiring sigmoid colectomy and bladder repair  . COLONOSCOPY  06/2007   Dr. Olegario Messier: Few diverticula, normal anastomosiss/p sigmoid colectomy for diverticulitis  . colovesicular fistula  2004  . ESOPHAGEAL DILATION  06-23-07; 06/28/12   H pylori NEG.  2014-reactive gastropathy with surface erosion.  . ESOPHAGOGASTRODUODENOSCOPY  06/2007    Dr. Olegario Messier: symptomatic Schatzki ring, s/p 25m Savary dilator, Erosive duodenitis, s/p bx   . ESOPHAGOGASTRODUODENOSCOPY (EGD) WITH ESOPHAGEAL DILATION N/A 06/28/2012   RDTO:IZTIWPYKcervical web and distal esophageal s/p dilation/Hiatal hernia. Antral erosions (reactive gastropathy, no H.pylori)  . HERNIA REPAIR    . NECK MASS EXCISION  1979    Prior to Admission medications   Medication Sig Start Date End Date Taking? Authorizing Provider  acetaminophen (TYLENOL) 500 MG tablet Take 500 mg by mouth every 6 (six) hours as needed for moderate pain.   Yes Historical Provider, MD  aspirin 81 MG tablet Take 81 mg by mouth daily.     Yes Historical Provider, MD  atorvastatin (LIPITOR) 20 MG tablet TAKE ONE-HALF TABLET BY MOUTH ONCE DAILY 07/18/14  Yes PTammi Sou MD  donepezil (ARICEPT) 10 MG tablet TAKE ONE TABLET BY MOUTH ONCE DAILY AT BEDTIME 10/18/14  Yes PTammi Sou MD  levothyroxine (SYNTHROID, LEVOTHROID) 112 MCG tablet TAKE ONE TABLET BY MOUTH ONCE DAILY BEFORE BREAKFAST 11/22/14  Yes Renee A Kuneff, DO  niacin 500 MG CR capsule Take 2 capsules (1,000 mg total) by mouth daily. 08/23/13  Yes PTammi Sou MD  nitroGLYCERIN (NITROSTAT) 0.4 MG SL tablet Place 1 tablet (0.4 mg total) under the tongue every 5 (five) minutes as needed for chest pain. 06/26/14  Yes PTammi Sou MD  ondansetron (ZOFRAN-ODT) 4 MG disintegrating tablet Take 1 tablet by mouth daily as needed for nausea/vomiting. 02/29/16  Yes Historical Provider, MD  pantoprazole (PROTONIX) 40 MG tablet TAKE ONE TABLET BY MOUTH ONCE DAILY BEFORE BREAKFAST 11/28/15  Yes LMahala Menghini PA-C    Current Facility-Administered Medications  Medication Dose Route Frequency Provider Last Rate Last Dose  . 0.9 %  sodium chloride infusion   Intravenous Continuous JKathie Dike MD 100 mL/hr at 03/05/16 1534    . morphine 2 MG/ML injection 2 mg  2 mg Intravenous Q4H PRN RPhillips Grout MD      . ondansetron ([facility] tablet  4 mg  4 mg Oral Q6H PRN RPhillips Grout MD       Or  . ondansetron (ZOFRAN) injection 4 mg  4 mg Intravenous Q6H PRN RPhillips Grout MD      . piperacillin-tazobactam (ZOSYN) IVPB 3.375 g  3.375 g Intravenous Q8H RPhillips Grout MD   3.375 g at 03/05/16 09983 . potassium chloride 30 mEq in sodium chloride 0.9 % 265 mL (KCL MULTIRUN) IVPB  30 mEq Intravenous Once JKathie Dike MD        Allergies as of 03/04/2016  . (No Known Allergies)    Family History  Problem Relation Age of Onset  . Cancer Father     pt doesn't remember type  . Heart disease Daughter     heart defect: d age 81   Social History   Social History  . Marital status: Widowed    Spouse name: N/A  . Number of children: N/A  . Years of education: N/A   Occupational  History  . Retired from [employer]  . Smoking status: Former Smoker    Packs/day: 1.00    Years: 10.00    Types: Cigarettes    Quit date: 01/11/1970  . Smokeless tobacco: Never Used     Comment: Minimal use at 20  . Alcohol use No  . Drug use: No  . Sexual activity: Not Currently   Other Topics Concern  . Not on file   Social History Narrative   Widowed, has had girlfriend x 30 yrs, has 1 son.  Lives in [place], son lives in [place].   Occupation: [employer] in [place].  Retired 19s.   No regular exercise.  Smoked in the WAY distant past.   Alcohol: none.      Review of Systems: ROS O/W negative except as mentioned in HPI.  Physical Exam: Vital signs in last 24 hours: Temp:  [97 F (36.1 C)-98.6 F (37 C)] 98.6 F (37 C) (02/23 1521) Pulse Rate:  [69-102] 72 (02/23 1521) Resp:  [18-31] 18 (02/23 0007) BP: (109-207)/(61-107) 142/78 (02/23 1521) SpO2:  [96 %-100 %] 99 % (02/23 1521) Weight:  [137 lb 3.2 oz (62.2 kg)-148 lb (67.1 kg)] 137 lb 3.2 oz (62.2 kg) (02/23 0007)   General:   Alert, Well-developed, well-nourished, pleasant and cooperative in NAD; pleasantly demented.  Mild jaundice  noted. Head:  Normocephalic and atraumatic. Eyes:  Mild scleral icterus noted. Ears:  Normal auditory acuity. Mouth:  No deformity or lesions.   Lungs:  Clear throughout to auscultation.  No wheezes, crackles, or rhonchi.  No increased WOB. Heart:  Regular rate and rhythm; SEM noted. Abdomen:  Soft, non-distended.  BS present.  Non-tender. Rectal:  Deferred  Msk:  Symmetrical without gross deformities. Pulses:  Normal pulses noted. Extremities:  Without clubbing or edema. Neurologic:  Alert and oriented x 2;  grossly normal neurologically. Skin:  Intact without significant lesions or rashes.  Jaundice noted. Psych:  Alert and cooperative. Normal mood and affect.  Intake/Output from previous day: 02/22 0701 - 02/23 0700 In: 1156.3 [I.V.:656.3; IV Piggyback:500] Out: 200 [Urine:200]  Lab Results:  Recent Labs  03/03/16 2100 03/04/16 1937 03/05/16 0531  WBC 6.0 7.6 6.7  HGB 11.3* 13.0 10.5*  HCT 33.0* 38.1* 30.1*  PLT 123* 129* 99*   BMET  Recent Labs  03/03/16 2100 03/04/16 1937 03/05/16 0531  NA 139 137 138  K 3.3* 3.5 3.1*  CL 103 99* 104  CO2 _0 GLUCOSE 165* 151* 112*  BUN _1 CREATININE 1.11 1.00 0.97  CALCIUM 9.0 9.5 8.1*   LFT  Recent Labs  03/05/16 0531  PROT 6.1*  ALBUMIN 3.0*  AST 192*  ALT 189*  ALKPHOS 226*  BILITOT 4.8*   PT/INR  Recent Labs  03/05/16 0531  LABPROT 15.6*  INR 1.23   Studies/Results: Ct Abdomen Pelvis W Contrast  Result Date: 03/04/2016 CLINICAL DATA:  83 y/o  M; epigastric pain. EXAM: CT ABDOMEN AND PELVIS WITH CONTRAST TECHNIQUE: Multidetector CT imaging of the abdomen and pelvis was performed using the standard protocol following bolus administration of intravenous contrast. CONTRAST:  67m ISOVUE-300 IOPAMIDOL (ISOVUE-300) INJECTION 61%, 1059mISOVUE-300 IOPAMIDOL (ISOVUE-300) INJECTION 61% COMPARISON:  None. FINDINGS: Lower chest: Severe aortic valvular and coronary artery calcifications.  Hepatobiliary: No focal liver lesion. Several gallstones layer dependently within the gallbladder. No gallbladder wall thickening. Moderate intra hepatic biliary ductal dilatation and enlargement of common bile duct up to  14 mm. Enhancing mass within the lower common bile duct at the pancreatic head measuring 10 x 12 mm (AP by ML series 2, image 28.) Pancreas: Unremarkable. No pancreatic ductal dilatation or surrounding inflammatory changes. Spleen: Normal in size without focal abnormality. Adrenals/Urinary Tract: Multiple simple appearing renal cysts in the left kidney measuring up to 45 mm. Right kidney interpolar nonobstructing stone measuring 5 mm. No hydronephrosis. Normal bladder. Normal adrenal glands. Stomach/Bowel: Fluid and air-filled structures arising from the third segment of duodenum are compatible with duodenum diverticulum measuring 31 x 39 mm on the right and 31 x 24 mm on the left (series 5 image 38 and 39). No obstructive or inflammatory changes of the bowel. Prior partial colectomy with patent anastomosis in the lower mid abdomen. Sigmoid diverticulosis without evidence for diverticulitis. The appendix is not identified. Moderate volume of stool throughout the colon. Vascular/Lymphatic: Aortic atherosclerosis. Enlarged periportal and retropancreatic lymph nodes (series 2, image 22 and 23). Reproductive: Prostate calcification. Other: Small paraumbilical hernia containing fat.  No ascites. Musculoskeletal: No acute osseous abnormality. Mild multilevel degenerative changes of the spine greatest at the L4-5 level. IMPRESSION: 1. Moderate intra and extrahepatic biliary ductal dilatation with enhancing mass in the lower common bile duct measuring up to 12 mm. 2. Enlarged periportal and retropancreatic lymph nodes may be reactive or metastatic. 3. Multiple gallstones.  No secondary signs of acute cholecystitis. 4. Right kidney interpolar nonobstructing stone. 5. Two duodenum diverticulum. 6. Mild  diverticulosis of residual sigmoid colon. Patent colo-colonic anastomosis. 7. Severe aortic atherosclerosis. 8. Severe aortic valvular and coronary artery calcification. Electronically Signed   By: Kristine Garbe M.D.   On: 03/04/2016 22:18   Dg Chest Portable 1 View  Result Date: 03/03/2016 CLINICAL DATA:  83 y/o M; mid lower chest pain and shortness of breath. EXAM: PORTABLE CHEST 1 VIEW COMPARISON:  12/12/2014 chest radiograph. FINDINGS: Stable heart size and mediastinal contours are within normal limits. Aortic atherosclerosis with calcification. Stable biapical mild pleuroparenchymal scarring. Both lungs are clear. The visualized skeletal structures are unremarkable. IMPRESSION: No active disease. Electronically Signed   By: Kristine Garbe M.D.   On: 03/03/2016 21:08   IMPRESSION:  *83 year old male who presented with abdominal pain, obstructive jaundice, with CT suggesting CBD mass ? stone 12 mm and CBD dilation 14 mm. Presented with abdominal pain, does not appear to be in pain at this time and not tender on exam. LFTs elevated on admission but appear improving: AST/ALT 200/189 -> 314/276 ->192/189 today; Alk phos 240 -> 323 -> 226; Bili 2.0 -> 6.8 -> 4.8 today. Had a mild bump in troponin 0.03, serial enzymes ordered: 0.03 -> 0.07.  His chart was further reviewed by GI at [facility] and anesthesia and he was deemed too high risk for [facility] procedure due to bump in troponins to 0.07, thrombocytopenia, comorbidities. After discussion with the hospitalist, GI, and anesthesia it was determined the patient would be best served by transfer to [facility] for a higher level of care due to increased ERCP risks.  PLAN: -Continue Zosyn. -Trend LFT's. -ERCP this weekend, may or may not occur 2/24.  Will allow him a heart healthy diet and then NPO after midnight in case he can go for procedure on 2/24.  ZEHR, JESSICA D.  03/05/2016, 3:46 PM  Pager number [phone]  I have reviewed the entire  case in detail with the above APP and discussed the plan in detail.  Therefore, I agree with the diagnoses recorded above. In  addition,  I have personally interviewed and examined the patient and have personally reviewed any abdominal/pelvic CT scan images.  My additional thoughts are as follows:  The patient cannot provide helpful history.  Providers at [facility] apparently did not feel they could provide the care this patient requires.  We will happily be of service.  Fortunately, his long time partner and his son arrived while I was there.  I explained the clinical scenario and what is involved with ERCP.  Tomorrow's schedule may not allow.  If not, then the plan is for the following day.  Consent obtained after answering all questions. He is at increased risk due to age.  Patient at increased risk for cardiopulmonary complications of procedure due to medical comorbidities.  Cause of modest thrombocytopenia unclear - no signs of sepsis/DIC.  Will monitor.  It does not currently preclude ERCP or sphincterotomy if needed.  [name] Pager [phone]  Mon-Fri 8a-5p [phone] after 5p, weekends, holidays

## 2016-03-07 NOTE — Anesthesia Postprocedure Evaluation (Signed)
Anesthesia Post Note  Patient: CAIDIN TAVERAS  Procedure(s) Performed: Procedure(s) (LRB): ENDOSCOPIC RETROGRADE CHOLANGIOPANCREATOGRAPHY (ERCP) (N/A)  Patient location during evaluation: PACU Anesthesia Type: General Level of consciousness: awake and alert Pain management: pain level controlled Vital Signs Assessment: post-procedure vital signs reviewed and stable Respiratory status: spontaneous breathing, nonlabored ventilation, respiratory function stable and patient connected to nasal cannula oxygen Cardiovascular status: blood pressure returned to baseline and stable Postop Assessment: no signs of nausea or vomiting Anesthetic complications: no       Last Vitals:  Vitals:   03/07/16 1055 03/07/16 1200  BP: (!) 153/76 (!) 164/73  Pulse: (!) 59 63  Resp: 18 16  Temp:  36.5 C    Last Pain:  Vitals:   03/07/16 1200  TempSrc: Oral  PainSc:                  Jacquline Terrill DAVID

## 2016-03-08 ENCOUNTER — Encounter (HOSPITAL_COMMUNITY): Payer: Self-pay | Admitting: Gastroenterology

## 2016-03-08 DIAGNOSIS — K831 Obstruction of bile duct: Secondary | ICD-10-CM

## 2016-03-08 DIAGNOSIS — F0391 Unspecified dementia with behavioral disturbance: Secondary | ICD-10-CM

## 2016-03-08 DIAGNOSIS — E876 Hypokalemia: Secondary | ICD-10-CM

## 2016-03-08 LAB — CBC WITH DIFFERENTIAL/PLATELET
BASOS PCT: 0 %
Basophils Absolute: 0 10*3/uL (ref 0.0–0.1)
Eosinophils Absolute: 0.1 10*3/uL (ref 0.0–0.7)
Eosinophils Relative: 3 %
HEMATOCRIT: 28.3 % — AB (ref 39.0–52.0)
HEMOGLOBIN: 9.4 g/dL — AB (ref 13.0–17.0)
LYMPHS ABS: 0.6 10*3/uL — AB (ref 0.7–4.0)
Lymphocytes Relative: 12 %
MCH: 30.5 pg (ref 26.0–34.0)
MCHC: 33.2 g/dL (ref 30.0–36.0)
MCV: 91.9 fL (ref 78.0–100.0)
MONOS PCT: 10 %
Monocytes Absolute: 0.4 10*3/uL (ref 0.1–1.0)
NEUTROS ABS: 3.4 10*3/uL (ref 1.7–7.7)
NEUTROS PCT: 75 %
Platelets: 131 10*3/uL — ABNORMAL LOW (ref 150–400)
RBC: 3.08 MIL/uL — AB (ref 4.22–5.81)
RDW: 13.4 % (ref 11.5–15.5)
WBC: 4.5 10*3/uL (ref 4.0–10.5)

## 2016-03-08 LAB — COMPREHENSIVE METABOLIC PANEL
ALT: 74 U/L — ABNORMAL HIGH (ref 17–63)
ANION GAP: 6 (ref 5–15)
AST: 44 U/L — ABNORMAL HIGH (ref 15–41)
Albumin: 2.4 g/dL — ABNORMAL LOW (ref 3.5–5.0)
Alkaline Phosphatase: 140 U/L — ABNORMAL HIGH (ref 38–126)
BUN: 8 mg/dL (ref 6–20)
CALCIUM: 7.8 mg/dL — AB (ref 8.9–10.3)
CHLORIDE: 110 mmol/L (ref 101–111)
CO2: 24 mmol/L (ref 22–32)
Creatinine, Ser: 0.94 mg/dL (ref 0.61–1.24)
GFR calc non Af Amer: >60 mL/min (ref >60)
Glucose, Bld: 112 mg/dL — ABNORMAL HIGH (ref 65–99)
Potassium: 3.4 mmol/L — ABNORMAL LOW (ref 3.5–5.1)
SODIUM: 140 mmol/L (ref 135–145)
Total Bilirubin: 1.7 mg/dL — ABNORMAL HIGH (ref 0.3–1.2)
Total Protein: 5.2 g/dL — ABNORMAL LOW (ref 6.5–8.1)

## 2016-03-08 MED ORDER — RISPERIDONE 0.5 MG PO TABS
0.5000 mg | ORAL_TABLET | Freq: Two times a day (BID) | ORAL | Status: DC
Start: 2016-03-08 — End: 2016-03-09
  Administered 2016-03-08 – 2016-03-09 (×3): 0.5 mg via ORAL
  Filled 2016-03-08 (×4): qty 1

## 2016-03-08 MED ORDER — BENZONATATE 100 MG PO CAPS
200.0000 mg | ORAL_CAPSULE | Freq: Three times a day (TID) | ORAL | Status: DC
  Administered 2016-03-08 – 2016-03-09 (×3): 200 mg via ORAL
  Administered 2016-03-09: 100 mg via ORAL
  Filled 2016-03-08 (×4): qty 2

## 2016-03-08 MED ORDER — TRAMADOL HCL 50 MG PO TABS
50.0000 mg | ORAL_TABLET | Freq: Two times a day (BID) | ORAL | Status: DC | PRN
  Administered 2016-03-08: 50 mg via ORAL
  Filled 2016-03-08: qty 1

## 2016-03-08 MED ORDER — POTASSIUM CHLORIDE CRYS ER 20 MEQ PO TBCR
40.0000 meq | EXTENDED_RELEASE_TABLET | ORAL | Status: AC
  Administered 2016-03-08 – 2016-03-09 (×2): 40 meq via ORAL
  Filled 2016-03-08 (×2): qty 2

## 2016-03-08 MED ORDER — AMLODIPINE BESYLATE 5 MG PO TABS
2.5000 mg | ORAL_TABLET | Freq: Every day | ORAL | Status: DC
  Administered 2016-03-08 – 2016-03-09 (×2): 2.5 mg via ORAL
  Filled 2016-03-08 (×2): qty 1

## 2016-03-08 MED ORDER — ACETAMINOPHEN 500 MG PO TABS: 1000.0000 mg | ORAL_TABLET | Freq: Four times a day (QID) | ORAL | Status: DC | PRN

## 2016-03-08 MED ORDER — HALOPERIDOL LACTATE 5 MG/ML IJ SOLN
1.0000 mg | Freq: Four times a day (QID) | INTRAMUSCULAR | Status: DC | PRN
  Administered 2016-03-08 – 2016-03-09 (×3): 1 mg via INTRAVENOUS
  Filled 2016-03-08 (×3): qty 1

## 2016-03-08 MED ORDER — AMPICILLIN-SULBACTAM SODIUM 3 (2-1) G IJ SOLR
3.0000 g | Freq: Three times a day (TID) | INTRAMUSCULAR | Status: DC
  Administered 2016-03-08 – 2016-03-09 (×2): 3 g via INTRAVENOUS
  Filled 2016-03-08 (×3): qty 3

## 2016-03-08 NOTE — Progress Notes (Signed)
PROGRESS NOTE [facility]   [name]   [id number] DOB: [birth year]  DOA: 03/04/2016 PCP: Wende Neighbors, MD   Brief Narrative:  Raymond [name] a 83 y.o.malewith multiple complicated medical history including severe dementia, chronic renal insufficiency, dysphagia, GERD, CAD status post MI, thrombocytopenia, ulcerative proctitis who presented to [facility] with epigastric abdominal pain and nausea associated with elevated LFT's and 12 mm CBD stone with 14 mm CBD dilatation.He also had elevated troponins without acute EKG changes or chest pain and is felt to be demand ischemia related due to his acute illness.  He was transferred to [facility] on 03/05/2016 for further management of choledocholithiasis, due to need of ERCP.  Subjective: Patient seen and examined with family at bedside. He slight sedated due to ativan given this morning due to agitation. Reports no complaints at this time   Assessment & Plan: Choledocholithiasis: s/p ERCP - large stone could not be removed, stent placed.  GI recommendations appreciated  Antibiotics for 5 more day will transition to oral tomorrow  LFTs improving after stent  Need GI follow up to repeat ERCP in few weeks   Hypokalemia: Replete K  Check Mg in AM  Monitor BMP   Elevated Troponin: No obvious evidence of acute changes on EKG No chest pains Likely demand ischemia related to acute illness Follow clinically Consider cardiology consult as needed  Hypertension: BP fluctuating ? Agitation vs pain  Patient with history of hypertension Had been on atenolol but was discontinued due to orthostasis Atenolol was resumed on admission - BP continues to be unstable  Will add Norvasc   Thrombocytopenia: Chronic  Follow clinically  Severe dementia - deconditioning likely natural course of the diseases, High risk of sundowning  Will add Risperdal 0.5 mg BID  Avoid ativan as can increase delirium  Keep room  bright  Haldol PRN agitation  PT eval - likely will need SNF  DVT prophylaxis: SCDs Code Status: FULL  Family Communication: Family at bedside  Disposition Plan: Likely to go to SNF 24 -48 hrs  Consultants:   GI    Procedures:   ERCP  Impression:               - - Benign-appearing esophageal stenosis.  - The major papilla appeared normal. - The entire main bile duct was dilated. -Choledocholithiasis was found. Removal by balloon extraction was not accomplished; a stent was inserted. - A biliary sphincterotomy was performed. - The biliary tree was swept and sludge was found. - One plastic stent was placed into the common bile duct.   Recommendation:            - Resume regular diet. - No anticoagulation for DVT prophylaxis. (use SCDs) - Avoid aspirin and nonsteroidal anti-inflammatory medicines for 2 weeks. - Repeat ERCP few weeks for stent removal and stone extraction with possible cholangioscope.  Antimicrobials:  Zosyn 03/05/16 - 03/08/16  Unasy 03/08/16   Objective: Vitals:   03/07/16 2012 03/08/16 0557 03/08/16 1000 03/08/16 1700  BP: (!) 183/86 134/78 136/69 (!) 166/87  Pulse: 71 67 69 (!) 102  Resp: 19 17 20 18   Temp: 97.6 F (36.4 C) 98.3 F (36.8 C) 98.2 F (36.8 C) 99.9 F (37.7 C)  TempSrc:   Oral Oral  SpO2: 98% 97% 98% 98%  Weight: 66.2 kg (146 lb)     Height:        Intake/Output Summary (Last 24 hours) at 03/08/16 1857 Last data filed at 03/08/16  1500  Gross per 24 hour  Intake           2832.5 ml  Output             1775 ml  Net           1057.5 ml   Filed Weights   03/06/16 0543 03/06/16 2229 03/07/16 2012  Weight: 68.1 kg (150 lb 1.6 oz) 64.9 kg (143 lb) 66.2 kg (146 lb)    Examination:  General exam: Sleepy but responsive and answering questions  Respiratory system: Clear to auscultation. No wheezes,crackle or rhonchi Cardiovascular system: S1 & S2 heard, RRR. No JVD, murmurs, rubs or gallops Gastrointestinal system: Soft, RUQ  tenderness to palpation. + BS  Central nervous system: Not oriented to time or place  Extremities: Trace LE edema.  Skin: No rashes Psychiatry: Mood & affect dementia     Data Reviewed: I have personally reviewed following labs and imaging studies  CBC:  Recent Labs Lab 03/03/16 2100 03/04/16 1937 03/05/16 0531 03/06/16 0510 03/08/16 0838  WBC 6.0 7.6 6.7 5.4 4.5  NEUTROABS  --  6.2  --   --  3.4  HGB 11.3* 13.0 10.5* 11.5* 9.4*  HCT 33.0* 38.1* 30.1* 35.2* 28.3*  MCV 92.4 91.4 92.0 92.1 91.9  PLT 123* 129* 99* 122* [id number]*   Basic Metabolic Panel:  Recent Labs Lab 03/04/16 1937 03/05/16 0531 03/06/16 0510 03/07/16 0724 03/08/16 0838  NA 137 138 141 139 140  K 3.5 3.1* 3.9 3.2* 3.4*  CL 99* 104 105 104 110  CO2 25 27 26 25 24   GLUCOSE 151* 112* 83 128* 112*  BUN 16 14 11 6 8   CREATININE 1.00 0.97 0.98 0.94 0.94  CALCIUM 9.5 8.1* 9.0 8.6* 7.8*   GFR: Estimated Creatinine Clearance: 51.8 mL/min (by C-G formula based on SCr of 0.94 mg/dL). Liver Function Tests:  Recent Labs Lab 03/04/16 1937 03/05/16 0531 03/06/16 0510 03/07/16 0724 03/08/16 0838  AST 314* 192* 111* 62* 44*  ALT 276* 189* 152* 112* 74*  ALKPHOS 323* 226* 211* 186* 140*  BILITOT 6.8* 4.8* 2.5* 1.7* 1.7*  PROT 8.1 6.1* 6.4* 6.2* 5.2*  ALBUMIN 4.0 3.0* 3.0* 2.8* 2.4*    Recent Labs Lab 03/03/16 2100 03/04/16 1937  LIPASE 21 15   No results for input(s): AMMONIA in the last 168 hours. Coagulation Profile:  Recent Labs Lab 03/05/16 0531  INR 1.23   Cardiac Enzymes:  Recent Labs Lab 03/03/16 2100 03/04/16 1937 03/05/16 0531  TROPONINI <0.03 0.03* 0.07*   BNP (last 3 results) No results for input(s): PROBNP in the last 8760 hours. HbA1C: No results for input(s): HGBA1C in the last 72 hours. CBG: No results for input(s): GLUCAP in the last 168 hours. Lipid Profile: No results for input(s): CHOL, HDL, LDLCALC, TRIG, CHOLHDL, LDLDIRECT in the last 72 hours. Thyroid Function  Tests: No results for input(s): TSH, T4TOTAL, FREET4, T3FREE, THYROIDAB in the last 72 hours. Anemia Panel: No results for input(s): VITAMINB12, FOLATE, FERRITIN, TIBC, IRON, RETICCTPCT in the last 72 hours. Sepsis Labs: No results for input(s): PROCALCITON, LATICACIDVEN in the last 168 hours.  No results found for this or any previous visit (from the past 240 hour(s)).     Radiology Studies: Dg Ercp Biliary & Pancreatic Ducts  Result Date: 03/07/2016 CLINICAL DATA:  Obstructive jaundice, suspect choledocholithiasis EXAM: ERCP stone removal with balloon extraction.  Biliary sphincterotomy. TECHNIQUE: Multiple spot images obtained with the fluoroscopic device and submitted for interpretation post-procedure. FLUOROSCOPY  TIME:  Fluoroscopy Time:  8 minutes 51 seconds COMPARISON:  03/04/2016 FINDINGS: Limited imaging during the ERCP performed. This demonstrates diffuse intra and extrahepatic biliary dilatation. Filling defects in the common bile duct compatible with choledocholithiasis. Balloon sweep performed. At the conclusion, endoscopic temporary biliary stent inserted. IMPRESSION: Choledocholithiasis with biliary dilatation. ERCP with sphincterotomy, balloon sweep, and stent placement. These images were submitted for radiologic interpretation only. Please see the procedural report for the amount of contrast and the fluoroscopy time utilized. Electronically Signed   By: Jerilynn Mages.  Shick M.D.   On: 03/07/2016 11:34    Scheduled Meds: . ampicillin-sulbactam (UNASYN) IV  3 g Intravenous Q8H  . atenolol  50 mg Oral Daily  . benzonatate  200 mg Oral TID  . indomethacin  100 mg Rectal Once  . potassium chloride  20 mEq Oral BID  . risperiDONE  0.5 mg Oral BID   Continuous Infusions: . dextrose 5 % and 0.9% NaCl 50 mL/hr at 03/08/16 1355     LOS: 4 days    Chipper Oman, MD Pager: Text Page via [email or web address]  [phone]  If 7PM-7AM, please contact night-coverage [email or web address] Password  [facility] 03/08/2016, 6:57 PM

## 2016-03-08 NOTE — Progress Notes (Signed)
Pt has attempted multiple times to climb out of the bed. RN found patient with both legs over the side of the bed 3 times. - Bed alarm alerted staff, and nurse managed to put patient back to bed. Pt became agitated and started striking at nurse  with hands. Patient appears unable to follow commands. Called physician to apply waist posy and physician denied restraint request.

## 2016-03-08 NOTE — Progress Notes (Addendum)
In to see patient and family on leadership rounds. Patients' daughter in law and friend in room at chairside. Expressed some concerns with RN requesting for a family member to sit with patient and night due to patient consistently attempting to get out of bed. Also concerned with RN giving patient medication this morning to assist in calming patient. Also requesting to speak with department social worker to answer some questions about discharge disposition. Service recovery provided. Explained that sitters for safety are not guaranteed.Discussed the expectation that patients are sitter free at least 24hrs prior to being d/cd to SNF.  Discussed the plan to order a low bed with mats as an additional intervention for patient safety. CSW Crawford Givens made aware of the request for family to speak with her.   Will continue to monitor. Raymond [name], Raymond [name]

## 2016-03-08 NOTE — Evaluation (Signed)
Physical Therapy Evaluation Patient Details Name: Raymond [name] MRN: [id number] DOB: 1926-04-25 Today's Date: 03/08/2016   History of Present Illness  pt is an 83 y/o male with pmh of dementia, CKD, CAD admitted to ED x2 with abdominal pain and then more epigastric pain.  Work  up suggested Choledocholithiasis.  ERCP completed, but unable to remove stone, so stent placed.  Clinical Impression  Pt admitted with/for epigastric pain determined to be due to choledolithiasis, stented in ERCP.  On eval, pt is delirious and needs total 2 person assist..  Pt currently limited functionally due to the problems listed. ( See problems list.)   Pt will benefit from PT to maximize function and safety in order to get ready for next venue listed below.     Follow Up Recommendations SNF    Equipment Recommendations  Rolling walker with 5" wheels (RW if doesn't have one to use)    Recommendations for Other Services       Precautions / Restrictions Precautions Precautions: Fall Restrictions Weight Bearing Restrictions: No      Mobility  Bed Mobility Overal bed mobility: Needs Assistance Bed Mobility: Supine to Sit;Sit to Supine     Supine to sit: Total assist;+2 for physical assistance Sit to supine: Total assist;+2 for physical assistance   General bed mobility comments: progressed slow enough to realize any assist pt might give, but still total assist  Transfers Overall transfer level: Needs assistance   Transfers: Sit to/from Stand Sit to Stand: Total assist;+2 physical assistance         General transfer comment: sit to stand x3 with 2 person total assist  see standing comments  Ambulation/Gait             General Gait Details: unable today due to mentation  Stairs            Wheelchair Mobility    Modified Rankin (Stroke Patients Only)       Balance Overall balance assessment: Needs assistance Sitting-balance support: Single extremity supported;Bilateral  upper extremity supported Sitting balance-Leahy Scale: Poor Sitting balance - Comments: poor due to decr. mentation   Standing balance support: Bilateral upper extremity supported Standing balance-Leahy Scale: Poor Standing balance comment: stood x3.  1st and 2nd trials, pt fearful with heavy list Left and L LE right of midline.  3rd trial with significant facilitation able to get pt standing in a shoulder width stance, with 3/4 upright posture with facilitation at head and shoulders.  Pt then able to stand with flexed posture and min +2. holding to the RW.                             Pertinent Vitals/Pain Pain Assessment: Faces Faces Pain Scale: Hurts little more Pain Location: vague Pain Descriptors / Indicators: Moaning Pain Intervention(s): Monitored during session;Repositioned    Home Living Family/patient expects to be discharged to:: Private residence Living Arrangements: Spouse/significant other Available Help at Discharge: Available 24 hours/day;Available PRN/intermittently Type of Home: House Home Access: Stairs to enter Entrance Stairs-Rails: Chemical engineer of Steps: 2-6 Home Layout: One level Home Equipment: Walker - 2 wheels      Prior Function                 Hand Dominance        Extremity/Trunk Assessment   Upper Extremity Assessment Upper Extremity Assessment: Difficult to assess due to impaired cognition    Lower Extremity Assessment  Lower Extremity Assessment: Difficult to assess due to impaired cognition;Generalized weakness       Communication      Cognition Arousal/Alertness: Lethargic Behavior During Therapy: Agitated;Restless Overall Cognitive Status: Impaired/Different from baseline                 General Comments: unable to determine due to arousal    General Comments      Exercises     Assessment/Plan    PT Assessment Patient needs continued PT services  PT Problem List Decreased  strength;Decreased activity tolerance;Decreased balance;Decreased mobility;Decreased cognition;Pain       PT Treatment Interventions Gait training;Functional mobility training;Therapeutic activities;Balance training;Patient/family education;DME instruction    PT Goals (Current goals can be found in the Care Plan section)  Acute Rehab PT Goals Patient Stated Goal: pt unable PT Goal Formulation: With patient/family Time For Goal Achievement: 03/22/16 Potential to Achieve Goals: Fair    Frequency Min 3X/week   Barriers to discharge        Co-evaluation               End of Session   Activity Tolerance: Patient tolerated treatment well Patient left: in bed;with call bell/phone within reach;with bed alarm set;with family/visitor present Nurse Communication: Mobility status PT Visit Diagnosis: Muscle weakness (generalized) (M62.81);Difficulty in walking, not elsewhere classified (R26.2)         Time: [id number] PT Time Calculation (min) (ACUTE ONLY): 25 min   Charges:   PT Evaluation $PT Eval Moderate Complexity: 1 Procedure PT Treatments $Therapeutic Activity: 8-22 mins   PT G CodesTessie Fass Ciearra [name] 03/08/2016, 4:55 PM 03/08/2016  Donnella Sham, PT [phone]  (pager)

## 2016-03-08 NOTE — Progress Notes (Signed)
Daily Rounding Note  03/08/2016, 10:30 AM  LOS: 4 days   SUBJECTIVE:   Chief complaint: Agitated overnight.     Family wondering about PT before discharge as pt in bed for bulk of the last week and physically more declined than baseline.    OBJECTIVE:         Vital signs in last 24 hours:    Temp:  [97.6 F (36.4 C)-98.3 F (36.8 C)] 98.3 F (36.8 C) (02/26 0557) Pulse Rate:  [58-71] 67 (02/26 0557) Resp:  [15-19] 17 (02/26 0557) BP: (134-183)/(73-86) 134/78 (02/26 0557) SpO2:  [95 %-100 %] 97 % (02/26 0557) Weight:  [66.2 kg (146 lb)] 66.2 kg (146 lb) (02/25 2012) Last BM Date: 03/05/16 Filed Weights   03/06/16 0543 03/06/16 2229 03/07/16 2012  Weight: 68.1 kg (150 lb 1.6 oz) 64.9 kg (143 lb) 66.2 kg (146 lb)   General: sleeping quietly, awakened to exam.  Not in any distress   Heart: RRR Chest: clear bil.   Abdomen: soft, NT, ND.  Active BS  Extremities: no CCE Neuro/Psych:  Responds to name but not speaking much, more moans and grunts.  Not oriented to place, year etc.  Moves all 4 limbs.   Intake/Output from previous day: 02/25 0701 - 02/26 0700 In: 2446.7 [I.V.:2396.7; IV Piggyback:50] Out: 2376 [Urine:2375; Blood:1]  Intake/Output this shift: No intake/output data recorded.  Lab Results:  Recent Labs  03/06/16 0510 03/08/16 0838  WBC 5.4 4.5  HGB 11.5* 9.4*  HCT 35.2* 28.3*  PLT 122* 131*   BMET  Recent Labs  03/06/16 0510 03/07/16 0724 03/08/16 0838  NA 141 139 140  K 3.9 3.2* 3.4*  CL 105 104 110  CO2 26 25 24   GLUCOSE 83 128* 112*  BUN 11 6 8   CREATININE 0.98 0.94 0.94  CALCIUM 9.0 8.6* 7.8*   LFT  Recent Labs  03/06/16 0510 03/07/16 0724 03/08/16 0838  PROT 6.4* 6.2* 5.2*  ALBUMIN 3.0* 2.8* 2.4*  AST 111* 62* 44*  ALT 152* 112* 74*  ALKPHOS 211* 186* 140*  BILITOT 2.5* 1.7* 1.7*   PT/INR No results for input(s): LABPROT, INR in the last 72 hours. Hepatitis  Panel No results for input(s): HEPBSAG, HCVAB, HEPAIGM, HEPBIGM in the last 72 hours.  Studies/Results: Dg Ercp Biliary & Pancreatic Ducts  Result Date: 03/07/2016 CLINICAL DATA:  Obstructive jaundice, suspect choledocholithiasis EXAM: ERCP stone removal with balloon extraction.  Biliary sphincterotomy. TECHNIQUE: Multiple spot images obtained with the fluoroscopic device and submitted for interpretation post-procedure. FLUOROSCOPY TIME:  Fluoroscopy Time:  8 minutes 51 seconds COMPARISON:  03/04/2016 FINDINGS: Limited imaging during the ERCP performed. This demonstrates diffuse intra and extrahepatic biliary dilatation. Filling defects in the common bile duct compatible with choledocholithiasis. Balloon sweep performed. At the conclusion, endoscopic temporary biliary stent inserted. IMPRESSION: Choledocholithiasis with biliary dilatation. ERCP with sphincterotomy, balloon sweep, and stent placement. These images were submitted for radiologic interpretation only. Please see the procedural report for the amount of contrast and the fluoroscopy time utilized. Electronically Signed   By: Jerilynn Mages.  Shick M.D.   On: 03/07/2016 11:34   Scheduled Meds: . atenolol  50 mg Oral Daily  . indomethacin  100 mg Rectal Once  . piperacillin-tazobactam (ZOSYN)  IV  3.375 g Intravenous Q8H  . potassium chloride  20 mEq Oral BID   Continuous Infusions: . dextrose 5 % and 0.9% NaCl 100 mL/hr at 03/07/16 2240   PRN Meds:.guaiFENesin-dextromethorphan, LORazepam,  morphine injection, ondansetron **OR** ondansetron (ZOFRAN) IV   ASSESMENT:   *  Jaundice 2/25 ERCP with sphincterotomy: benign esophageal stenosis.  Dilated main bile duct.  Balloon removal of CBD sludge, unable to remove CBD stones so plastic stent placed.   Day 4 Zosyn.    *  Normocytic anemia.    *  Thrombocytopenia.    *  Dementia.  Agitation being treated with Ativan.       PLAN   *  Repeat ERCP few weeks for stent removal and stone extraction  with possible cholangioscope.  Has follow up with Dr Loletha Carrow in office 3/12 at 10:45.   *  ? How long to continue abx.  No WBC elevation, fevers since arrival,      Azucena Freed  03/08/2016, 10:30 AM Pager: [phone]

## 2016-03-08 NOTE — Care Management Important Message (Signed)
Important Message  Patient Details  Name: Raymond [name] MRN: [id number] Date of Birth: Jun 13, 1926   Medicare Important Message Given:  Yes    Orbie Pyo 03/08/2016, 3:44 PM

## 2016-03-08 NOTE — Clinical Social Work Note (Signed)
CSW received SNF consult for patient. Talked with patient's significant other and daughter-in-law at the bedside and they are in agreement with SNF for short-term rehab (full assessment to follow). CSW will initiate bed search 2/27.  Kalianne Fetting Givens, MSW, LCSW Licensed Clinical Social Worker [place] [phone]

## 2016-03-09 DIAGNOSIS — E46 Unspecified protein-calorie malnutrition: Secondary | ICD-10-CM | POA: Diagnosis not present

## 2016-03-09 DIAGNOSIS — W19XXXD Unspecified fall, subsequent encounter: Secondary | ICD-10-CM | POA: Diagnosis not present

## 2016-03-09 DIAGNOSIS — F0391 Unspecified dementia with behavioral disturbance: Secondary | ICD-10-CM | POA: Diagnosis not present

## 2016-03-09 DIAGNOSIS — R1311 Dysphagia, oral phase: Secondary | ICD-10-CM | POA: Diagnosis not present

## 2016-03-09 DIAGNOSIS — R609 Edema, unspecified: Secondary | ICD-10-CM | POA: Diagnosis not present

## 2016-03-09 DIAGNOSIS — N189 Chronic kidney disease, unspecified: Secondary | ICD-10-CM | POA: Diagnosis not present

## 2016-03-09 DIAGNOSIS — E785 Hyperlipidemia, unspecified: Secondary | ICD-10-CM | POA: Diagnosis not present

## 2016-03-09 DIAGNOSIS — R748 Abnormal levels of other serum enzymes: Secondary | ICD-10-CM | POA: Diagnosis not present

## 2016-03-09 DIAGNOSIS — K8050 Calculus of bile duct without cholangitis or cholecystitis without obstruction: Secondary | ICD-10-CM | POA: Diagnosis not present

## 2016-03-09 DIAGNOSIS — I10 Essential (primary) hypertension: Secondary | ICD-10-CM | POA: Diagnosis not present

## 2016-03-09 DIAGNOSIS — K831 Obstruction of bile duct: Secondary | ICD-10-CM | POA: Diagnosis not present

## 2016-03-09 DIAGNOSIS — E876 Hypokalemia: Secondary | ICD-10-CM | POA: Diagnosis not present

## 2016-03-09 DIAGNOSIS — K8051 Calculus of bile duct without cholangitis or cholecystitis with obstruction: Secondary | ICD-10-CM | POA: Diagnosis not present

## 2016-03-09 DIAGNOSIS — E039 Hypothyroidism, unspecified: Secondary | ICD-10-CM | POA: Diagnosis not present

## 2016-03-09 DIAGNOSIS — R4182 Altered mental status, unspecified: Secondary | ICD-10-CM | POA: Diagnosis not present

## 2016-03-09 DIAGNOSIS — R1084 Generalized abdominal pain: Secondary | ICD-10-CM | POA: Diagnosis not present

## 2016-03-09 DIAGNOSIS — D631 Anemia in chronic kidney disease: Secondary | ICD-10-CM | POA: Diagnosis not present

## 2016-03-09 DIAGNOSIS — F0390 Unspecified dementia without behavioral disturbance: Secondary | ICD-10-CM | POA: Diagnosis not present

## 2016-03-09 DIAGNOSIS — N183 Chronic kidney disease, stage 3 (moderate): Secondary | ICD-10-CM | POA: Diagnosis not present

## 2016-03-09 DIAGNOSIS — R7989 Other specified abnormal findings of blood chemistry: Secondary | ICD-10-CM | POA: Diagnosis not present

## 2016-03-09 DIAGNOSIS — D696 Thrombocytopenia, unspecified: Secondary | ICD-10-CM | POA: Diagnosis not present

## 2016-03-09 DIAGNOSIS — R278 Other lack of coordination: Secondary | ICD-10-CM | POA: Diagnosis not present

## 2016-03-09 DIAGNOSIS — I2510 Atherosclerotic heart disease of native coronary artery without angina pectoris: Secondary | ICD-10-CM | POA: Diagnosis not present

## 2016-03-09 DIAGNOSIS — K219 Gastro-esophageal reflux disease without esophagitis: Secondary | ICD-10-CM | POA: Diagnosis not present

## 2016-03-09 DIAGNOSIS — R262 Difficulty in walking, not elsewhere classified: Secondary | ICD-10-CM | POA: Diagnosis not present

## 2016-03-09 LAB — COMPREHENSIVE METABOLIC PANEL
ALK PHOS: 198 U/L — AB (ref 38–126)
ALT: 85 U/L — AB (ref 17–63)
AST: 77 U/L — ABNORMAL HIGH (ref 15–41)
Albumin: 2.4 g/dL — ABNORMAL LOW (ref 3.5–5.0)
Anion gap: 7 (ref 5–15)
BUN: 16 mg/dL (ref 6–20)
CALCIUM: 8.3 mg/dL — AB (ref 8.9–10.3)
CO2: 24 mmol/L (ref 22–32)
Chloride: 110 mmol/L (ref 101–111)
Creatinine, Ser: 0.99 mg/dL (ref 0.61–1.24)
Glucose, Bld: 117 mg/dL — ABNORMAL HIGH (ref 65–99)
Potassium: 4.2 mmol/L (ref 3.5–5.1)
Sodium: 141 mmol/L (ref 135–145)
Total Bilirubin: 2.1 mg/dL — ABNORMAL HIGH (ref 0.3–1.2)
Total Protein: 5.7 g/dL — ABNORMAL LOW (ref 6.5–8.1)

## 2016-03-09 LAB — CBC
HCT: 26.4 % — ABNORMAL LOW (ref 39.0–52.0)
Hemoglobin: 8.9 g/dL — ABNORMAL LOW (ref 13.0–17.0)
MCH: 30.7 pg (ref 26.0–34.0)
MCHC: 33.7 g/dL (ref 30.0–36.0)
MCV: 91 fL (ref 78.0–100.0)
Platelets: 129 10*3/uL — ABNORMAL LOW (ref 150–400)
RBC: 2.9 MIL/uL — ABNORMAL LOW (ref 4.22–5.81)
RDW: 13.6 % (ref 11.5–15.5)
WBC: 6.4 10*3/uL (ref 4.0–10.5)

## 2016-03-09 MED ORDER — AMOXICILLIN-POT CLAVULANATE 875-125 MG PO TABS: 1.0000 | ORAL_TABLET | Freq: Two times a day (BID) | ORAL | Status: AC

## 2016-03-09 MED ORDER — AMOXICILLIN-POT CLAVULANATE 875-125 MG PO TABS
1.0000 | ORAL_TABLET | Freq: Two times a day (BID) | ORAL | Status: DC
  Administered 2016-03-09: 1 via ORAL
  Filled 2016-03-09: qty 1

## 2016-03-09 MED ORDER — AMOXICILLIN-POT CLAVULANATE 875-125 MG PO TABS: 1.0000 | ORAL_TABLET | Freq: Two times a day (BID) | ORAL | Status: DC

## 2016-03-09 MED ORDER — AMLODIPINE BESYLATE 2.5 MG PO TABS: 2.5000 mg | ORAL_TABLET | Freq: Every day | ORAL | Status: DC

## 2016-03-09 MED ORDER — HALOPERIDOL LACTATE 5 MG/ML IJ SOLN: 1.0000 mg | Freq: Three times a day (TID) | INTRAMUSCULAR | Status: DC | PRN

## 2016-03-09 MED ORDER — ATENOLOL 50 MG PO TABS: 50.0000 mg | ORAL_TABLET | Freq: Every day | ORAL | Status: DC

## 2016-03-09 MED ORDER — BENZONATATE 200 MG PO CAPS: 200.0000 mg | ORAL_CAPSULE | Freq: Three times a day (TID) | ORAL | 0 refills | Status: DC

## 2016-03-09 MED ORDER — ACETAMINOPHEN 325 MG PO TABS: 650.0000 mg | ORAL_TABLET | Freq: Four times a day (QID) | ORAL | Status: DC | PRN

## 2016-03-09 MED ORDER — RISPERIDONE 0.5 MG PO TABS: 0.5000 mg | ORAL_TABLET | Freq: Two times a day (BID) | ORAL | 0 refills | Status: DC

## 2016-03-09 NOTE — Clinical Social Work Placement (Addendum)
   CLINICAL SOCIAL WORK PLACEMENT  NOTE 03/09/16 - DISCHARGED TO [address] VIA AMBULANCE   Date:  03/09/2016  Patient Details  Name: BALDEEP MATTHEIS MRN: [id number] Date of Birth: July 05, 1926  Clinical Social Work is seeking post-discharge placement for this patient at the [place] level of care (*CSW will initial, date and re-position this form in  chart as items are completed):  Yes   Patient/family provided with [place] Work Department's list of facilities offering this level of care within the geographic area requested by the patient (or if unable, by the patient's family).  Yes   Patient/family informed of their freedom to choose among providers that offer the needed level of care, that participate in Medicare, Medicaid or managed care program needed by the patient, have an available bed and are willing to accept the patient.  Yes   Patient/family informed of [facility]'s ownership interest in [facility] and [facility], as well as of the fact that they are under no obligation to receive care at these facilities.  PASRR submitted to EDS on 03/09/16     PASRR number received on 03/09/16 ([id number] - effective 03/09/16)     Existing PASRR number confirmed on       FL2 transmitted to all facilities in geographic area requested by pt/family on 03/09/16 ([place] counties)     FL2 transmitted to all facilities within larger geographic area on       Patient informed that his/her managed care company has contracts with or will negotiate with certain facilities, including the following:         02/29/16 - Patient/family informed of bed offers received.  Patient chooses bed at  [facility] recommends and patient chooses bed at      Patient to be transferred to  [facility] on  03/09/16.  Patient to be transferred to facility by  ambulance     Patient family notified on  03/09/16 of  transfer.  Name of family member notified:   [name], significant other, at the bedside.    PHYSICIAN       Additional Comment:    _______________________________________________ Sable Feil, LCSW 03/09/2016, 12:41 PM

## 2016-03-09 NOTE — Discharge Summary (Signed)
Physician Discharge Summary  Raymond [name]  [id number]  DOB: 09-08-1926  DOA: 03/04/2016 PCP: Wende Neighbors, MD  Admit date: 03/04/2016 Discharge date: 03/09/2016  Admitted From: Home  Disposition:  SNF  Recommendations for Outpatient Follow-up:  1. Follow up with PCP in 1-2 weeks 2. Please obtain BMP/CBC in one week to monitor K and CBC 3. Need follow up with GI Dr Loletha Carrow [facility]   Discharge Condition: Improved   CODE STATUS: FULL  Diet recommendation: Heart Healthy / Carb Modified / Regular / Dysphagia   Brief/Interim Summary: Raymond [name] a 83 y.o.malewith multiple complicated medical history including severe dementia, chronic renal insufficiency, dysphagia, GERD, CAD status post MI, thrombocytopenia, ulcerative proctitis who presented to [facility] with epigastric abdominal pain and nausea associated with elevated LFT's and 12 mm CBD stone with 14 mm CBD dilatation.He also had elevated troponins without acute EKG changes or chest pain and is felt to be demand ischemia related due to his acute illness.  He was transferred to [facility] on 03/05/2016 for further management of choledocholithiasis, due to need of ERCP. ERCP was done, but stone wasn't able to be removed. Patient was continue on antibiotics. GI recommending to repeat procedure in 2 -3 weeks. Patient will be discharge to SNF and outpatient follow up   Subjective: Patient seen and examined at bedside. Was agitated overnight received haldol, now doing well. Patient awake and responsive, denies pain.   Discharge Diagnoses/Hospital Course:  Choledocholithiasis: s/p ERCP - large stone could not be removed, stent placed.  Initially treated with Zosyn for 5 days - cont antibiotics for 5 more day -switched to oral. Augmentin  LFTs slight up today - Repeat in 1 week  Need GI follow up to repeat ERCP in few weeks  Continue pain management with Tylenol PRN   Hypokalemia: - resolved  Repleted   Monitor BMP in 1 week   Elevated Troponin - asymptomatic  No obvious evidence of acute changes on EKG No chest pain Likely demand ischemia related to acute illness No further cardiac workup needed at this time   Hypertension: BP fluctuating ? Agitation vs pain - now improved  Patient with history of hypertension Had been on atenolol but was discontinued due to orthostasis Atenolol was resumed on admission - BP continues to be unstable  Continue Norvasc and atenolol Monitor BP weekly   Thrombocytopenia: Chronic  Follow clinically  Severe dementia - deconditioning likely natural course of the diseases, Mentally at baseline per family  High risk of sundowning  Continue Risperdal 0.5 mg BID  Avoid ativan as can increase delirium  Keep room bright  Haldol PRN agitation   Anemia of chronic diseases - drop in hemoglobin due hemodilution from continues IVF  No signs of active bleeding  Check CBC in 1 week   All other chronic medical condition were stable during the hospitalization.  Patient was seen by physical therapy and recommended SNF  On the day of the discharge the patient's vitals were stable, and no other acute medical condition were reported by patient. the patient was felt safe to be discharge to SNF  Discharge Instructions  You were cared for by a hospitalist during your hospital stay. If you have any questions about your discharge medications or the care you received while you were in the hospital after you are discharged, you can call the unit and asked to speak with the hospitalist on call if the hospitalist that took care of you is  not available. Once you are discharged, your primary care physician will handle any further medical issues. Please note that NO REFILLS for any discharge medications will be authorized once you are discharged, as it is imperative that you return to your primary care physician (or establish a relationship with a primary care physician if you  do not have one) for your aftercare needs so that they can reassess your need for medications and monitor your lab values.  Discharge Instructions    Call MD for:  difficulty breathing, headache or visual disturbances    Complete by:  As directed    Call MD for:  extreme fatigue    Complete by:  As directed    Call MD for:  hives    Complete by:  As directed    Call MD for:  persistant dizziness or light-headedness    Complete by:  As directed    Call MD for:  persistant nausea and vomiting    Complete by:  As directed    Call MD for:  redness, tenderness, or signs of infection (pain, swelling, redness, odor or green/yellow discharge around incision site)    Complete by:  As directed    Call MD for:  severe uncontrolled pain    Complete by:  As directed    Call MD for:  temperature >100.4    Complete by:  As directed    Diet - low sodium heart healthy    Complete by:  As directed    Increase activity slowly    Complete by:  As directed      Allergies as of 03/09/2016   No Known Allergies     Medication List    TAKE these medications   acetaminophen 325 MG tablet Commonly known as:  TYLENOL Take 2 tablets (650 mg total) by mouth every 6 (six) hours as needed for moderate pain. What changed:  medication strength  how much to take   amLODipine 2.5 MG tablet Commonly known as:  NORVASC Take 1 tablet (2.5 mg total) by mouth daily. Start taking on:  03/10/2016   amoxicillin-clavulanate 875-125 MG tablet Commonly known as:  AUGMENTIN Take 1 tablet by mouth every 12 (twelve) hours.   aspirin 81 MG tablet Take 81 mg by mouth daily.   atenolol 50 MG tablet Commonly known as:  TENORMIN Take 1 tablet (50 mg total) by mouth daily. Start taking on:  03/10/2016   atorvastatin 20 MG tablet Commonly known as:  LIPITOR TAKE ONE-HALF TABLET BY MOUTH ONCE DAILY   benzonatate 200 MG capsule Commonly known as:  TESSALON Take 1 capsule (200 mg total) by mouth 3 (three) times  daily.   donepezil 10 MG tablet Commonly known as:  ARICEPT TAKE ONE TABLET BY MOUTH ONCE DAILY AT BEDTIME   haloperidol lactate 5 MG/ML injection Commonly known as:  HALDOL Inject 0.2 mLs (1 mg total) into the vein every 8 (eight) hours as needed.   levothyroxine 112 MCG tablet Commonly known as:  SYNTHROID, LEVOTHROID TAKE ONE TABLET BY MOUTH ONCE DAILY BEFORE BREAKFAST   niacin 500 MG CR capsule Take 2 capsules (1,000 mg total) by mouth daily.   nitroGLYCERIN 0.4 MG SL tablet Commonly known as:  NITROSTAT Place 1 tablet (0.4 mg total) under the tongue every 5 (five) minutes as needed for chest pain.   ondansetron 4 MG disintegrating tablet Commonly known as:  ZOFRAN-ODT Take 1 tablet by mouth daily as needed for nausea/vomiting.   pantoprazole 40 MG tablet  Commonly known as:  PROTONIX TAKE ONE TABLET BY MOUTH ONCE DAILY BEFORE BREAKFAST   risperiDONE 0.5 MG tablet Commonly known as:  RISPERDAL Take 1 tablet (0.5 mg total) by mouth 2 (two) times daily.      Follow-up Information    [name], MD Follow up on 03/22/2016.   Specialty:  Gastroenterology Why:  10:45 visit with GI Dr Loletha Carrow.  bring all meds and insurance cards to visit.  Contact information: [place] [address] [phone]        Wende Neighbors, MD. Schedule an appointment as soon as possible for a visit in 1 week(s).   Specialty:  Internal Medicine Contact information: [address] [place] [address] [phone]          No Known Allergies  Consultations:  GI - [place]   Procedures/Studies: Ct Abdomen Pelvis W Contrast  Result Date: 03/04/2016 CLINICAL DATA:  83 y/o  M; epigastric pain. EXAM: CT ABDOMEN AND PELVIS WITH CONTRAST TECHNIQUE: Multidetector CT imaging of the abdomen and pelvis was performed using the standard protocol following bolus administration of intravenous contrast. CONTRAST:  59mL ISOVUE-300 IOPAMIDOL (ISOVUE-300) INJECTION 61%,  163mL ISOVUE-300 IOPAMIDOL (ISOVUE-300) INJECTION 61% COMPARISON:  None. FINDINGS: Lower chest: Severe aortic valvular and coronary artery calcifications. Hepatobiliary: No focal liver lesion. Several gallstones layer dependently within the gallbladder. No gallbladder wall thickening. Moderate intra hepatic biliary ductal dilatation and enlargement of common bile duct up to 14 mm. Enhancing mass within the lower common bile duct at the pancreatic head measuring 10 x 12 mm (AP by ML series 2, image 28.) Pancreas: Unremarkable. No pancreatic ductal dilatation or surrounding inflammatory changes. Spleen: Normal in size without focal abnormality. Adrenals/Urinary Tract: Multiple simple appearing renal cysts in the left kidney measuring up to 45 mm. Right kidney interpolar nonobstructing stone measuring 5 mm. No hydronephrosis. Normal bladder. Normal adrenal glands. Stomach/Bowel: Fluid and air-filled structures arising from the third segment of duodenum are compatible with duodenum diverticulum measuring 31 x 39 mm on the right and 31 x 24 mm on the left (series 5 image 38 and 39). No obstructive or inflammatory changes of the bowel. Prior partial colectomy with patent anastomosis in the lower mid abdomen. Sigmoid diverticulosis without evidence for diverticulitis. The appendix is not identified. Moderate volume of stool throughout the colon. Vascular/Lymphatic: Aortic atherosclerosis. Enlarged periportal and retropancreatic lymph nodes (series 2, image 22 and 23). Reproductive: Prostate calcification. Other: Small paraumbilical hernia containing fat.  No ascites. Musculoskeletal: No acute osseous abnormality. Mild multilevel degenerative changes of the spine greatest at the L4-5 level. IMPRESSION: 1. Moderate intra and extrahepatic biliary ductal dilatation with enhancing mass in the lower common bile duct measuring up to 12 mm. 2. Enlarged periportal and retropancreatic lymph nodes may be reactive or metastatic. 3.  Multiple gallstones.  No secondary signs of acute cholecystitis. 4. Right kidney interpolar nonobstructing stone. 5. Two duodenum diverticulum. 6. Mild diverticulosis of residual sigmoid colon. Patent colo-colonic anastomosis. 7. Severe aortic atherosclerosis. 8. Severe aortic valvular and coronary artery calcification. Electronically Signed   By: Kristine Garbe M.D.   On: 03/04/2016 22:18   Dg Chest Portable 1 View  Result Date: 03/03/2016 CLINICAL DATA:  83 y/o M; mid lower chest pain and shortness of breath. EXAM: PORTABLE CHEST 1 VIEW COMPARISON:  12/12/2014 chest radiograph. FINDINGS: Stable heart size and mediastinal contours are within normal limits. Aortic atherosclerosis with calcification. Stable biapical mild pleuroparenchymal scarring. Both lungs are clear.  The visualized skeletal structures are unremarkable. IMPRESSION: No active disease. Electronically Signed   By: Kristine Garbe M.D.   On: 03/03/2016 21:08   Dg Ercp Biliary & Pancreatic Ducts  Result Date: 03/07/2016 CLINICAL DATA:  Obstructive jaundice, suspect choledocholithiasis EXAM: ERCP stone removal with balloon extraction.  Biliary sphincterotomy. TECHNIQUE: Multiple spot images obtained with the fluoroscopic device and submitted for interpretation post-procedure. FLUOROSCOPY TIME:  Fluoroscopy Time:  8 minutes 51 seconds COMPARISON:  03/04/2016 FINDINGS: Limited imaging during the ERCP performed. This demonstrates diffuse intra and extrahepatic biliary dilatation. Filling defects in the common bile duct compatible with choledocholithiasis. Balloon sweep performed. At the conclusion, endoscopic temporary biliary stent inserted. IMPRESSION: Choledocholithiasis with biliary dilatation. ERCP with sphincterotomy, balloon sweep, and stent placement. These images were submitted for radiologic interpretation only. Please see the procedural report for the amount of contrast and the fluoroscopy time utilized. Electronically  Signed   By: Jerilynn Mages.  Shick M.D.   On: 03/07/2016 11:34   ERCP 03/07/16 Impression:  - Benign-appearing esophageal stenosis. - The major papilla appeared normal. - The entire main bile duct was dilated. -Choledocholithiasis was found. Removal by balloon extraction was not accomplished; a stent was inserted. - A biliary sphincterotomy was performed. - The biliary tree was swept and sludge was found. - One plastic stent was placed into the common bile duct.   Recommendation:  - Resume regular diet. - No anticoagulation for DVT prophylaxis. (use SCDs) - Avoid aspirin and nonsteroidal anti-inflammatory medicines for 2 weeks. - Repeat ERCP few weeks for stent removal and stone extraction with possible cholangioscope.  Discharge Exam: Vitals:   03/09/16 0612 03/09/16 0845  BP: (!) 155/75 (!) 143/84  Pulse: 69 73  Resp:  17  Temp:  97.8 F (36.6 C)   Vitals:   03/08/16 2337 03/09/16 0517 03/09/16 0612 03/09/16 0845  BP:  (!) 181/75 (!) 155/75 (!) 143/84  Pulse:  75 69 73  Resp:  18  17  Temp:  98.8 F (37.1 C)  97.8 F (36.6 C)  TempSrc:  Oral  Oral  SpO2:  99%  99%  Weight: 66.1 kg (145 lb 11.6 oz)     Height:        General: Pt is alert, awake, not in acute distress Cardiovascular: RRR, S1/S2 +, no rubs, no gallops Respiratory: CTA bilaterally, no wheezing, no rhonchi Abdominal: Soft, NT, ND, bowel sounds + Extremities: no edema, no cyanosis   The results of significant diagnostics from this hospitalization (including imaging, microbiology, ancillary and laboratory) are listed below for reference.     Microbiology: No results found for this or any previous visit (from the past 240 hour(s)).   Labs: BNP (last 3 results) No results for input(s): BNP in the last 8760 hours. Basic Metabolic Panel:  Recent Labs Lab 03/05/16 0531 03/06/16 0510 03/07/16 0724 03/08/16 0838 03/09/16 0604  NA 138 141 139 140 141  K 3.1* 3.9 3.2* 3.4* 4.2  CL  104 105 104 110 110  CO2 27 26 25 24 24   GLUCOSE 112* 83 128* 112* 117*  BUN 14 11 6 8 16   CREATININE 0.97 0.98 0.94 0.94 0.99  CALCIUM 8.1* 9.0 8.6* 7.8* 8.3*   Liver Function Tests:  Recent Labs Lab 03/05/16 0531 03/06/16 0510 03/07/16 0724 03/08/16 0838 03/09/16 0604  AST 192* 111* 62* 44* 77*  ALT 189* 152* 112* 74* 85*  ALKPHOS 226* 211* 186* 140* 198*  BILITOT 4.8* 2.5* 1.7* 1.7* 2.1*  PROT 6.1* 6.4* 6.2*  5.2* 5.7*  ALBUMIN 3.0* 3.0* 2.8* 2.4* 2.4*    Recent Labs Lab 03/03/16 2100 03/04/16 1937  LIPASE 21 15   No results for input(s): AMMONIA in the last 168 hours. CBC:  Recent Labs Lab 03/04/16 1937 03/05/16 0531 03/06/16 0510 03/08/16 0838 03/09/16 0604  WBC 7.6 6.7 5.4 4.5 6.4  NEUTROABS 6.2  --   --  3.4  --   HGB 13.0 10.5* 11.5* 9.4* 8.9*  HCT 38.1* 30.1* 35.2* 28.3* 26.4*  MCV 91.4 92.0 92.1 91.9 91.0  PLT 129* 99* 122* 131* 129*   Cardiac Enzymes:  Recent Labs Lab 03/03/16 2100 03/04/16 1937 03/05/16 0531  TROPONINI <0.03 0.03* 0.07*   BNP: Invalid input(s): POCBNP CBG: No results for input(s): GLUCAP in the last 168 hours. D-Dimer No results for input(s): DDIMER in the last 72 hours. Hgb A1c No results for input(s): HGBA1C in the last 72 hours. Lipid Profile No results for input(s): CHOL, HDL, LDLCALC, TRIG, CHOLHDL, LDLDIRECT in the last 72 hours. Thyroid function studies No results for input(s): TSH, T4TOTAL, T3FREE, THYROIDAB in the last 72 hours.  Invalid input(s): FREET3 Anemia work up No results for input(s): VITAMINB12, FOLATE, FERRITIN, TIBC, IRON, RETICCTPCT in the last 72 hours. Urinalysis No results found for: COLORURINE, APPEARANCEUR, LABSPEC, [place], GLUCOSEU, HGBUR, BILIRUBINUR, KETONESUR, PROTEINUR, UROBILINOGEN, NITRITE, LEUKOCYTESUR Sepsis Labs Invalid input(s): PROCALCITONIN,  WBC,  LACTICIDVEN Microbiology No results found for this or any previous visit (from the past 240 hour(s)).   Time coordinating  discharge: Over 35 minutes  SIGNED:  Chipper Oman, MD  [facility] 03/09/2016, 4:10 PM  Pager please text page via  [email or web address] Password [email or web address]

## 2016-03-09 NOTE — Progress Notes (Signed)
Raymond [name] to be D/C'd Skilled nursing facility per MD order.  Discussed prescriptions and follow up appointments with the patient. Prescriptions given to patient, medication list explained in detail. Pt verbalized understanding.  Allergies as of 03/09/2016   No Known Allergies     Medication List    TAKE these medications   acetaminophen 325 MG tablet Commonly known as:  TYLENOL Take 2 tablets (650 mg total) by mouth every 6 (six) hours as needed for moderate pain. What changed:  medication strength  how much to take   amLODipine 2.5 MG tablet Commonly known as:  NORVASC Take 1 tablet (2.5 mg total) by mouth daily. Start taking on:  03/10/2016   amoxicillin-clavulanate 875-125 MG tablet Commonly known as:  AUGMENTIN Take 1 tablet by mouth every 12 (twelve) hours.   aspirin 81 MG tablet Take 81 mg by mouth daily.   atenolol 50 MG tablet Commonly known as:  TENORMIN Take 1 tablet (50 mg total) by mouth daily. Start taking on:  03/10/2016   atorvastatin 20 MG tablet Commonly known as:  LIPITOR TAKE ONE-HALF TABLET BY MOUTH ONCE DAILY   benzonatate 200 MG capsule Commonly known as:  TESSALON Take 1 capsule (200 mg total) by mouth 3 (three) times daily.   donepezil 10 MG tablet Commonly known as:  ARICEPT TAKE ONE TABLET BY MOUTH ONCE DAILY AT BEDTIME   haloperidol lactate 5 MG/ML injection Commonly known as:  HALDOL Inject 0.2 mLs (1 mg total) into the vein every 8 (eight) hours as needed.   levothyroxine 112 MCG tablet Commonly known as:  SYNTHROID, LEVOTHROID TAKE ONE TABLET BY MOUTH ONCE DAILY BEFORE BREAKFAST   niacin 500 MG CR capsule Take 2 capsules (1,000 mg total) by mouth daily.   nitroGLYCERIN 0.4 MG SL tablet Commonly known as:  NITROSTAT Place 1 tablet (0.4 mg total) under the tongue every 5 (five) minutes as needed for chest pain.   ondansetron 4 MG disintegrating tablet Commonly known as:  ZOFRAN-ODT Take 1 tablet by mouth daily as needed  for nausea/vomiting.   pantoprazole 40 MG tablet Commonly known as:  PROTONIX TAKE ONE TABLET BY MOUTH ONCE DAILY BEFORE BREAKFAST   risperiDONE 0.5 MG tablet Commonly known as:  RISPERDAL Take 1 tablet (0.5 mg total) by mouth 2 (two) times daily.       Vitals:   03/09/16 0612 03/09/16 0845  BP: (!) 155/75 (!) 143/84  Pulse: 69 73  Resp:  17  Temp:  97.8 F (36.6 C)    Skin clean, dry and intact without evidence of skin break down, no evidence of skin tears noted. IV catheter discontinued intact. Site without signs and symptoms of complications. Dressing and pressure applied. Pt denies pain at this time. No complaints noted. Report given all questions answered. An After Visit Summary was printed and given to the patient. Patient escorted via stretcher, and D/C home via ambulance.  [name] BSN, RN

## 2016-03-09 NOTE — NC FL2 (Signed)
[place] LEVEL OF CARE SCREENING TOOL     IDENTIFICATION  Patient Name: Raymond [name] Birthdate: 20-Sep-1926 Sex: male Admission Date (Current Location): 03/04/2016  [facility] and [place] Number:  [employer] and Address:  [facility], [place] [address] [place], [place] [address]      Provider Number: [id number]  Attending Physician Name and Address:  Doreatha Lew, MD  Relative Name and Phone Number:  [place],Lula-Significant other; [phone] (home), [phone] (mobile);  [place], [place]; [phone] (home), [phone] (mobile)     Current Level of Care: Hospital Recommended Level of Care: [place] Prior Approval Number:    Date Approved/Denied:   PASRR Number: [id number] (Eff. 03/09/16)  Discharge Plan: SNF    Current Diagnoses: Patient Active Problem List   Diagnosis Date Noted  . Hypokalemia 03/05/2016  . Elevated troponin 03/05/2016  . Thrombocytopenia ([place]) 03/05/2016  . Pressure injury of skin 03/05/2016  . Common bile duct (CBD) obstruction   . Jaundice   . Choledocholithiasis 03/04/2016  . Abnormal LFTs 03/04/2016  . Hyperbilirubinemia 03/04/2016  . Common bile duct dilation 03/04/2016  . Acute epigastric pain 03/04/2016  . GERD (gastroesophageal reflux disease) 10/04/2014  . Fatigue 09/09/2014  . Low blood pressure 05/03/2013  . Dementia 04/16/2013  . Hyperlipidemia 04/16/2013  . Esophageal dysphagia 06/23/2012  . Other and unspecified noninfectious gastroenteritis and colitis(558.9) 06/23/2012  . Unspecified essential hypertension 04/08/2012  . Heart attack   . Hypothyroidism 10/15/2009  . Psoriasis 10/15/2009  . HYPERLIPIDEMIA 09/11/2009  . PAD (peripheral artery disease) ([place]) 09/11/2009  . Esophageal stricture 09/11/2009  . DIVERTICULAR DISEASE 09/11/2009    Orientation RESPIRATION BLADDER Height & Weight     Self  Normal Incontinent (External catheter placed  2/22) Weight: 145 lb 11.6 oz (66.1 kg) Height:  5\' 11"  (180.3 cm)  BEHAVIORAL SYMPTOMS/MOOD NEUROLOGICAL BOWEL NUTRITION STATUS      Continent Diet (Regular)  AMBULATORY STATUS COMMUNICATION OF NEEDS Skin   Extensive Assist Verbally Other (Comment) (Moisture associated skin damage to right groin, treated with barrier cream; rash right/left arm and hand; Stage 2 pressure ulcer to buttocks)                       Personal Care Assistance Level of Assistance  Bathing, Feeding, Dressing Bathing Assistance: Maximum assistance Feeding assistance: Maximum assistance Dressing Assistance: Maximum assistance     Functional Limitations Info  Sight, Hearing, Speech Sight Info: Adequate Hearing Info: Adequate Speech Info: Impaired    SPECIAL CARE FACTORS FREQUENCY  PT (By licensed PT)     PT Frequency: Evaluated 2/26 and a minimum of 3X per week therapy recommended              Contractures Contractures Info: Not present    Additional Factors Info  Code Status, Allergies Code Status Info: Full Allergies Info: No known allergies           Current Medications (03/09/2016):  This is the current hospital active medication list Current Facility-Administered Medications  Medication Dose Route Frequency Provider Last Rate Last Dose  . acetaminophen (TYLENOL) tablet 1,000 mg  1,000 mg Oral Q6H PRN Doreatha Lew, MD      . [facility]) tablet 2.5 mg  2.5 mg Oral Daily Doreatha Lew, MD   2.5 mg at 03/08/16 2237  . amoxicillin-clavulanate (AUGMENTIN) 875-125 MG per tablet 1 tablet  1 tablet Oral Q12H Doreatha Lew, MD      .  atenolol (TENORMIN) tablet 50 mg  50 mg Oral Daily Benito Mccreedy, MD   50 mg at 03/08/16 1117  . benzonatate (TESSALON) capsule 200 mg  200 mg Oral TID Doreatha Lew, MD   200 mg at 03/08/16 2238  . dextrose 5 %-0.9 % sodium chloride infusion   Intravenous Continuous Doreatha Lew, MD 50 mL/hr at 03/08/16 1355    .  guaiFENesin-dextromethorphan (ROBITUSSIN DM) 100-10 MG/5ML syrup 5 mL  5 mL Oral Q4H PRN Tanna Savoy Stinson, DO   5 mL at 03/08/16 0054  . haloperidol lactate (HALDOL) injection 1 mg  1 mg Intravenous Q6H PRN Doreatha Lew, MD   1 mg at 03/09/16 0456  . indomethacin (INDOCIN) 50 MG suppository 100 mg  100 mg Rectal Once Jessica D Zehr, PA-C      . ondansetron (ZOFRAN) tablet 4 mg  4 mg Oral Q6H PRN Phillips Grout, MD       Or  . ondansetron (ZOFRAN) injection 4 mg  4 mg Intravenous Q6H PRN Phillips Grout, MD      . potassium chloride SA (K-DUR,KLOR-CON) CR tablet 20 mEq  20 mEq Oral BID Benito Mccreedy, MD   20 mEq at 03/08/16 2238  . risperiDONE (RISPERDAL) tablet 0.5 mg  0.5 mg Oral BID Doreatha Lew, MD   0.5 mg at 03/08/16 2238  . traMADol (ULTRAM) tablet 50 mg  50 mg Oral Q12H PRN Doreatha Lew, MD   50 mg at 03/08/16 1456     Discharge Medications: Please see discharge summary for a list of discharge medications.  Relevant Imaging Results:  Relevant Lab Results:   Additional Information [id number]  Sable Feil, LCSW

## 2016-03-09 NOTE — Consult Note (Signed)
           [facility] CM Primary Care Navigator  03/09/2016  BYRAN HOLDER [birth year] [id number]   Went to see patient at the bedside and he was constantly moving with hand mittens on.  He was able to state his name and birth date but otherwise, disoriented.   RN was getting ready to discharge patient to skilled nursing facility ([facility]) as stated. Will notify [facility] post acute RN to follow-up patient on discharge to SNF for short term rehabilitation.  For questions, please contact:  Dannielle Huh, BSN, RN- [facility] Primary Care Navigator  Telephone: [phone] [place]

## 2016-03-09 NOTE — Progress Notes (Deleted)
PROGRESS NOTE [facility]   HARTWELL BURKEEN   [id number] DOB: 04/25/1926  DOA: 03/04/2016 PCP: Wende Neighbors, MD   Brief Narrative:  Raymond [name] a 83 y.o.malewith multiple complicated medical history including severe dementia, chronic renal insufficiency, dysphagia, GERD, CAD status post MI, thrombocytopenia, ulcerative proctitis who presented to [facility] with epigastric abdominal pain and nausea associated with elevated LFT's and 12 mm CBD stone with 14 mm CBD dilatation.He also had elevated troponins without acute EKG changes or chest pain and is felt to be demand ischemia related due to his acute illness.  He was transferred to [facility] on 03/05/2016 for further management of choledocholithiasis, due to need of ERCP.  Subjective: Patient seen and examined at bedside. Was agitated overnight received haldol, now doing well. Patient awake and responsive, denies pain.   Assessment & Plan: Choledocholithiasis: s/p ERCP - large stone could not be removed, stent placed.  GI recommendations appreciated  Initially treated with Zosyn for 5 days - cont antibiotics for 5 more day -switched to oral. Augmentin  LFTs slight up today  Need GI follow up to repeat ERCP in few weeks   Hypokalemia: Replete K  Check Mg in AM  Monitor BMP   Elevated Troponin: No obvious evidence of acute changes on EKG No chest pain Likely demand ischemia related to acute illness No further cardiac workup needed at this time   Hypertension: BP fluctuating ? Agitation vs pain - now improved  Patient with history of hypertension Had been on atenolol but was discontinued due to orthostasis Atenolol was resumed on admission - BP continues to be unstable  Continue Norvasc and atenolol  Thrombocytopenia: Chronic  Follow clinically  Severe dementia - deconditioning likely natural course of the diseases High risk of sundowning  Continue Risperdal 0.5 mg BID  Avoid ativan as  can increase delirium  Keep room bright  Haldol PRN agitation  PT recommending SNF - awaiting for family decision  DVT prophylaxis: SCDs Code Status: FULL  Family Communication: Family at bedside  Disposition Plan: Patient awaiting for SNF bed. Cleared for discharge   Consultants:   GI    Procedures:   ERCP  Impression:               - - Benign-appearing esophageal stenosis.  - The major papilla appeared normal. - The entire main bile duct was dilated. -Choledocholithiasis was found. Removal by balloon extraction was not accomplished; a stent was inserted. - A biliary sphincterotomy was performed. - The biliary tree was swept and sludge was found. - One plastic stent was placed into the common bile duct.   Recommendation:            - Resume regular diet. - No anticoagulation for DVT prophylaxis. (use SCDs) - Avoid aspirin and nonsteroidal anti-inflammatory medicines for 2 weeks. - Repeat ERCP few weeks for stent removal and stone extraction with possible cholangioscope.  Antimicrobials:  Zosyn 03/05/16 - 03/08/16  Unasy 03/08/16   Objective: Vitals:   03/08/16 2337 03/09/16 0517 03/09/16 0612 03/09/16 0845  BP:  (!) 181/75 (!) 155/75 (!) 143/84  Pulse:  75 69 73  Resp:  18  17  Temp:  98.8 F (37.1 C)  97.8 F (36.6 C)  TempSrc:  Oral  Oral  SpO2:  99%  99%  Weight: 66.1 kg (145 lb 11.6 oz)     Height:        Intake/Output Summary (Last 24 hours) at 03/09/16 1538 Last  data filed at 03/09/16 1405  Gross per 24 hour  Intake             1350 ml  Output             1725 ml  Net             -375 ml   Filed Weights   03/06/16 2229 03/07/16 2012 03/08/16 2337  Weight: 64.9 kg (143 lb) 66.2 kg (146 lb) 66.1 kg (145 lb 11.6 oz)    Examination:  General exam: Alert and awake, comfortable   Respiratory system: Clear to auscultation. No wheezes,crackle or rhonchi Cardiovascular system: S1 & S2 heard, RRR. No JVD, murmurs, rubs or gallops Gastrointestinal  system: Soft, RUQ tenderness to palpation. + BS  Central nervous system: Not oriented to time or place  Extremities: Trace LE edema.  Skin: No rashes Psychiatry: Mood & affect dementia     Data Reviewed: I have personally reviewed following labs and imaging studies  CBC:  Recent Labs Lab 03/04/16 1937 03/05/16 0531 03/06/16 0510 03/08/16 0838 03/09/16 0604  WBC 7.6 6.7 5.4 4.5 6.4  NEUTROABS 6.2  --   --  3.4  --   HGB 13.0 10.5* 11.5* 9.4* 8.9*  HCT 38.1* 30.1* 35.2* 28.3* 26.4*  MCV 91.4 92.0 92.1 91.9 91.0  PLT 129* 99* 122* 131* [id number]*   Basic Metabolic Panel:  Recent Labs Lab 03/05/16 0531 03/06/16 0510 03/07/16 0724 03/08/16 0838 03/09/16 0604  NA 138 141 139 140 141  K 3.1* 3.9 3.2* 3.4* 4.2  CL 104 105 104 110 110  CO2 27 26 25 24 24   GLUCOSE 112* 83 128* 112* 117*  BUN 14 11 6 8 16   CREATININE 0.97 0.98 0.94 0.94 0.99  CALCIUM 8.1* 9.0 8.6* 7.8* 8.3*   GFR: Estimated Creatinine Clearance: 49.1 mL/min (by C-G formula based on SCr of 0.99 mg/dL). Liver Function Tests:  Recent Labs Lab 03/05/16 0531 03/06/16 0510 03/07/16 0724 03/08/16 0838 03/09/16 0604  AST 192* 111* 62* 44* 77*  ALT 189* 152* 112* 74* 85*  ALKPHOS 226* 211* 186* 140* 198*  BILITOT 4.8* 2.5* 1.7* 1.7* 2.1*  PROT 6.1* 6.4* 6.2* 5.2* 5.7*  ALBUMIN 3.0* 3.0* 2.8* 2.4* 2.4*    Recent Labs Lab 03/03/16 2100 03/04/16 1937  LIPASE 21 15   No results for input(s): AMMONIA in the last 168 hours. Coagulation Profile:  Recent Labs Lab 03/05/16 0531  INR 1.23   Cardiac Enzymes:  Recent Labs Lab 03/03/16 2100 03/04/16 1937 03/05/16 0531  TROPONINI <0.03 0.03* 0.07*   BNP (last 3 results) No results for input(s): PROBNP in the last 8760 hours. HbA1C: No results for input(s): HGBA1C in the last 72 hours. CBG: No results for input(s): GLUCAP in the last 168 hours. Lipid Profile: No results for input(s): CHOL, HDL, LDLCALC, TRIG, CHOLHDL, LDLDIRECT in the last 72  hours. Thyroid Function Tests: No results for input(s): TSH, T4TOTAL, FREET4, T3FREE, THYROIDAB in the last 72 hours. Anemia Panel: No results for input(s): VITAMINB12, FOLATE, FERRITIN, TIBC, IRON, RETICCTPCT in the last 72 hours. Sepsis Labs: No results for input(s): PROCALCITON, LATICACIDVEN in the last 168 hours.  No results found for this or any previous visit (from the past 240 hour(s)).     Radiology Studies: No results found.  Scheduled Meds: . amLODipine  2.5 mg Oral Daily  . amoxicillin-clavulanate  1 tablet Oral Q12H  . atenolol  50 mg Oral Daily  . benzonatate  200 mg  Oral TID  . indomethacin  100 mg Rectal Once  . potassium chloride  20 mEq Oral BID  . risperiDONE  0.5 mg Oral BID   Continuous Infusions: . dextrose 5 % and 0.9% NaCl 50 mL/hr at 03/08/16 1355     LOS: 5 days    Chipper Oman, MD Pager: Text Page via [email or web address]  [phone]  If 7PM-7AM, please contact night-coverage [email or web address] Password [facility] 03/09/2016, 3:38 PM

## 2016-03-09 NOTE — Progress Notes (Signed)
Late Entry 03/09/16 around 1200.  Spoke with the daughter of patients significant other. Stated that Significant other is the [facility] and has documentation of such. SW Crawford Givens made aware. Joua Bake, Bryn Gulling

## 2016-03-09 NOTE — Clinical Social Work Note (Signed)
Clinical Social Work Assessment  Patient Details  Name: Raymond [name] MRN: [id number] Date of Birth: 1926-07-26  Date of referral:  03/08/16               Reason for consult:  Facility Placement                Permission sought to share information with:  Family Supports Permission granted to share information::  No (Patient oriented to self only. Significant Other is HCPOA (paperwork provided))  Name::     Raymond [name] and Raymond [name], Raymond [name],   Agency::     Relationship::  Significant other (HCPOA) and son  Contact Information:  [phone]  Housing/Transportation Living arrangements for the past 2 months:  [place] (Patient and Ms. Raymond [name] live together) Source of Information:  Other (Comment Required) (CSW talked with daughter-in-law Raymond [name] and significant other) Patient Interpreter Needed:  None Criminal Activity/Legal Involvement Pertinent to Current Situation/Hospitalization:  No - Comment as needed Significant Relationships:  Other Family Members, Significant Other, Adult Children Lives with:  Significant Other Raymond [name]) Do you feel safe going back to the place where you live?  No (Ms. Raymond [name] and daughter-in-law in agreement that patient needs SNF placement before returning home.) Need for family participation in patient care:  Yes (Comment)  Care giving concerns: Ms. Raymond [name] and daughter concerned about patient returning home. Ms. Raymond [name] not sure that she can care for patient post-discharge.  Social Worker assessment / plan:  CSW talked with Ms. Raymond [name] and daughter-in-law Raymond [name] at the bedside regarding discharge disposition and recommendation of ST rehab. Both were in agreement and discharge options discussed. CSW explained the facility search process and provided SNF lists for [place] [facility]. Daughter-in-law wants patient in [facility] as she and son live in [place],  however patient and Ms. Raymond [name] live in [place], and it  was agreed that facility search will be done in both counties. CSW also advised family that patient would not have someone in the room 24/7 at a skilled facility, as he is currently a high fall risk.  Employment status:  Retired Forensic scientist:  Information systems manager, Manufacturing engineer) PT Recommendations:  [place] / Referral to community resources:  [place] (Ms. Raymond [name] and daughter-in-law Raymond [name] provided with facility lists on 2/26)  Patient/Family's Response to care: No concerns expressed regarding care during hospitalization.  Patient/Family's Understanding of and Emotional Response to Diagnosis, Current Treatment, and Prognosis:  Not discussed.  Emotional Assessment Appearance:  Appears stated age Attitude/Demeanor/Rapport:  Other (Quiet, confused) Affect (typically observed):  Quiet Orientation:  Oriented to Self Alcohol / Substance use:  Tobacco Use, Alcohol Use, Illicit Drugs (Patient reports that he quit smoking and does not drink or use illicit drugs) Psych involvement (Current and /or in the community):  No (Comment)  Discharge Needs  Concerns to be addressed:  Discharge Planning Concerns Readmission within the last 30 days:  No Current discharge risk:  None Barriers to Discharge:  Family Issues   Raymond Feil, LCSW 03/09/2016, 12:29 PM

## 2016-03-09 NOTE — Evaluation (Signed)
Occupational Therapy Evaluation Patient Details Name: Raymond [name] MRN: [id number] DOB: [birth year] Today's Date: 03/09/2016    History of Present Illness pt is an 83 y/o male with pmh of dementia, CKD, CAD admitted to ED x2 with abdominal pain and then more epigastric pain.  Work  up suggested Choledocholithiasis.  ERCP completed, but unable to remove stone, so stent placed.   Clinical Impression   PTA, pt lived with significant other who provided A with IADLs, bathing (sponge bathing), and grooming. Significant other reports that pt could complete dressing and self feeding with some set up. Currently, pt is Max-total A for ADLs and functional mobility. Pt would benefit from continued acute OT to increase occupational performance and participation. Recommend dc to SNF to increase pt's functional mobility and ADL performance.    Follow Up Recommendations  SNF;Supervision/Assistance - 24 hour    Equipment Recommendations  Other (comment) (Defer to next venue)    Recommendations for Other Services       Precautions / Restrictions Precautions Precautions: Fall Restrictions Weight Bearing Restrictions: No      Mobility Bed Mobility Overal bed mobility: Needs Assistance Bed Mobility: Supine to Sit     Supine to sit: Total assist;+2 for physical assistance;+2 for safety/equipment;HOB elevated        Transfers Overall transfer level: Needs assistance   Transfers: Sit to/from Stand Sit to Stand: Total assist;+2 physical assistance         General transfer comment: Sit to stand +2 A with stedy; pt requires sara stedy due to decreased postural control when standing    Balance Overall balance assessment: Needs assistance Sitting-balance support: Single extremity supported;Bilateral upper extremity supported Sitting balance-Leahy Scale: Poor Sitting balance - Comments: Lean to left when seated Postural control: Left lateral lean;Posterior lean Standing balance support:  Bilateral upper extremity supported Standing balance-Leahy Scale: Poor Standing balance comment: Stood x3; pt fearful and limited by decreased cognition                            ADL Overall ADL's : Needs assistance/impaired Eating/Feeding: Supervision/ safety;Set up Eating/Feeding Details (indicate cue type and reason): Per significant other's report; self feed with spoon Grooming: Wash/dry hands;Wash/dry face;Moderate assistance;Cueing for sequencing;Cueing for safety;Sitting (Max cues for initiation and sequencing)   Upper Body Bathing: Moderate assistance;Sitting   Lower Body Bathing: Maximal assistance;Sitting/lateral leans   Upper Body Dressing : Moderate assistance;Sitting   Lower Body Dressing: Maximal assistance;Sitting/lateral leans   Toilet Transfer: Maximal assistance;+2 for physical assistance;Cueing for safety;Cueing for sequencing Clarise Cruz stedy)   Toileting- Clothing Manipulation and Hygiene: Maximal assistance;Sitting/lateral lean       Functional mobility during ADLs: Total assistance;+2 for physical assistance;+2 for safety/equipment;Cueing for safety;Cueing for sequencing (sara stedy for transfer to chair) General ADL Comments: Pt appears to be near baseline for grooming and bathing with decline in functional mobility and dressing.      Vision         Perception     Praxis      Pertinent Vitals/Pain Pain Assessment: Faces Faces Pain Scale: Hurts little more Pain Location: vague Pain Descriptors / Indicators: Moaning Pain Intervention(s): Monitored during session     Hand Dominance Right   Extremity/Trunk Assessment Upper Extremity Assessment Upper Extremity Assessment: Difficult to assess due to impaired cognition   Lower Extremity Assessment Lower Extremity Assessment: Difficult to assess due to impaired cognition       Communication Communication Communication: Other (comment) (  Cognition: Dementia)   Cognition  Arousal/Alertness: Lethargic Behavior During Therapy: Agitated;Restless Overall Cognitive Status: Impaired/Different from baseline                 General Comments: unable to determine due to arousal   General Comments       Exercises       Shoulder Instructions      Home Living Family/patient expects to be discharged to:: Private residence Living Arrangements: Spouse/significant other Available Help at Discharge: Available 24 hours/day;Available PRN/intermittently Type of Home: House Home Access: Stairs to enter [employer] of Steps: 2-6 Entrance Stairs-Rails: Left;Right Home Layout: One level     Bathroom Shower/Tub: Tub/shower unit Shower/tub characteristics: Door Bathroom Toilet: Handicapped height     Home Equipment: Environmental consultant - 2 wheels          Prior Functioning/Environment Level of Independence: Needs assistance  Gait / Transfers Assistance Needed: significant other reports that he was mobile PTA ADL's / Homemaking Assistance Needed: Significant other A with ADLs; pt able to dress and self feed with set up            OT Problem List: Decreased strength;Decreased activity tolerance;Decreased coordination;Decreased cognition;Impaired balance (sitting and/or standing);Decreased safety awareness      OT Treatment/Interventions: Self-care/ADL training;Therapeutic exercise;Therapeutic activities;Cognitive remediation/compensation;Patient/family education    OT Goals(Current goals can be found in the care plan section) Acute Rehab OT Goals Patient Stated Goal: pt unable  OT Frequency: Min 2X/week   Barriers to D/C:            Co-evaluation              End of Session Equipment Utilized During Treatment: Gait belt;Other (comment) Charlaine Dalton) Nurse Communication: Mobility status  Activity Tolerance:  (Limited by cognition) Patient left: in chair;with call bell/phone within reach;with family/visitor present  OT Visit Diagnosis: Other  symptoms and signs involving cognitive function;Muscle weakness (generalized) (M62.81);Unsteadiness on feet (R26.81)                ADL either performed or assessed with clinical judgement  Time: [id number] OT Time Calculation (min): 42 min Charges:  OT General Charges $OT Visit: 1 Procedure OT Evaluation $OT Eval Moderate Complexity: 1 Procedure OT Treatments $Self Care/Home Management : 8-22 mins $Therapeutic Activity: 8-22 mins G-Codes:     [employer], OTR/L [place] 03/09/2016, 4:22 PM

## 2016-03-10 DIAGNOSIS — F0391 Unspecified dementia with behavioral disturbance: Secondary | ICD-10-CM | POA: Diagnosis not present

## 2016-03-10 DIAGNOSIS — K8051 Calculus of bile duct without cholangitis or cholecystitis with obstruction: Secondary | ICD-10-CM | POA: Diagnosis not present

## 2016-03-10 DIAGNOSIS — I2510 Atherosclerotic heart disease of native coronary artery without angina pectoris: Secondary | ICD-10-CM | POA: Diagnosis not present

## 2016-03-10 DIAGNOSIS — I10 Essential (primary) hypertension: Secondary | ICD-10-CM | POA: Diagnosis not present

## 2016-03-11 ENCOUNTER — Telehealth: Payer: Self-pay | Admitting: Gastroenterology

## 2016-03-11 DIAGNOSIS — N183 Chronic kidney disease, stage 3 (moderate): Secondary | ICD-10-CM | POA: Diagnosis not present

## 2016-03-11 DIAGNOSIS — E46 Unspecified protein-calorie malnutrition: Secondary | ICD-10-CM | POA: Diagnosis not present

## 2016-03-11 DIAGNOSIS — D696 Thrombocytopenia, unspecified: Secondary | ICD-10-CM | POA: Diagnosis not present

## 2016-03-11 DIAGNOSIS — F0391 Unspecified dementia with behavioral disturbance: Secondary | ICD-10-CM | POA: Diagnosis not present

## 2016-03-11 DIAGNOSIS — K8050 Calculus of bile duct without cholangitis or cholecystitis without obstruction: Secondary | ICD-10-CM | POA: Diagnosis not present

## 2016-03-11 DIAGNOSIS — I2510 Atherosclerotic heart disease of native coronary artery without angina pectoris: Secondary | ICD-10-CM | POA: Diagnosis not present

## 2016-03-11 NOTE — Telephone Encounter (Signed)
Raymond [name],    Please assist with the following.  This man underwent ERCP with me on Sun, 2/25 for CBD stone.  I was unable to extract the stone, so I placed a stent. I told the family he would need another ERCP in a few weeks with additional equipment to extract the stone.  Upon further consideration, I have decided it would be best for him to have that done at an academic institution where they have more experience with these more challenging cases.  I have spoken to Dr. Justice Britain , the advanced endoscopy fellow at [facility].  He agreed to help, and they would plan to see the patient in 3-4 weeks for a clinic visit and ERCP the same day. Please fax them a referral, my initial hospital consult note, the abdominal CT scan report from last week, hospital LFTs and the patient information. The scheduler at [facility] is Raymond [name]  [phone].  I expect she can give you their fax number.  I am afraid there are complicated family dynamics.  Raymond [name] has dementia and his long time partner Raymond [name] is reportedly his [name]. (home [phone] , mobile [phone].  No answer at that home number this afternoon, and I left a brief message on the mobile voicemail today.  I said we would be referring Raymond [name] out to [place] and she would hear from [place] with further instructions.  During the hospital visit, I also met the patient's son Raymond [name] and his wife Raymond [name]. The son gave consent for the procedure because Raymond [name] in her possession.  When I called the son at home today to discuss the above plan, I was told Raymond [name] and would not allow the son to be involved in any more medical decisions.   In sum, Raymond [name] makes the decisions for Raymond [name] and will need to be present when he goes to [facility].  Raymond [name] apparently went to a SNF in [facility] but I do not know how long he will be there.  When you are able to reach Raymond [name], please try  to find out how long Raymond [name] will be there so I can find out how to get a set of LFTs drawn sometime early next week and make sure they are coming down as expected.  I think we should keep his 3/12 10:45 appt on the books in case we need to do some face-to-face coordination of the [place] scheduling.

## 2016-03-12 ENCOUNTER — Telehealth: Payer: Self-pay

## 2016-03-12 NOTE — Telephone Encounter (Signed)
-----   Message from Joelyn Oms sent at 03/12/2016  1:45 PM EST ----- Regarding: Beth @ advanced endoscopy Contact: [phone] Eustaquio Maize wants to give you  A different fax # [phone] to send records to.

## 2016-03-12 NOTE — Telephone Encounter (Signed)
Faxed referral over to [facility], attn: Nada Maclachlan. Left message for her to call to verify received info. [phone]. [place], let her know what Dr. Loletha Carrow' recommendations and plans are as far as appointment at [facility] with Dr. Rush Landmark. I told her to expect a call from them, they would like to have initial consult and procedure done the same day.  Called to SNF, [facility] at [phone], spoke to nurse, Pamala Hurry, who took verbal order to draw LFT's on Tuesday, 3/6 and fax results here. According to [facility], patient will be at [facility] for next 20 days to regain strength.

## 2016-03-12 NOTE — Telephone Encounter (Signed)
Refaxed referral information to: [phone].

## 2016-03-15 DIAGNOSIS — W19XXXD Unspecified fall, subsequent encounter: Secondary | ICD-10-CM | POA: Diagnosis not present

## 2016-03-15 DIAGNOSIS — I2510 Atherosclerotic heart disease of native coronary artery without angina pectoris: Secondary | ICD-10-CM | POA: Diagnosis not present

## 2016-03-15 DIAGNOSIS — K8051 Calculus of bile duct without cholangitis or cholecystitis with obstruction: Secondary | ICD-10-CM | POA: Diagnosis not present

## 2016-03-15 DIAGNOSIS — I10 Essential (primary) hypertension: Secondary | ICD-10-CM | POA: Diagnosis not present

## 2016-03-18 DIAGNOSIS — K8051 Calculus of bile duct without cholangitis or cholecystitis with obstruction: Secondary | ICD-10-CM | POA: Diagnosis not present

## 2016-03-18 DIAGNOSIS — N189 Chronic kidney disease, unspecified: Secondary | ICD-10-CM | POA: Diagnosis not present

## 2016-03-18 DIAGNOSIS — I10 Essential (primary) hypertension: Secondary | ICD-10-CM | POA: Diagnosis not present

## 2016-03-18 DIAGNOSIS — I2510 Atherosclerotic heart disease of native coronary artery without angina pectoris: Secondary | ICD-10-CM | POA: Diagnosis not present

## 2016-03-22 ENCOUNTER — Ambulatory Visit: Payer: Medicare Other | Admitting: Gastroenterology

## 2016-03-23 ENCOUNTER — Telehealth: Payer: Self-pay | Admitting: Gastroenterology

## 2016-03-23 NOTE — Telephone Encounter (Signed)
Error

## 2016-03-24 ENCOUNTER — Telehealth: Payer: Self-pay

## 2016-03-24 NOTE — Telephone Encounter (Signed)
Called to [facility], spoke to [place] [facility]., she confirmed that patient is scheduled for consult at 9:45 and ERCP procedure at 11:00 on 3/28. Called to [facility], they will contact lab to get LFT results and fax to our office this afternoon.

## 2016-03-26 DIAGNOSIS — I10 Essential (primary) hypertension: Secondary | ICD-10-CM | POA: Diagnosis not present

## 2016-03-26 DIAGNOSIS — K8051 Calculus of bile duct without cholangitis or cholecystitis with obstruction: Secondary | ICD-10-CM | POA: Diagnosis not present

## 2016-03-26 DIAGNOSIS — R609 Edema, unspecified: Secondary | ICD-10-CM | POA: Diagnosis not present

## 2016-03-26 DIAGNOSIS — N189 Chronic kidney disease, unspecified: Secondary | ICD-10-CM | POA: Diagnosis not present

## 2016-03-29 DIAGNOSIS — I129 Hypertensive chronic kidney disease with stage 1 through stage 4 chronic kidney disease, or unspecified chronic kidney disease: Secondary | ICD-10-CM | POA: Diagnosis not present

## 2016-03-29 DIAGNOSIS — Z48815 Encounter for surgical aftercare following surgery on the digestive system: Secondary | ICD-10-CM | POA: Diagnosis not present

## 2016-03-30 ENCOUNTER — Telehealth: Payer: Self-pay

## 2016-03-30 NOTE — Telephone Encounter (Signed)
Spoke to [facility], patient's caretaker, to let her know that Dr. Loletha Carrow said that a follow up here was not needed at this time. Cancelled his appointment for 3/27, he does have a consult and scheduled procedure with [facility] on 3/28.

## 2016-03-31 DIAGNOSIS — Z48815 Encounter for surgical aftercare following surgery on the digestive system: Secondary | ICD-10-CM | POA: Diagnosis not present

## 2016-03-31 DIAGNOSIS — I129 Hypertensive chronic kidney disease with stage 1 through stage 4 chronic kidney disease, or unspecified chronic kidney disease: Secondary | ICD-10-CM | POA: Diagnosis not present

## 2016-04-01 ENCOUNTER — Other Ambulatory Visit: Payer: Self-pay | Admitting: *Deleted

## 2016-04-01 DIAGNOSIS — D509 Iron deficiency anemia, unspecified: Secondary | ICD-10-CM | POA: Diagnosis not present

## 2016-04-01 DIAGNOSIS — D649 Anemia, unspecified: Secondary | ICD-10-CM | POA: Diagnosis not present

## 2016-04-01 DIAGNOSIS — F0281 Dementia in other diseases classified elsewhere with behavioral disturbance: Secondary | ICD-10-CM | POA: Diagnosis not present

## 2016-04-01 DIAGNOSIS — I10 Essential (primary) hypertension: Secondary | ICD-10-CM | POA: Diagnosis not present

## 2016-04-01 DIAGNOSIS — K8020 Calculus of gallbladder without cholecystitis without obstruction: Secondary | ICD-10-CM | POA: Diagnosis not present

## 2016-04-01 DIAGNOSIS — Z09 Encounter for follow-up examination after completed treatment for conditions other than malignant neoplasm: Secondary | ICD-10-CM | POA: Diagnosis not present

## 2016-04-01 DIAGNOSIS — Z681 Body mass index (BMI) 19 or less, adult: Secondary | ICD-10-CM | POA: Diagnosis not present

## 2016-04-01 DIAGNOSIS — K8050 Calculus of bile duct without cholangitis or cholecystitis without obstruction: Secondary | ICD-10-CM | POA: Diagnosis not present

## 2016-04-01 NOTE — Patient Outreach (Signed)
[facility]) Care Management  04/01/2016  CRIMSON BEER Jun 04, 1926 [id number]   Met with Izell [place], SW at facilty. He states patient friend, Benay Pike has not decided on LTC placement or discharge to home as of yet. Patient getting close to discharge but no date set.   [facility] attempted to speak with patient and caregiver, unable to meet. Will attempt to contact at next facility visit or contact caregiver by phone.  Royetta Crochet. Laymond Purser, RN, BSN, [place] [phone]) Business Cell  [phone]) Toll Free Office

## 2016-04-03 DIAGNOSIS — I129 Hypertensive chronic kidney disease with stage 1 through stage 4 chronic kidney disease, or unspecified chronic kidney disease: Secondary | ICD-10-CM | POA: Diagnosis not present

## 2016-04-03 DIAGNOSIS — Z48815 Encounter for surgical aftercare following surgery on the digestive system: Secondary | ICD-10-CM | POA: Diagnosis not present

## 2016-04-06 ENCOUNTER — Ambulatory Visit: Payer: Medicare Other | Admitting: Gastroenterology

## 2016-04-06 DIAGNOSIS — I129 Hypertensive chronic kidney disease with stage 1 through stage 4 chronic kidney disease, or unspecified chronic kidney disease: Secondary | ICD-10-CM | POA: Diagnosis not present

## 2016-04-06 DIAGNOSIS — Z48815 Encounter for surgical aftercare following surgery on the digestive system: Secondary | ICD-10-CM | POA: Diagnosis not present

## 2016-04-07 ENCOUNTER — Telehealth: Payer: Self-pay | Admitting: Gastroenterology

## 2016-04-07 DIAGNOSIS — I10 Essential (primary) hypertension: Secondary | ICD-10-CM | POA: Diagnosis not present

## 2016-04-07 DIAGNOSIS — I248 Other forms of acute ischemic heart disease: Secondary | ICD-10-CM | POA: Insufficient documentation

## 2016-04-07 DIAGNOSIS — E785 Hyperlipidemia, unspecified: Secondary | ICD-10-CM | POA: Diagnosis not present

## 2016-04-07 DIAGNOSIS — I359 Nonrheumatic aortic valve disorder, unspecified: Secondary | ICD-10-CM | POA: Insufficient documentation

## 2016-04-07 DIAGNOSIS — Z5309 Procedure and treatment not carried out because of other contraindication: Secondary | ICD-10-CM | POA: Diagnosis not present

## 2016-04-07 DIAGNOSIS — R9431 Abnormal electrocardiogram [ECG] [EKG]: Secondary | ICD-10-CM | POA: Diagnosis not present

## 2016-04-07 DIAGNOSIS — I252 Old myocardial infarction: Secondary | ICD-10-CM | POA: Insufficient documentation

## 2016-04-07 DIAGNOSIS — K831 Obstruction of bile duct: Secondary | ICD-10-CM | POA: Diagnosis not present

## 2016-04-07 DIAGNOSIS — R938 Abnormal findings on diagnostic imaging of other specified body structures: Secondary | ICD-10-CM | POA: Diagnosis not present

## 2016-04-07 DIAGNOSIS — Z79899 Other long term (current) drug therapy: Secondary | ICD-10-CM | POA: Diagnosis not present

## 2016-04-07 DIAGNOSIS — Z87891 Personal history of nicotine dependence: Secondary | ICD-10-CM | POA: Diagnosis not present

## 2016-04-07 DIAGNOSIS — Z7982 Long term (current) use of aspirin: Secondary | ICD-10-CM | POA: Diagnosis not present

## 2016-04-07 NOTE — Telephone Encounter (Signed)
Routed to Dr. Danis. 

## 2016-04-07 NOTE — Telephone Encounter (Signed)
pt caretaker states that [facility] was not able to do procedure today due to pt needing to see cardiologist first. Pt caretaker states that the stent in now is okay and wants to know if Dr.Danis can refer him to a cardiologist and if we now have the equipment to do the procedure if he can have it done here instead of [facility]

## 2016-04-08 ENCOUNTER — Other Ambulatory Visit: Payer: Self-pay

## 2016-04-08 DIAGNOSIS — Z48815 Encounter for surgical aftercare following surgery on the digestive system: Secondary | ICD-10-CM | POA: Diagnosis not present

## 2016-04-08 DIAGNOSIS — K8050 Calculus of bile duct without cholangitis or cholecystitis without obstruction: Secondary | ICD-10-CM

## 2016-04-08 DIAGNOSIS — I129 Hypertensive chronic kidney disease with stage 1 through stage 4 chronic kidney disease, or unspecified chronic kidney disease: Secondary | ICD-10-CM | POA: Diagnosis not present

## 2016-04-08 NOTE — Progress Notes (Signed)
Pt discharged to [facility] SNF on 03/09/2016. Medication reconciliation completed by PhamD with [facility] on 04/08/2016. No issues to note.   Angela Burke, PharmD, BCPS Pharmacy Resident Pager: [phone]

## 2016-04-08 NOTE — Telephone Encounter (Signed)
Spoke to [name], she is aware that patient has appointment with Dr. Bronson Ing on 3/30 at 10:40 for cardiac clearance. They are having recent lab work faxed to our office today from Dr. Nevada Crane. Patient's caregiver understands that procedure is scheduled for 4/11 at [facility] 12:30. I let her know that I will be sending out prep instructions that include patient not taking his ASA 81 mg tablet 5 days prior.

## 2016-04-08 NOTE — Telephone Encounter (Signed)
Left a message for caregiver, [name], to call back. Procedure is scheduled at [facility] on 4/11 at 12:30. Patient needs to come to our lab mid next week for labs. I do not see where patient has a cardiologist in our system that he has seen recently, last visit was 09/2009 to Dr. Jacqulyn Ducking. Need to find out who patient is seeing for his heart, need to get cardiac clearance next week prior to scheduled procedure.

## 2016-04-08 NOTE — Telephone Encounter (Signed)
That is disappointing.  I am afraid [facility] did not call me about that, so I do not know why they wanted him to be seen by cardiology before his procedure.  He has a history of coronary disease, but he was not having any active cardiac issues when admitted to [facility] last month.  And he underwent that ERCP without cardiac complications.  That stent cannot stay in much longer because it may clog.  So I will do his ERCP and make another attempt to get the stone out. I am away next week, so the only time I have available to do that in the near future is Wed April 11th in the afternoon. It needs to be booked as EGD with balloon dilation and ERCP with stone extraction and Spyglass.   Would like 12:30 or 1pm if available, can be at either [facility] or [facility] outpatient endo lab.  No aspirin 5 days prior.  Please let me know if that procedure time is confirmed, and I will contact the Spyglass rep to be there.  Please have him see Cardiology next week for pre-op risk evaluation so that does not become an obstacle to either our anesthesia service or , if necessary, [facility] anesthesia service agreeing to give him general anesthesia for an ERCP.  I never did see the LFTs done 2 weeks ago at his rehab facility, even after we made 2 requests to them.  Please arrange for him to come to our lab early to mid next week for CBC and CMP.

## 2016-04-09 ENCOUNTER — Ambulatory Visit: Payer: Medicare Other | Admitting: Cardiovascular Disease

## 2016-04-11 NOTE — Progress Notes (Signed)
Cardiology Office Note   Date:  04/12/2016   ID:  Raymond [name], DOB 05-Feb-1926, MRN [id number]  PCP:  Wende Neighbors, MD  Cardiologist:   Jenkins Rouge, MD   No chief complaint on file.     History of Present Illness: Raymond [name] is a 83 y.o. male who presents for pre operative clearance Referred by Dr Loletha Carrow GI.  Has procedure scheduled 04/21/16 for ERCP and stone retrieval Had ERCP and stent 03/07/16 with no cardiac complications. Was sent to [facility] for stent removal but procedure cancelled as they wanted cardiology clearance Very distant history of CAD/MI Last seen by Dr [name] 2011 Reviewed his notes indicated MI 1999 Rx [facility] Records Not available Additional notes indicate high grade distal LAD and OM1 Rx at that time  Known atherosclerosis of aorta Seen by [facility] 2017 no claudication and [place] with no AAA  I reviewed the ECG that was done just prior to scheduled procedure at [facility] 3/28 and he did have new T wave inversions in 3,F Compared to ECG done 03/04/16 that should PVCls but no T wave changes   He has been in [place] last month Not active Had SSCP with acute GB issues but in general has not had angina  No palpitations dyspnea or syncope    Past Medical History:  Diagnosis Date  . Anemia   . ASCVD (arteriosclerotic cardiovascular disease)    2 MI's in 1990s  . Chronic renal insufficiency, stage II (mild)    CrCl in the 60s  . Dementia   . DJD (degenerative joint disease)   . Esophageal dysphagia    Secondary to Schatzki's ring with dilation x2, most recently in 06/09, negative for H. pylori  . GERD (gastroesophageal reflux disease)   . HTN (hypertension)    BP meds stopped 2016 due to recurrent orthostatic hypotension  . Hyperlipidemia   . Hypothyroidism   . Mild cognitive impairment with memory loss   . Myocardial infarction 08-1996   Cardiac cath done but no other intervention that we know of  . Orthostatic hypotension   . Psoriasis   . Thrombocytopenia ([place])    Platelets 99K on 09/2009 labs  . Ulcerative proctitis ([place])    Dx: 2000, started Asacol at that time.  Was taken off asacol 2014 and has done fine since (Dr. Sydell Axon, [facility])    Past Surgical History:  Procedure Laterality Date  . APPENDECTOMY  1940  . CLEFT LIP REPAIR     As a child  . COLECTOMY  2004   Colonovesicular fistula secondary to diverticulitis requiring sigmoid colectomy and bladder repair  . COLONOSCOPY  06/2007   Dr. Olegario Messier: Few diverticula, normal anastomosiss/p sigmoid colectomy for diverticulitis  . colovesicular fistula  2004  . ERCP N/A 03/07/2016   Procedure: ENDOSCOPIC RETROGRADE CHOLANGIOPANCREATOGRAPHY (ERCP);  Surgeon: Doran Stabler, MD;  Location: [facility] ENDOSCOPY;  Service: Endoscopy;  Laterality: N/A;  . ESOPHAGEAL DILATION  06-23-07; 06/28/12   H pylori NEG.  2014-reactive gastropathy with surface erosion.  . ESOPHAGOGASTRODUODENOSCOPY  06/2007   Dr. Olegario Messier: symptomatic Schatzki ring, s/p 42mm Savary dilator, Erosive duodenitis, s/p bx   . ESOPHAGOGASTRODUODENOSCOPY (EGD) WITH ESOPHAGEAL DILATION N/A 06/28/2012   SVX:[id number] cervical web and distal esophageal s/p dilation/Hiatal hernia. Antral erosions (reactive gastropathy, no H.pylori)  . HERNIA REPAIR    . NECK MASS EXCISION  1979     Current Outpatient Prescriptions  Medication Sig Dispense Refill  . acetaminophen (TYLENOL) 325  MG tablet Take 2 tablets (650 mg total) by mouth every 6 (six) hours as needed for moderate pain.    Marland Kitchen amLODipine (NORVASC) 2.5 MG tablet Take 1 tablet (2.5 mg total) by mouth daily.    Marland Kitchen aspirin 81 MG tablet Take 81 mg by mouth daily.      Marland Kitchen atenolol (TENORMIN) 50 MG tablet Take 1 tablet (50 mg total) by mouth daily.    Marland Kitchen atorvastatin (LIPITOR) 20 MG tablet TAKE ONE-HALF TABLET BY MOUTH ONCE DAILY 90 tablet 1  . benzonatate (TESSALON) 200 MG capsule Take 1 capsule (200 mg total) by mouth 3 (three) times daily. 20 capsule 0  . donepezil (ARICEPT) 10 MG  tablet TAKE ONE TABLET BY MOUTH ONCE DAILY AT BEDTIME 90 tablet 3  . haloperidol lactate (HALDOL) 5 MG/ML injection Inject 0.2 mLs (1 mg total) into the vein every 8 (eight) hours as needed. 1 mL   . levothyroxine (SYNTHROID, LEVOTHROID) 112 MCG tablet TAKE ONE TABLET BY MOUTH ONCE DAILY BEFORE BREAKFAST 90 tablet 1  . niacin 500 MG CR capsule Take 2 capsules (1,000 mg total) by mouth daily. 90 capsule 0  . nitroGLYCERIN (NITROSTAT) 0.4 MG SL tablet Place 1 tablet (0.4 mg total) under the tongue every 5 (five) minutes as needed for chest pain. 15 tablet 1  . ondansetron (ZOFRAN-ODT) 4 MG disintegrating tablet Take 1 tablet by mouth daily as needed for nausea/vomiting.    . pantoprazole (PROTONIX) 40 MG tablet TAKE ONE TABLET BY MOUTH ONCE DAILY BEFORE BREAKFAST 90 tablet 3  . risperiDONE (RISPERDAL) 0.5 MG tablet Take 1 tablet (0.5 mg total) by mouth 2 (two) times daily. 20 tablet 0   No current facility-administered medications for this visit.     Allergies:   Patient has no known allergies.    Social History:  The patient  reports that he quit smoking about 46 years ago. His smoking use included Cigarettes. He has a 10.00 pack-year smoking history. He has never used smokeless tobacco. He reports that he does not drink alcohol or use drugs.   Family History:  The patient's family history includes Cancer in his father; Heart disease in his daughter.    ROS:  Please see the history of present illness.   Otherwise, review of systems are positive for none.   All other systems are reviewed and negative.    PHYSICAL EXAM: VS:  BP (!) 100/56   Pulse (!) 58   Ht 5\' 8"  (1.727 m)   Wt 137 lb (62.1 kg)   SpO2 94%   BMI 20.83 kg/m  , BMI Body mass index is 20.83 kg/m. Affect appropriate Pale frail elderly white male  HEENT: normal Neck supple with no adenopathy JVP normal no bruits no thyromegaly Lungs clear with no wheezing and good diaphragmatic motion Heart:  S1/S2 no murmur, no rub,  gallop or click PMI normal Abdomen: benighn, BS positve, no tenderness, no AAA no bruit.  No HSM or HJR Distal pulses intact with no bruits No edema Neuro non-focal Skin warm and dry No muscular weakness    EKG:   03/05/16 SR PVC;s otherwise normal computer reads LBBB I disagree   Recent Labs: 03/09/2016: ALT 85; BUN 16; Creatinine, Ser 0.99; Hemoglobin 8.9; Platelets 129; Potassium 4.2; Sodium 141    Lipid Panel    Component Value Date/Time   CHOL 135 04/25/2014 1057   TRIG 123.0 04/25/2014 1057   HDL 35.80 (L) 04/25/2014 1057   CHOLHDL 4 04/25/2014 1057  VLDL 24.6 04/25/2014 1057   LDLCALC 75 04/25/2014 1057      Wt Readings from Last 3 Encounters:  04/12/16 137 lb (62.1 kg)  03/08/16 145 lb 11.6 oz (66.1 kg)  03/03/16 148 lb (67.1 kg)      Other studies Reviewed: Additional studies/ records that were reviewed today include: Notes Dr [name], [facility] and Dr Loletha Carrow Multiple ECGls see HPI.    ASSESSMENT AND PLAN:  1. Pre Op concern that he probably cannot do 4 METZ  History of CAD with labile T wave changes. Will order lexiscan myovue to risk stratify 2. CAD/MI  See above  3. Thyroid  Continue replacement recent TSH ok  4. Dementia on haldol and respiradol seems competent on interview today although wife mostly cares for him  5. GI has stent / stone retrieval scheduled for 4/11 with Dr Loletha Carrow hopefully myovue will be low risk    Current medicines are reviewed at length with the patient today.  The patient does not have concerns regarding medicines.  The following changes have been made:  no change  Labs/ tests ordered today include: Lexiscan Myovue  No orders of the defined types were placed in this encounter.    Disposition:   FU with me in 3 months      Signed, Jenkins Rouge, MD  04/12/2016 10:45 AM    [facility] [place], [place], [place]  [address] Phone: [phone]; Fax: [phone]

## 2016-04-12 ENCOUNTER — Encounter: Payer: Self-pay | Admitting: Cardiovascular Disease

## 2016-04-12 ENCOUNTER — Ambulatory Visit (INDEPENDENT_AMBULATORY_CARE_PROVIDER_SITE_OTHER): Payer: Medicare Other | Admitting: Cardiovascular Disease

## 2016-04-12 ENCOUNTER — Encounter: Payer: Self-pay | Admitting: *Deleted

## 2016-04-12 VITALS — BP 100/56 | HR 58 | Ht 68.0 in | Wt 137.0 lb

## 2016-04-12 DIAGNOSIS — Z01818 Encounter for other preprocedural examination: Secondary | ICD-10-CM

## 2016-04-12 NOTE — Patient Instructions (Signed)
Your physician recommends that you schedule a follow-up appointment in: 3 Months  Your physician recommends that you continue on your current medications as directed. Please refer to the Current Medication list given to you today.  Your physician has requested that you have a lexiscan myoview. For further information please visit [email or web address]. Please follow instruction sheet, as given.  If you need a refill on your cardiac medications before your next appointment, please call your pharmacy.  Thank you for choosing [facility]!

## 2016-04-13 ENCOUNTER — Ambulatory Visit (HOSPITAL_COMMUNITY)
Admission: RE | Admit: 2016-04-13 | Discharge: 2016-04-13 | Disposition: A | Discharge status: Home / Self Care | Payer: Medicare Other | Source: Ambulatory Visit | Attending: Cardiovascular Disease | Admitting: Cardiovascular Disease

## 2016-04-13 ENCOUNTER — Encounter (HOSPITAL_COMMUNITY): Payer: Self-pay

## 2016-04-13 ENCOUNTER — Encounter (HOSPITAL_COMMUNITY)
Admission: RE | Admit: 2016-04-13 | Discharge: 2016-04-13 | Disposition: A | Discharge status: Home / Self Care | Payer: Medicare Other | Source: Ambulatory Visit | Attending: Cardiovascular Disease | Admitting: Cardiovascular Disease

## 2016-04-13 DIAGNOSIS — Z48815 Encounter for surgical aftercare following surgery on the digestive system: Secondary | ICD-10-CM | POA: Diagnosis not present

## 2016-04-13 DIAGNOSIS — Z01818 Encounter for other preprocedural examination: Secondary | ICD-10-CM | POA: Diagnosis not present

## 2016-04-13 DIAGNOSIS — I129 Hypertensive chronic kidney disease with stage 1 through stage 4 chronic kidney disease, or unspecified chronic kidney disease: Secondary | ICD-10-CM | POA: Diagnosis not present

## 2016-04-13 LAB — NM MYOCAR MULTI W/SPECT W/WALL MOTION / EF
CHL CUP NUCLEAR SSS: 17
LHR: 0
LV sys vol: 50 mL
LVDIAVOL: 91 mL (ref 62–150)
NUC STRESS TID: 1.15
Peak HR: 74 {beats}/min
Rest HR: 53 {beats}/min
SDS: 4
SRS: 13

## 2016-04-13 MED ORDER — SODIUM CHLORIDE 0.9% FLUSH
INTRAVENOUS | Status: AC
  Administered 2016-04-13: 10 mL via INTRAVENOUS
  Filled 2016-04-13: qty 10

## 2016-04-13 MED ORDER — TECHNETIUM TC 99M TETROFOSMIN IV KIT
30.0000 | PACK | Freq: Once | INTRAVENOUS | Status: AC | PRN
  Administered 2016-04-13: 30 via INTRAVENOUS

## 2016-04-13 MED ORDER — REGADENOSON 0.4 MG/5ML IV SOLN
INTRAVENOUS | Status: AC
  Administered 2016-04-13: 0.4 mg via INTRAVENOUS
  Filled 2016-04-13: qty 5

## 2016-04-13 MED ORDER — TECHNETIUM TC 99M TETROFOSMIN IV KIT
10.0000 | PACK | Freq: Once | INTRAVENOUS | Status: AC | PRN
  Administered 2016-04-13: 11 via INTRAVENOUS

## 2016-04-14 ENCOUNTER — Telehealth: Payer: Self-pay | Admitting: Gastroenterology

## 2016-04-14 NOTE — Telephone Encounter (Signed)
Left message for Ms. Raymond [name], as we spoke on 3/28, she was aware that procedure is scheduled at [facility] on 4/11, arrive at 11:00 am for 12:30 procedure. Nothing to eat or drink after midnight. I am still waiting on cardiac clearance from Dr. Johnsie Cancel.

## 2016-04-15 ENCOUNTER — Telehealth: Payer: Self-pay

## 2016-04-15 ENCOUNTER — Telehealth: Payer: Self-pay | Admitting: Cardiovascular Disease

## 2016-04-15 DIAGNOSIS — I129 Hypertensive chronic kidney disease with stage 1 through stage 4 chronic kidney disease, or unspecified chronic kidney disease: Secondary | ICD-10-CM | POA: Diagnosis not present

## 2016-04-15 DIAGNOSIS — Z48815 Encounter for surgical aftercare following surgery on the digestive system: Secondary | ICD-10-CM | POA: Diagnosis not present

## 2016-04-15 NOTE — Telephone Encounter (Signed)
Results of myoview / patient's POA / tg

## 2016-04-15 NOTE — Telephone Encounter (Signed)
Patient had myoview done on 04/13/16. Needs clearance for surgery. May put in EPIC. / tg

## 2016-04-15 NOTE — Telephone Encounter (Signed)
Per Dr Johnsie Cancel, no ischemia, pt cleared for surgery,phone rings and says "memory is full" unable to leave message

## 2016-04-15 NOTE — Telephone Encounter (Signed)
Per Dr Johnsie Cancel, on ischemia noted on Myoview, and patient is cleared for surgery.

## 2016-04-15 NOTE — Telephone Encounter (Signed)
Spoke to [place], medical POA, let her know that I did review Dr. Kyla Balzarine note in the chart about patient being cleared to have procedure. I have asked her to contact his office, as they were not able to lvm for them with test result.  She has still not received the prep instructions in the mail, so a copy will be at our front desk for her to pick up. Reminded her that patient will need to be off his aspirin 5 days prior.

## 2016-04-16 DIAGNOSIS — Z48815 Encounter for surgical aftercare following surgery on the digestive system: Secondary | ICD-10-CM | POA: Diagnosis not present

## 2016-04-16 DIAGNOSIS — I129 Hypertensive chronic kidney disease with stage 1 through stage 4 chronic kidney disease, or unspecified chronic kidney disease: Secondary | ICD-10-CM | POA: Diagnosis not present

## 2016-04-16 NOTE — Telephone Encounter (Signed)
Attempt to reach pt again,phone just rings

## 2016-04-16 NOTE — Telephone Encounter (Signed)
Dr Johnsie Cancel sent message to dr Pincus Sanes clearing pt for procedure

## 2016-04-18 DIAGNOSIS — Z48815 Encounter for surgical aftercare following surgery on the digestive system: Secondary | ICD-10-CM | POA: Diagnosis not present

## 2016-04-18 DIAGNOSIS — I129 Hypertensive chronic kidney disease with stage 1 through stage 4 chronic kidney disease, or unspecified chronic kidney disease: Secondary | ICD-10-CM | POA: Diagnosis not present

## 2016-04-20 ENCOUNTER — Encounter (HOSPITAL_COMMUNITY): Payer: Self-pay | Admitting: *Deleted

## 2016-04-20 DIAGNOSIS — I129 Hypertensive chronic kidney disease with stage 1 through stage 4 chronic kidney disease, or unspecified chronic kidney disease: Secondary | ICD-10-CM | POA: Diagnosis not present

## 2016-04-20 DIAGNOSIS — Z48815 Encounter for surgical aftercare following surgery on the digestive system: Secondary | ICD-10-CM | POA: Diagnosis not present

## 2016-04-20 NOTE — Progress Notes (Signed)
Pt SDW-Pre-op call completed by pt guardian, [name]. [name] denies that pt C/O SOB and chest pain. [name] stated that pt under the care of Dr. Johnsie Cancel, Cardiology (see clearance note in Epic). [name] stated that pt last dose of Aspirin was last Thursday as instructed by MD. [name] made aware to have pt stop taking vitamins, fish oil and herbal medications. Do not take any NSAIDs ie: Ibuprofen, Advil, Naproxen, BC and Goody Powder or any medication containing Aspirin. Lula verbalized understanding of all pre-op instructions.

## 2016-04-21 ENCOUNTER — Ambulatory Visit (HOSPITAL_COMMUNITY): Admit: 2016-04-21 | Payer: Medicare Other | Admitting: Gastroenterology

## 2016-04-21 ENCOUNTER — Ambulatory Visit (HOSPITAL_COMMUNITY): Payer: Medicare Other | Admitting: Certified Registered Nurse Anesthetist

## 2016-04-21 ENCOUNTER — Ambulatory Visit (HOSPITAL_COMMUNITY): Payer: Medicare Other

## 2016-04-21 ENCOUNTER — Encounter (HOSPITAL_COMMUNITY): Payer: Self-pay

## 2016-04-21 ENCOUNTER — Encounter (HOSPITAL_COMMUNITY)
Admission: RE | Disposition: A | Discharge status: Home / Self Care | Payer: Self-pay | Source: Ambulatory Visit | Attending: Gastroenterology

## 2016-04-21 ENCOUNTER — Ambulatory Visit (HOSPITAL_COMMUNITY)
Admission: RE | Admit: 2016-04-21 | Discharge: 2016-04-21 | Disposition: A | Discharge status: Home / Self Care | Payer: Medicare Other | Source: Ambulatory Visit | Attending: Gastroenterology | Admitting: Gastroenterology

## 2016-04-21 DIAGNOSIS — Z7982 Long term (current) use of aspirin: Secondary | ICD-10-CM | POA: Diagnosis not present

## 2016-04-21 DIAGNOSIS — I129 Hypertensive chronic kidney disease with stage 1 through stage 4 chronic kidney disease, or unspecified chronic kidney disease: Secondary | ICD-10-CM | POA: Diagnosis not present

## 2016-04-21 DIAGNOSIS — Q399 Congenital malformation of esophagus, unspecified: Secondary | ICD-10-CM | POA: Diagnosis not present

## 2016-04-21 DIAGNOSIS — E039 Hypothyroidism, unspecified: Secondary | ICD-10-CM | POA: Insufficient documentation

## 2016-04-21 DIAGNOSIS — K8050 Calculus of bile duct without cholangitis or cholecystitis without obstruction: Secondary | ICD-10-CM

## 2016-04-21 DIAGNOSIS — K219 Gastro-esophageal reflux disease without esophagitis: Secondary | ICD-10-CM | POA: Diagnosis not present

## 2016-04-21 DIAGNOSIS — F0390 Unspecified dementia without behavioral disturbance: Secondary | ICD-10-CM | POA: Insufficient documentation

## 2016-04-21 DIAGNOSIS — I10 Essential (primary) hypertension: Secondary | ICD-10-CM | POA: Diagnosis not present

## 2016-04-21 DIAGNOSIS — Z9049 Acquired absence of other specified parts of digestive tract: Secondary | ICD-10-CM | POA: Insufficient documentation

## 2016-04-21 DIAGNOSIS — Z79899 Other long term (current) drug therapy: Secondary | ICD-10-CM | POA: Insufficient documentation

## 2016-04-21 DIAGNOSIS — I2510 Atherosclerotic heart disease of native coronary artery without angina pectoris: Secondary | ICD-10-CM | POA: Diagnosis not present

## 2016-04-21 DIAGNOSIS — K838 Other specified diseases of biliary tract: Secondary | ICD-10-CM | POA: Diagnosis not present

## 2016-04-21 DIAGNOSIS — N182 Chronic kidney disease, stage 2 (mild): Secondary | ICD-10-CM | POA: Insufficient documentation

## 2016-04-21 DIAGNOSIS — E785 Hyperlipidemia, unspecified: Secondary | ICD-10-CM | POA: Insufficient documentation

## 2016-04-21 DIAGNOSIS — K222 Esophageal obstruction: Secondary | ICD-10-CM | POA: Diagnosis not present

## 2016-04-21 DIAGNOSIS — Z87891 Personal history of nicotine dependence: Secondary | ICD-10-CM | POA: Diagnosis not present

## 2016-04-21 DIAGNOSIS — I252 Old myocardial infarction: Secondary | ICD-10-CM | POA: Diagnosis not present

## 2016-04-21 DIAGNOSIS — Z4659 Encounter for fitting and adjustment of other gastrointestinal appliance and device: Secondary | ICD-10-CM | POA: Diagnosis not present

## 2016-04-21 HISTORY — PX: ERCP: SHX5425

## 2016-04-21 HISTORY — DX: Calculus of gallbladder without cholecystitis without obstruction: K80.20

## 2016-04-21 HISTORY — PX: ESOPHAGOGASTRODUODENOSCOPY: SHX5428

## 2016-04-21 SURGERY — ERCP, WITH INTERVENTION IF INDICATED
Anesthesia: General

## 2016-04-21 SURGERY — EGD (ESOPHAGOGASTRODUODENOSCOPY)
Anesthesia: General

## 2016-04-21 MED ORDER — SODIUM CHLORIDE 0.9 % IV SOLN: INTRAVENOUS | Status: DC

## 2016-04-21 MED ORDER — IOPAMIDOL (ISOVUE-300) INJECTION 61%
INTRAVENOUS | Status: AC
  Filled 2016-04-21: qty 50

## 2016-04-21 MED ORDER — PHENYLEPHRINE HCL 10 MG/ML IJ SOLN
INTRAVENOUS | Status: DC | PRN
  Administered 2016-04-21: 30 ug/min via INTRAVENOUS

## 2016-04-21 MED ORDER — EPHEDRINE SULFATE-NACL 50-0.9 MG/10ML-% IV SOSY
PREFILLED_SYRINGE | INTRAVENOUS | Status: DC | PRN
  Administered 2016-04-21 (×2): 10 mg via INTRAVENOUS

## 2016-04-21 MED ORDER — CIPROFLOXACIN IN D5W 400 MG/200ML IV SOLN
INTRAVENOUS | Status: AC
  Filled 2016-04-21: qty 200

## 2016-04-21 MED ORDER — GLUCAGON HCL RDNA (DIAGNOSTIC) 1 MG IJ SOLR
INTRAMUSCULAR | Status: AC
  Filled 2016-04-21: qty 1

## 2016-04-21 MED ORDER — LACTATED RINGERS IV SOLN
INTRAVENOUS | Status: DC
  Administered 2016-04-21: 1000 mL via INTRAVENOUS

## 2016-04-21 MED ORDER — IOPAMIDOL (ISOVUE-300) INJECTION 61%
INTRAVENOUS | Status: DC | PRN
  Administered 2016-04-21: 120 mL

## 2016-04-21 MED ORDER — SUGAMMADEX SODIUM 200 MG/2ML IV SOLN
INTRAVENOUS | Status: DC | PRN
  Administered 2016-04-21: 120 mg via INTRAVENOUS

## 2016-04-21 MED ORDER — FENTANYL CITRATE (PF) 250 MCG/5ML IJ SOLN
INTRAMUSCULAR | Status: DC | PRN
  Administered 2016-04-21: 75 ug via INTRAVENOUS

## 2016-04-21 MED ORDER — INDOMETHACIN 50 MG RE SUPP
RECTAL | Status: DC | PRN
  Administered 2016-04-21: 100 mg via RECTAL

## 2016-04-21 MED ORDER — ROCURONIUM BROMIDE 10 MG/ML (PF) SYRINGE
PREFILLED_SYRINGE | INTRAVENOUS | Status: DC | PRN
  Administered 2016-04-21: 40 mg via INTRAVENOUS

## 2016-04-21 MED ORDER — PHENYLEPHRINE 40 MCG/ML (10ML) SYRINGE FOR IV PUSH (FOR BLOOD PRESSURE SUPPORT)
PREFILLED_SYRINGE | INTRAVENOUS | Status: DC | PRN
  Administered 2016-04-21: 80 ug via INTRAVENOUS

## 2016-04-21 MED ORDER — INDOMETHACIN 50 MG RE SUPP: 100.0000 mg | Freq: Once | RECTAL | Status: DC

## 2016-04-21 MED ORDER — CIPROFLOXACIN IN D5W 400 MG/200ML IV SOLN
400.0000 mg | Freq: Once | INTRAVENOUS | Status: AC
  Administered 2016-04-21: 400 mg via INTRAVENOUS

## 2016-04-21 MED ORDER — ONDANSETRON HCL 4 MG/2ML IJ SOLN
INTRAMUSCULAR | Status: DC | PRN
  Administered 2016-04-21: 4 mg via INTRAVENOUS

## 2016-04-21 MED ORDER — PROPOFOL 10 MG/ML IV BOLUS
INTRAVENOUS | Status: DC | PRN
  Administered 2016-04-21: 150 mg via INTRAVENOUS

## 2016-04-21 MED ORDER — LIDOCAINE 2% (20 MG/ML) 5 ML SYRINGE
INTRAMUSCULAR | Status: DC | PRN
  Administered 2016-04-21: 80 mg via INTRAVENOUS

## 2016-04-21 MED ORDER — INDOMETHACIN 50 MG RE SUPP
RECTAL | Status: AC
  Filled 2016-04-21: qty 2

## 2016-04-21 NOTE — Discharge Instructions (Signed)
YOU HAD AN ENDOSCOPIC PROCEDURE TODAY: Refer to the procedure report and other information in the discharge instructions given to you for any specific questions about what was found during the examination. If this information does not answer your questions, please call [place] office at [phone] to clarify.  ° °YOU SHOULD EXPECT: Some feelings of bloating in the abdomen. Passage of more gas than usual. Walking can help get rid of the air that was put into your GI tract during the procedure and reduce the bloating. If you had a lower endoscopy (such as a colonoscopy or flexible sigmoidoscopy) you may notice spotting of blood in your stool or on the toilet paper. Some abdominal soreness may be present for a day or two, also. ° °DIET: Your first meal following the procedure should be a light meal and then it is ok to progress to your normal diet. A half-sandwich or bowl of soup is an example of a good first meal. Heavy or fried foods are harder to digest and may make you feel nauseous or bloated. Drink plenty of fluids but you should avoid alcoholic beverages for 24 hours. If you had a esophageal dilation, please see attached instructions for diet.   ° °ACTIVITY: Your care partner should take you home directly after the procedure. You should plan to take it easy, moving slowly for the rest of the day. You can resume normal activity the day after the procedure however YOU SHOULD NOT DRIVE, use power tools, machinery or perform tasks that involve climbing or major physical exertion for 24 hours (because of the sedation medicines used during the test).  ° °SYMPTOMS TO REPORT IMMEDIATELY: °A gastroenterologist can be reached at any hour. Please call [phone]  for any of the following symptoms:  °Following lower endoscopy (colonoscopy, flexible sigmoidoscopy) °Excessive amounts of blood in the stool  °Significant tenderness, worsening of abdominal pains  °Swelling of the abdomen that is new, acute  °Fever of 100° or  higher  °Following upper endoscopy (EGD, EUS, ERCP, esophageal dilation) °Vomiting of blood or coffee ground material  °New, significant abdominal pain  °New, significant chest pain or pain under the shoulder blades  °Painful or persistently difficult swallowing  °New shortness of breath  °Black, tarry-looking or red, bloody stools ° °FOLLOW UP:  °If any biopsies were taken you will be contacted by phone or by letter within the next 1-3 weeks. Call [phone]  if you have not heard about the biopsies in 3 weeks.  °Please also call with any specific questions about appointments or follow up tests. ° °

## 2016-04-21 NOTE — Anesthesia Postprocedure Evaluation (Signed)
Anesthesia Post Note  Patient: Raymond [name]  Procedure(s) Performed: Procedure(s) (LRB): ESOPHAGOGASTRODUODENOSCOPY (EGD) (N/A) ENDOSCOPIC RETROGRADE CHOLANGIOPANCREATOGRAPHY (ERCP) (N/A)  Patient location during evaluation: PACU Anesthesia Type: General Level of consciousness: awake Pain management: pain level controlled Respiratory status: spontaneous breathing Cardiovascular status: stable Anesthetic complications: no       Last Vitals:  Vitals:   04/21/16 1510 04/21/16 1520  BP: (!) 154/88 (!) 159/64  Pulse: (!) 53 (!) 59  Resp: 13 17  Temp:      Last Pain:  Vitals:   04/21/16 1430  TempSrc: Oral                 Sarafina Puthoff

## 2016-04-21 NOTE — Transfer of Care (Signed)
Immediate Anesthesia Transfer of Care Note  Patient: [name]  Procedure(s) Performed: Procedure(s): ESOPHAGOGASTRODUODENOSCOPY (EGD) (N/A) ENDOSCOPIC RETROGRADE CHOLANGIOPANCREATOGRAPHY (ERCP) (N/A)  Patient Location: Endoscopy Unit  Anesthesia Type:General  Level of Consciousness: awake, alert , oriented and patient cooperative  Airway & Oxygen Therapy: Patient Spontanous Breathing and Patient connected to nasal cannula oxygen  Post-op Assessment: Report given to RN, Post -op Vital signs reviewed and stable and Patient moving all extremities X 4  Post vital signs: Reviewed and stable  Last Vitals:  Vitals:   04/21/16 1141  BP: 114/62  Pulse: (!) 57  Resp: 20    Last Pain:  Vitals:   04/21/16 1141  TempSrc: Oral         Complications: No apparent anesthesia complications

## 2016-04-21 NOTE — Anesthesia Preprocedure Evaluation (Addendum)
Anesthesia Evaluation  Patient identified by MRN, date of birth, ID band  Reviewed: Allergy & Precautions, NPO status , Patient's Chart, lab work & pertinent test results  Airway Mallampati: II  TM Distance: >3 FB Neck ROM: Full    Dental  (+) Upper Dentures, Dental Advisory Given   Pulmonary neg pulmonary ROS, former smoker,    breath sounds clear to auscultation       Cardiovascular hypertension, + Past MI and + Peripheral Vascular Disease   Rhythm:Regular Rate:Normal     Neuro/Psych negative neurological ROS     GI/Hepatic PUD, GERD  ,History noted. CH+G   Endo/Other  Hypothyroidism   Renal/GU Renal disease     Musculoskeletal  (+) Arthritis ,   Abdominal   Peds  Hematology  (+) anemia ,   Anesthesia Other Findings   Reproductive/Obstetrics                            Anesthesia Physical Anesthesia Plan  ASA: III  Anesthesia Plan: General   Post-op Pain Management:    Induction: Intravenous  Airway Management Planned: Oral ETT  Additional Equipment:   Intra-op Plan:   Post-operative Plan: Possible Post-op intubation/ventilation  Informed Consent: I have reviewed the patients History and Physical, chart, labs and discussed the procedure including the risks, benefits and alternatives for the proposed anesthesia with the patient or authorized representative who has indicated his/her understanding and acceptance.   Dental advisory given  Plan Discussed with: CRNA and Anesthesiologist  Anesthesia Plan Comments:         Anesthesia Quick Evaluation

## 2016-04-21 NOTE — Op Note (Signed)
[facility], [place] Patient Name: Raymond [name] Procedure Date : 04/21/2016 MRN: [id number] Attending MD: Estill Cotta. Loletha Carrow , MD Date of Birth: 1926-08-22 CSN: [id number] Age: 83 Admit Type: Outpatient Procedure:                Upper GI endoscopy Indications:              Follow-up of esophageal stenosis Providers:                Mallie Mussel L. Loletha Carrow, MD, Carolynn Comment, RN, Cherylynn Ridges, Technician Referring MD:              Medicines:                General Anesthesia (for ERCP - see additional                            report) Complications:            No immediate complications. Estimated Blood Loss:     Estimated blood loss: none. Procedure:                Pre-Anesthesia Assessment:                           - Prior to the procedure, a History and Physical                            was performed, and patient medications and                            allergies were reviewed. The patient's tolerance of                            previous anesthesia was also reviewed. The risks                            and benefits of the procedure and the sedation                            options and risks were discussed with the patient.                            All questions were answered, and informed consent                            was obtained. Prior Anticoagulants: The patient has                            taken aspirin, last dose was 5 days prior to                            procedure. ASA Grade Assessment: III - A patient  with severe systemic disease. After reviewing the                            risks and benefits, the patient was deemed in                            satisfactory condition to undergo the procedure.                           After obtaining informed consent, the endoscope was                            passed under direct vision. Throughout the                            procedure, the patient's blood  pressure, pulse, and                            oxygen saturations were monitored continuously. The                            [id number]-[id number] ([id number]) scope was introduced through the                            mouth, and advanced to the second part of duodenum.                            The upper GI endoscopy was accomplished without                            difficulty. The patient tolerated the procedure                            well. Scope In: Scope Out: Findings:      The lower third of the esophagus was moderately tortuous. There was no       longer a stricture, and the scope passed without resistance. Thus, no       dilation was performed.      The stomach was normal.      The cardia and gastric fundus were normal on retroflexion.      The examined duodenum was normal. Impression:               - Tortuous esophagus.                           - Normal stomach.                           - Normal examined duodenum.                           - No specimens collected. Moderate Sedation:      GETA Recommendation:           - Patient has a contact number available for  emergencies. The signs and symptoms of potential                            delayed complications were discussed with the                            patient. Return to normal activities tomorrow.                            Written discharge instructions were provided to the                            patient.                           - Resume previous diet.                           - Continue present medications.                           - See the other procedure note for documentation of                            additional recommendations. Procedure Code(s):        --- Professional ---                           [id number], Esophagogastroduodenoscopy, flexible,                            transoral; diagnostic, including collection of                            specimen(s) by brushing or  washing, when performed                            (separate procedure) Diagnosis Code(s):        --- Professional ---                           Q39.9, Congenital malformation of esophagus,                            unspecified                           K22.2, Esophageal obstruction CPT copyright 2016 American Medical Association. All rights reserved. The codes documented in this report are preliminary and upon coder review may  be revised to meet current compliance requirements. Laisa Larrick L. Loletha Carrow, MD 04/21/2016 2:40:02 PM This report has been signed electronically. Number of Addenda: 0

## 2016-04-21 NOTE — Interval H&P Note (Signed)
History and Physical Interval Note:  04/21/2016 12:29 PM  Raymond [name]  has presented today for surgery, with the diagnosis of CBD stone  The various methods of treatment have been discussed with the patient and family. After consideration of risks, benefits and other options for treatment, the patient has consented to  Procedure(s): ESOPHAGOGASTRODUODENOSCOPY (EGD) (N/A) BALLOON DILATION (N/A) ENDOSCOPIC RETROGRADE CHOLANGIOPANCREATOGRAPHY (ERCP) (N/A) SPYGLASS CHOLANGIOSCOPY (N/A) as a surgical intervention .  The patient's history has been reviewed, patient examined, no change in status, stable for surgery.  I have reviewed the patient's chart and labs.  Questions were answered to the patient's satisfaction.     [name]

## 2016-04-21 NOTE — Op Note (Signed)
[facility] Patient Name: Raymond [name] Procedure Date : 04/21/2016 MRN: [id number] Attending MD: Estill Cotta. Loletha Carrow , MD Date of Birth: March 26, 1926 CSN: [id number] Age: 83 Admit Type: Outpatient Procedure:                ERCP Indications:              For therapy of bile duct stone(s), Biliary stent                            removal Providers:                Mallie Mussel L. Loletha Carrow, MD, Carolynn Comment, RN, Cherylynn Ridges, Technician Referring MD:              Medicines:                General Anesthesia, Cipro 400 mg IV, Indomethacin                            782 mg PR Complications:            No immediate complications. Estimated Blood Loss:     Estimated blood loss: none. Procedure:                Pre-Anesthesia Assessment:                           - Prior to the procedure, a History and Physical                            was performed, and patient medications and                            allergies were reviewed. The patient's tolerance of                            previous anesthesia was also reviewed. The risks                            and benefits of the procedure and the sedation                            options and risks were discussed with the patient.                            All questions were answered, and informed consent                            was obtained. Prior Anticoagulants: The patient has                            taken aspirin, last dose was 5 days prior to  procedure. ASA Grade Assessment: III - A patient                            with severe systemic disease. After reviewing the                            risks and benefits, the patient was deemed in                            satisfactory condition to undergo the procedure.                           - After reviewing the risks and benefits, the                            patient was deemed in satisfactory condition to          undergo the procedure.                           - The anesthesia plan was to use general anesthesia.                           After obtaining informed consent, the scope was                            passed under direct vision. Throughout the                            procedure, the patient's blood pressure, pulse, and                            oxygen saturations were monitored continuously. The                            SW-[id number] ([id number]) scope was introduced through                            the mouth, and used to inject contrast into and                            used to inject contrast into the bile duct. The                            ERCP was performed with difficulty due to a                            tortuous distal CBD. The patient tolerated the                            procedure well. Scope In: Scope Out: Findings:      A biliary stent was visible on the scout film. The esophagus was       successfully intubated under direct vision. The scope was advanced to a  the major papilla in the descending duodenum without detailed       examination of the pharynx, larynx and associated structures, and upper       GI tract (see separate EGD report). There was mild resistance passing       the scope through the EGJ due to esophageal tortuosity. The upper GI       tract was grossly normal otherwise. Bile was seen flowing from the       plastic biliary stent, and the stent was removed intact with a snare and       sent for cytology. 0.035 inch x 260 cm straight Hydra Jagwire was passed       into the biliary tree. The 12 mm balloon was passed over the guidewire       and the bile duct was then deeply cannulated. Contrast was injected. I       personally interpreted the bile duct images. There was brisk flow of       contrast through the ducts. Image quality was excellent.Total fluroscopy       time was 7 min and 2 sec. Contrast extended to the hepatic ducts. The        main bile duct was diffusely dilated. The largest diameter was 12 mm.       The main bile duct contained no stones or strictures. Dilation of the       most distal 2 cm of the main bile duct with a 4 mm balloon dilator was       successful. That was done because the 31mm balloon would not come through       the ampulla on the first pass. To discover objects, the biliary tree was       swept several times with a 9-12 mm balloon starting at the bifurcation.       Debris was swept from the duct. There was excellent bile flow throughout       the procedure.      The ampulla was small, and could not accommodate a longer sphincterotomy.      Also, as previously noted, the distal third of the CBD was tortuous       (most likely from an adjacent duodenal diverticulum). That appears to be       why it was difficult to pull a 45mm balloon through the ampulla. It was       eventually possible with difficulty after the balloon dilation of the       most distal CBD as noted above. Impression:               - The entire main bile duct was dilated.                           - The lower third of the main bile duct was                            successfully dilated.                           - One stent was removed from the biliary tree.                           - The biliary tree was swept and debris was  found. Moderate Sedation:      GETA Recommendation:           - Refer to a surgeon at the next available                            appointment.                           This patient is at high risk of having recurrent                            retained CBD stones due to GB stones and biliary                            anatomy as described above.                           This was the second of two challenging ERCPs on                            this patient. Procedure Code(s):        --- Professional ---                           [id number], Endoscopic retrograde                             cholangiopancreatography (ERCP); with removal of                            foreign body(s) or stent(s) from biliary/pancreatic                            duct(s)                           43264, Endoscopic retrograde                            cholangiopancreatography (ERCP); with removal of                            calculi/debris from biliary/pancreatic duct(s)                           15726, Endoscopic catheterization of the biliary                            ductal system, radiological supervision and                            interpretation Diagnosis Code(s):        --- Professional ---                           Z46.59, Encounter for fitting and adjustment of  other gastrointestinal appliance and device                           K80.50, Calculus of bile duct without cholangitis                            or cholecystitis without obstruction                           K83.8, Other specified diseases of biliary tract CPT copyright 2016 American Medical Association. All rights reserved. The codes documented in this report are preliminary and upon coder review may  be revised to meet current compliance requirements. Eladio Dentremont L. Loletha Carrow, MD 04/21/2016 2:56:44 PM This report has been signed electronically. Number of Addenda: 0

## 2016-04-21 NOTE — H&P (Signed)
History:  This patient presents for endoscopic testing for CBD stone.  [name] Referring physician: Wende Neighbors, MD  Past Medical History: Past Medical History:  Diagnosis Date  . Anemia   . ASCVD (arteriosclerotic cardiovascular disease)    2 MI's in 1990s  . Chronic renal insufficiency, stage II (mild)    CrCl in the 60s  . Dementia   . DJD (degenerative joint disease)   . Esophageal dysphagia    Secondary to Schatzki's ring with dilation x2, most recently in 06/09, negative for H. pylori  . Gallstones   . GERD (gastroesophageal reflux disease)   . HTN (hypertension)    BP meds stopped 2016 due to recurrent orthostatic hypotension  . Hyperlipidemia   . Hypothyroidism   . Mild cognitive impairment with memory loss   . Myocardial infarction 08-1996   Cardiac cath done but no other intervention that we know of  . Orthostatic hypotension   . Psoriasis   . Thrombocytopenia ([place])    Platelets 99K on 09/2009 labs  . Ulcerative proctitis ([place])    Dx: 2000, started Asacol at that time.  Was taken off asacol 2014 and has done fine since (Dr. Sydell Axon, [facility])     Past Surgical History: Past Surgical History:  Procedure Laterality Date  . APPENDECTOMY  1940  . CLEFT LIP REPAIR     As a child  . COLECTOMY  2004   Colonovesicular fistula secondary to diverticulitis requiring sigmoid colectomy and bladder repair  . COLONOSCOPY  06/2007   Dr. Olegario Messier: Few diverticula, normal anastomosiss/p sigmoid colectomy for diverticulitis  . colovesicular fistula  2004  . ERCP N/A 03/07/2016   Procedure: ENDOSCOPIC RETROGRADE CHOLANGIOPANCREATOGRAPHY (ERCP);  Surgeon: Doran Stabler, MD;  Location: [facility] ENDOSCOPY;  Service: Endoscopy;  Laterality: N/A;  . ESOPHAGEAL DILATION  06-23-07; 06/28/12   H pylori NEG.  2014-reactive gastropathy with surface erosion.  . ESOPHAGOGASTRODUODENOSCOPY  06/2007   Dr. Olegario Messier: symptomatic Schatzki ring, s/p 54mm Savary dilator, Erosive  duodenitis, s/p bx   . ESOPHAGOGASTRODUODENOSCOPY (EGD) WITH ESOPHAGEAL DILATION N/A 06/28/2012   [id number] cervical web and distal esophageal s/p dilation/Hiatal hernia. Antral erosions (reactive gastropathy, no H.pylori)  . HERNIA REPAIR    . NECK MASS EXCISION  1979    Allergies: No Known Allergies  Outpatient Meds: Current Facility-Administered Medications  Medication Dose Route Frequency Provider Last Rate Last Dose  . 0.9 %  sodium chloride infusion   Intravenous Continuous [name], MD      . ciprofloxacin (CIPRO) IVPB 400 mg  400 mg Intravenous Once [name], MD      . indomethacin (INDOCIN) 50 MG suppository 100 mg  100 mg Rectal Once [name], MD      . lactated ringers infusion   Intravenous Continuous [name], MD 10 mL/hr at 04/21/16 1200 1,000 mL at 04/21/16 1200      ___________________________________________________________________ Objective   Exam:  BP 114/62   Pulse (!) 57   Resp 20   Ht 5\' 8"  (1.727 m)   Wt 137 lb (62.1 kg)   SpO2 99%   BMI 20.83 kg/m    CV: RRR without murmur, S1/S2, no JVD, no peripheral edema  Resp: clear to auscultation bilaterally, normal RR and effort noted  GI: soft, no tenderness, with active bowel sounds. No guarding or palpable organomegaly noted.  Neuro: awake, alert and oriented x 3. Normal gross motor function and  fluent speech   Assessment:  CBD stone.  No jaundice at present (LFTS normal last week), stent in place x 6 weeks Esophageal stricture  Plan:  EGD with probable balloon dilation, then ERCP with stent removal and stone extraction and cholangioscopy (possible EHL)   The benefits and risks of the planned procedure were described in detail with the patient or (when appropriate) their health care proxy.  Risks were outlined as including, but not limited to, bleeding, infection, perforation, adverse medication reaction leading to cardiac or pulmonary decompensation, or  pancreatitis (if ERCP).  The limitation of incomplete mucosal visualization was also discussed.  No guarantees or warranties were given.  Consent from POA/Health care proxy.  She and the patient's sone were both present.   [name]

## 2016-04-21 NOTE — Anesthesia Procedure Notes (Signed)
Procedure Name: Intubation Date/Time: 04/21/2016 1:02 PM Performed by: Julieta Bellini Pre-anesthesia Checklist: Patient identified, Emergency Drugs available, Suction available and Patient being monitored Patient Re-evaluated:Patient Re-evaluated prior to inductionOxygen Delivery Method: Circle system utilized Preoxygenation: Pre-oxygenation with 100% oxygen Intubation Type: IV induction Ventilation: Mask ventilation without difficulty Laryngoscope Size: Mac and 4 Grade View: Grade I Tube type: Oral Tube size: 7.5 mm Number of attempts: 1 Airway Equipment and Method: Stylet Placement Confirmation: ETT inserted through vocal cords under direct vision,  positive ETCO2 and breath sounds checked- equal and bilateral Secured at: 23 cm Tube secured with: Tape Dental Injury: Teeth and Oropharynx as per pre-operative assessment

## 2016-04-22 ENCOUNTER — Telehealth: Payer: Self-pay

## 2016-04-22 NOTE — Telephone Encounter (Signed)
-----   Message from [place], MD sent at 04/21/2016  3:05 PM EDT ----- Please send a referral to [facility] for outpatient surgical consult on this patient:  Gallstones and recent choledocholithiasis. Would need to send my hospital consult from Feb 2018 and bth of my ERCP reports.  When we know what surgeon he will see, please let me know so I can give them some information about him.  - HD

## 2016-04-22 NOTE — Telephone Encounter (Signed)
Referral faxed to [facility], asked that they contact patient with appointment. Also requested that they contact our office date/time/who patient will be seeing.

## 2016-04-23 ENCOUNTER — Telehealth: Payer: Self-pay

## 2016-04-23 ENCOUNTER — Encounter (HOSPITAL_COMMUNITY): Payer: Self-pay | Admitting: Gastroenterology

## 2016-04-23 NOTE — Telephone Encounter (Signed)
Patient is scheduled to see Dr. Autumn Messing on 4/30 at 10:10. [facility] was unable to contact patient, called [name], lvm for her re: appointment. Asked that she call their office to confirm appointment, also asked that she contact our office to confirm she received my message.

## 2016-04-23 NOTE — Telephone Encounter (Signed)
-----   Message from [place], MD sent at 04/21/2016  3:05 PM EDT ----- Please send a referral to [facility] for outpatient surgical consult on this patient:  Gallstones and recent choledocholithiasis. Would need to send my hospital consult from Feb 2018 and bth of my ERCP reports.  When we know what surgeon he will see, please let me know so I can give them some information about him.  - HD

## 2016-04-26 DIAGNOSIS — I129 Hypertensive chronic kidney disease with stage 1 through stage 4 chronic kidney disease, or unspecified chronic kidney disease: Secondary | ICD-10-CM | POA: Diagnosis not present

## 2016-04-26 DIAGNOSIS — Z48815 Encounter for surgical aftercare following surgery on the digestive system: Secondary | ICD-10-CM | POA: Diagnosis not present

## 2016-04-27 DIAGNOSIS — Z48815 Encounter for surgical aftercare following surgery on the digestive system: Secondary | ICD-10-CM | POA: Diagnosis not present

## 2016-04-27 DIAGNOSIS — I129 Hypertensive chronic kidney disease with stage 1 through stage 4 chronic kidney disease, or unspecified chronic kidney disease: Secondary | ICD-10-CM | POA: Diagnosis not present

## 2016-05-20 DIAGNOSIS — D2372 Other benign neoplasm of skin of left lower limb, including hip: Secondary | ICD-10-CM | POA: Diagnosis not present

## 2016-05-20 DIAGNOSIS — M79672 Pain in left foot: Secondary | ICD-10-CM | POA: Diagnosis not present

## 2016-05-20 DIAGNOSIS — M7732 Calcaneal spur, left foot: Secondary | ICD-10-CM | POA: Diagnosis not present

## 2016-05-24 DIAGNOSIS — K8020 Calculus of gallbladder without cholecystitis without obstruction: Secondary | ICD-10-CM | POA: Diagnosis not present

## 2016-06-14 ENCOUNTER — Ambulatory Visit: Payer: Medicare Other | Admitting: Family Medicine

## 2016-06-14 ENCOUNTER — Ambulatory Visit (INDEPENDENT_AMBULATORY_CARE_PROVIDER_SITE_OTHER): Payer: Medicare Other | Admitting: Family Medicine

## 2016-06-14 ENCOUNTER — Encounter: Payer: Self-pay | Admitting: Family Medicine

## 2016-06-14 VITALS — BP 124/82 | HR 68 | Temp 98.4°F | Resp 16 | Ht 68.0 in | Wt 139.0 lb

## 2016-06-14 DIAGNOSIS — G301 Alzheimer's disease with late onset: Secondary | ICD-10-CM | POA: Diagnosis not present

## 2016-06-14 DIAGNOSIS — R54 Age-related physical debility: Secondary | ICD-10-CM

## 2016-06-14 DIAGNOSIS — R2681 Unsteadiness on feet: Secondary | ICD-10-CM | POA: Diagnosis not present

## 2016-06-14 DIAGNOSIS — R296 Repeated falls: Secondary | ICD-10-CM | POA: Diagnosis not present

## 2016-06-14 DIAGNOSIS — F0281 Dementia in other diseases classified elsewhere with behavioral disturbance: Secondary | ICD-10-CM

## 2016-06-14 DIAGNOSIS — I10 Essential (primary) hypertension: Secondary | ICD-10-CM

## 2016-06-14 DIAGNOSIS — F02818 Dementia in other diseases classified elsewhere, unspecified severity, with other behavioral disturbance: Secondary | ICD-10-CM

## 2016-06-14 NOTE — Progress Notes (Signed)
Chief Complaint  Patient presents with  . Hypertension   New patient, pleasant , but significant dementia. Here with girlfriend of 2 years and now primary caregiver (Lula, "Raymond [name]") Also here is her daughter for support and to provide additional history (Raymond [name]). They express concern about his dementia management and medicines and want a review.  He has been slowly declining for about 3 years.  Most of the time easily managed, but at times stubborn and uncooperative with caregivers.  No harm to self to others, no wandering, no dangerous behaviors.  Sleeps well.  Only remembers to dress and eat if supervised.  Takes his medicines.  Resists bathing.   Raymond [name] has her own home but has lived with Raymond [name] for the last couple of years.  They have an aid for a couple of hours 3 times a week, but he does not like Lou to leave and calls her repeatedly.  He does not recognize her as Raymond [name] anymore, but calls her "Momma" and acts like she is his mother, talks to her about Raymond [name] as she is not there. He goes to adult day care 2 d a week.  Likes to go on car rides. BP runs low - he is on both atenolol and amlodipine.  They forgot to give him his meds today , so it is normal.    They feel his nutrition is adequate.  He has gained 2 pounds since last weighed, but overall is down 25+lbs from his normal adult weight (167 in 2016, now 139). No problem with heartburn or swallowing He picks at his psoriasis and makes it bleed, even if kept lotioned Family thinks he is up to date with immunizations and all needed routine testing PCP records are requested  Patient Active Problem List   Diagnosis Date Noted  . Gait instability 06/14/2016  . Aortic valve calcification 04/07/2016  . History of myocardial infarction 04/07/2016  . GERD (gastroesophageal reflux disease) 10/04/2014  . Dementia 04/16/2013  . Hyperlipidemia 04/16/2013  . Esophageal dysphagia 06/23/2012  . Essential hypertension 04/08/2012  . Hypothyroidism  10/15/2009  . Psoriasis 10/15/2009  . PAD (peripheral artery disease) ([place]) 09/11/2009  . Benign esophageal stricture 09/11/2009  . DIVERTICULAR DISEASE 09/11/2009    Outpatient Encounter Prescriptions as of 06/14/2016  Medication Sig  . acetaminophen (TYLENOL) 325 MG tablet Take 2 tablets (650 mg total) by mouth every 6 (six) hours as needed for moderate pain.  Marland Kitchen amLODipine (NORVASC) 2.5 MG tablet Take 1 tablet (2.5 mg total) by mouth daily.  Marland Kitchen aspirin 81 MG tablet Take 81 mg by mouth daily.    Marland Kitchen atorvastatin (LIPITOR) 20 MG tablet TAKE ONE-HALF TABLET BY MOUTH ONCE DAILY  . levothyroxine (SYNTHROID, LEVOTHROID) 112 MCG tablet TAKE ONE TABLET BY MOUTH ONCE DAILY BEFORE BREAKFAST  . niacin 500 MG CR capsule Take 2 capsules (1,000 mg total) by mouth daily.  . nitroGLYCERIN (NITROSTAT) 0.4 MG SL tablet Place 1 tablet (0.4 mg total) under the tongue every 5 (five) minutes as needed for chest pain.  . pantoprazole (PROTONIX) 40 MG tablet TAKE ONE TABLET BY MOUTH ONCE DAILY BEFORE BREAKFAST  . risperiDONE (RISPERDAL) 0.5 MG tablet Take 1 tablet (0.5 mg total) by mouth 2 (two) times daily.   No facility-administered encounter medications on file as of 06/14/2016.     Past Medical History:  Diagnosis Date  . Anemia   . Anxiety   . ASCVD (arteriosclerotic cardiovascular disease)    2 MI's in 1990s  . Cataract   .  Chronic renal insufficiency, stage II (mild)    CrCl in the 60s  . Dementia   . Depression   . DJD (degenerative joint disease)   . Esophageal dysphagia    Secondary to Schatzki's ring with dilation x2, most recently in 06/09, negative for H. pylori  . Gallstones   . GERD (gastroesophageal reflux disease)   . HTN (hypertension)    BP meds stopped 2016 due to recurrent orthostatic hypotension  . Hyperlipidemia   . Hypothyroidism   . Mild cognitive impairment with memory loss   . Myocardial infarction [facility]) [id number]   Cardiac cath done but no other intervention that we know of    . Orthostatic hypotension   . Psoriasis   . Thrombocytopenia ([place])    Platelets 99K on 09/2009 labs  . Ulcerative proctitis ([place])    Dx: 2000, started Asacol at that time.  Was taken off asacol 2014 and has done fine since (Dr. Sydell Axon, [facility])    Past Surgical History:  Procedure Laterality Date  . APPENDECTOMY  1940  . CLEFT LIP REPAIR     As a child  . COLECTOMY  2004   Colonovesicular fistula secondary to diverticulitis requiring sigmoid colectomy and bladder repair  . COLONOSCOPY  06/2007   Dr. Olegario Messier: Few diverticula, normal anastomosiss/p sigmoid colectomy for diverticulitis  . colovesicular fistula  2004  . ERCP N/A 03/07/2016   Procedure: ENDOSCOPIC RETROGRADE CHOLANGIOPANCREATOGRAPHY (ERCP);  Surgeon: Doran Stabler, MD;  Location: [facility] ENDOSCOPY;  Service: Endoscopy;  Laterality: N/A;  . ERCP N/A 04/21/2016   Procedure: ENDOSCOPIC RETROGRADE CHOLANGIOPANCREATOGRAPHY (ERCP);  Surgeon: Doran Stabler, MD;  Location: [place];  Service: Gastroenterology;  Laterality: N/A;  . ESOPHAGEAL DILATION  06-23-07; 06/28/12   H pylori NEG.  2014-reactive gastropathy with surface erosion.  . ESOPHAGOGASTRODUODENOSCOPY  06/2007   Dr. Olegario Messier: symptomatic Schatzki ring, s/p 58mm Savary dilator, Erosive duodenitis, s/p bx   . ESOPHAGOGASTRODUODENOSCOPY N/A 04/21/2016   Procedure: ESOPHAGOGASTRODUODENOSCOPY (EGD);  Surgeon: Doran Stabler, MD;  Location: [place];  Service: Gastroenterology;  Laterality: N/A;  . ESOPHAGOGASTRODUODENOSCOPY (EGD) WITH ESOPHAGEAL DILATION N/A 06/28/2012   JOA:[id number] cervical web and distal esophageal s/p dilation/Hiatal hernia. Antral erosions (reactive gastropathy, no H.pylori)  . HERNIA REPAIR    . NECK MASS EXCISION  1979    Social History   Social History  . Marital status: Widowed    Spouse name: N/A  . Number of children: 2  . Years of education: N/A   Occupational History  . Retired from [employer]  . Smoking status: Former Smoker    Packs/day: 1.00    Years: 10.00    Types: Cigarettes    Quit date: 01/11/1970  . Smokeless tobacco: Never Used     Comment: Minimal use at 20  . Alcohol use No  . Drug use: No  . Sexual activity: Not Currently   Other Topics Concern  . Not on file   Social History Narrative   Widowed, has had girlfriend x 93 yrs, Lives in [place],    [place] one son who lives in [place].   Occupation: [employer] in [place].  Retired 59s.   No regular exercise.  Smoked in the WAY distant past.   Alcohol: none.     Girlfriend [name] "Raymond [name]" is living with him as primary caregiver and is POA       Family History  Problem Relation Age of  Onset  . Pneumonia Mother   . Depression Mother   . Cancer Father        pt doesn't remember type  . Heart disease Daughter        heart defect: d age 91    Review of Systems  Constitutional: Positive for malaise/fatigue and weight loss.       Sleeps a lot.  Weight loss discussed HPI  HENT: Positive for hearing loss. Negative for congestion and nosebleeds.   Eyes: Negative for blurred vision and double vision.       No complaint  Respiratory: Negative for cough and shortness of breath.   Cardiovascular: Negative for chest pain, palpitations and leg swelling.  Gastrointestinal: Negative for constipation, diarrhea and heartburn.  Genitourinary: Negative.  Negative for frequency.  Musculoskeletal: Positive for falls. Negative for back pain and neck pain.  Skin: Positive for rash.       psoriasis  Neurological: Positive for weakness.       Balance poor, falls backwards  Psychiatric/Behavioral: Positive for memory loss.       At times agitated, uncooperative    BP 124/82 (BP Location: Right Arm, Patient Position: Sitting, Cuff Size: Normal)   Pulse 68   Temp 98.4 F (36.9 C) (Temporal)   Resp 16   Ht 5\' 8"  (1.727 m)   Wt 139 lb (63 kg)   SpO2 98%   BMI 21.13 kg/m   Physical Exam    Constitutional: He appears well-developed. No distress.  Thin, balance poor, small steps.  NAD  HENT:  Head: Normocephalic.  Right Ear: External ear normal.  Left Ear: External ear normal.  Nose: Nose normal.  Mouth/Throat: Oropharynx is clear and moist.  Dentures, no uvula, both ears with mild cerumen - not impacted  Eyes: Conjunctivae are normal. Pupils are equal, round, and reactive to light.  Neck: Normal range of motion. No thyromegaly present.  Thyroidectomy scar  Cardiovascular: Normal rate, regular rhythm and normal heart sounds.   occas ectopy  Pulmonary/Chest: Effort normal. He has rales.  Faint rales bases  Abdominal: Soft. Bowel sounds are normal. There is no tenderness.  No HSM  Musculoskeletal: Normal range of motion. He exhibits no edema.  No focal weakness  Lymphadenopathy:    He has no cervical adenopathy.  Neurological: He is alert. He displays normal reflexes. Coordination abnormal.  Skin: Rash noted.  Psoriasis, mild, noted on elbows  Psychiatric: His behavior is normal. His speech is delayed. Thought content is delusional. He exhibits abnormal recent memory and abnormal remote memory.  Quiet.  Answers simple questions correctly, memory impaired.  Calls his friend "Momma". Sighs frequently He is inattentive.  ASSESSMENT/PLAN:  1. Late onset Alzheimer's disease with behavioral disturbance - Ambulatory referral to Home Health  2. Age-related physical debility   3. Falls frequently Start PT.  4. Gait instability   5. Essential hypertension Lower BP medicine  Greater than 50% of this visit was spent in counseling and coordinating care.  Total face to face time:  60 minutes.  Spoke to [place] and [employer] separately.  Discussed the Risperdal has a black box warning for increased mortality.  He needs this for quality of life and to stay home.  I am going to stop the aricept as there is little evidence it is effective.  See me in one month  Patient Instructions   Stop the aricept Take the tenormin 1/2 pill a day for a week then stop Continue other medicines Need records Dr hall  See me in one month   Raylene Everts, MD

## 2016-06-14 NOTE — Patient Instructions (Signed)
Stop the aricept Take the tenormin 1/2 pill a day for a week then stop Continue other medicines Need records Dr hall See me in one month

## 2016-06-18 ENCOUNTER — Encounter: Payer: Self-pay | Admitting: Family Medicine

## 2016-06-18 DIAGNOSIS — N182 Chronic kidney disease, stage 2 (mild): Secondary | ICD-10-CM | POA: Insufficient documentation

## 2016-07-15 ENCOUNTER — Ambulatory Visit: Payer: Medicare Other | Admitting: Family Medicine

## 2016-07-20 ENCOUNTER — Ambulatory Visit (INDEPENDENT_AMBULATORY_CARE_PROVIDER_SITE_OTHER): Payer: Medicare Other | Admitting: Family Medicine

## 2016-07-20 ENCOUNTER — Encounter: Payer: Self-pay | Admitting: Family Medicine

## 2016-07-20 VITALS — BP 116/68 | HR 80 | Temp 98.1°F | Resp 16 | Ht 68.0 in | Wt 136.1 lb

## 2016-07-20 DIAGNOSIS — F0281 Dementia in other diseases classified elsewhere with behavioral disturbance: Secondary | ICD-10-CM

## 2016-07-20 DIAGNOSIS — R2681 Unsteadiness on feet: Secondary | ICD-10-CM

## 2016-07-20 DIAGNOSIS — R54 Age-related physical debility: Secondary | ICD-10-CM

## 2016-07-20 DIAGNOSIS — G301 Alzheimer's disease with late onset: Secondary | ICD-10-CM

## 2016-07-20 DIAGNOSIS — R296 Repeated falls: Secondary | ICD-10-CM | POA: Diagnosis not present

## 2016-07-20 MED ORDER — PANTOPRAZOLE SODIUM 20 MG PO TBEC: 20.0000 mg | DELAYED_RELEASE_TABLET | Freq: Every day | ORAL | 3 refills | Status: DC

## 2016-07-20 NOTE — Patient Instructions (Signed)
We will see about the home PT for falls Stop the amlodipine  We will get lab tests before next visit See me  In 2-3 months  Call sooner for problems  Your next refill pantoprazole will be the 20 mg dose

## 2016-07-20 NOTE — Progress Notes (Signed)
Chief Complaint  Patient presents with  . Follow-up    1 month   Is off Aricept.  No change in mentation or behavior.  Has lost more weight Is off of atenolol.  BP still low Has had 2 more falls.  Never heard from PT, I referred a month ago. No problem behaviors to report Still goes to senior day care.  Has aid in the home.   Raymond [name] has no complaint at all His Sig Other "[name]" is here again today.  Patient Active Problem List   Diagnosis Date Noted  . CKD (chronic kidney disease) stage 2, GFR 60-89 ml/min 06/18/2016  . Gait instability 06/14/2016  . Aortic valve calcification 04/07/2016  . History of myocardial infarction 04/07/2016  . GERD (gastroesophageal reflux disease) 10/04/2014  . Dementia 04/16/2013  . Hyperlipidemia 04/16/2013  . Esophageal dysphagia 06/23/2012  . Essential hypertension 04/08/2012  . Hypothyroidism 10/15/2009  . Psoriasis 10/15/2009  . PAD (peripheral artery disease) ([place]) 09/11/2009  . Benign esophageal stricture 09/11/2009  . DIVERTICULAR DISEASE 09/11/2009    Outpatient Encounter Prescriptions as of 07/20/2016  Medication Sig  . acetaminophen (TYLENOL) 325 MG tablet Take 2 tablets (650 mg total) by mouth every 6 (six) hours as needed for moderate pain.  Marland Kitchen aspirin 81 MG tablet Take 81 mg by mouth daily.    Marland Kitchen atorvastatin (LIPITOR) 20 MG tablet TAKE ONE-HALF TABLET BY MOUTH ONCE DAILY  . levothyroxine (SYNTHROID, LEVOTHROID) 112 MCG tablet TAKE ONE TABLET BY MOUTH ONCE DAILY BEFORE BREAKFAST  . nitroGLYCERIN (NITROSTAT) 0.4 MG SL tablet Place 1 tablet (0.4 mg total) under the tongue every 5 (five) minutes as needed for chest pain.  . pantoprazole (PROTONIX) 40 MG tablet TAKE ONE TABLET BY MOUTH ONCE DAILY BEFORE BREAKFAST  . risperiDONE (RISPERDAL) 0.5 MG tablet Take 1 tablet (0.5 mg total) by mouth 2 (two) times daily.   No facility-administered encounter medications on file as of 07/20/2016.     No Known Allergies  Review of Systems  Unable  to perform ROS: Dementia   Per discussion with caregiver - 2 falls No illness No problem with bowels/elimination Mildly uncooperative at times Still ruminates about [name] being mother vs GF and resists leaving her   BP 116/68 (BP Location: Right Arm, Patient Position: Sitting, Cuff Size: Normal)   Pulse 80   Temp 98.1 F (36.7 C) (Temporal)   Resp 16   Ht 5\' 8"  (1.727 m)   Wt 136 lb 1.9 oz (61.7 kg)   SpO2 96%   BMI 20.70 kg/m   Physical Exam  Constitutional: He appears well-developed. No distress.  Thin, balance poor, small steps.  NAD  HENT:  Head: Normocephalic.  Mouth/Throat: Oropharynx is clear and moist.  Dentures  Eyes: Conjunctivae are normal. Pupils are equal, round, and reactive to light.  Neck: Normal range of motion. No thyromegaly present.  Thyroidectomy scar  Cardiovascular: Normal rate, regular rhythm and normal heart sounds.   occas ectopy  Pulmonary/Chest: Effort normal. He has rales.  Faint rales bases  Abdominal: Soft. Bowel sounds are normal. There is no tenderness.  Musculoskeletal: Normal range of motion. He exhibits no edema.  No focal weakness, general atrophy  Lymphadenopathy:    He has no cervical adenopathy.  Neurological: He is alert. He displays normal reflexes. Coordination abnormal.  Skin: Rash noted.  Psoriasis, mild, noted on elbows  Psychiatric: His behavior is normal. His speech is delayed. Thought content is delusional. He exhibits abnormal recent memory and abnormal  remote memory.  Quiet.  Answers simple questions correctly, memory impaired.  Calls his friend "Momma".  He is inattentive.    ASSESSMENT/PLAN:  1. Late onset Alzheimer's disease with behavioral disturbance unchanged  2. Age-related physical debility Needs PT  3. Falls frequently Needs PT  4. Gait instability    Patient Instructions  We will see about the home PT for falls Stop the amlodipine  We will get lab tests before next visit See me  In 2-3  months  Call sooner for problems  Your next refill pantoprazole will be the 20 mg dose   Raylene Everts, MD

## 2016-07-22 ENCOUNTER — Telehealth: Payer: Self-pay

## 2016-07-22 NOTE — Telephone Encounter (Signed)
Referral for home health faxed.

## 2016-07-23 ENCOUNTER — Telehealth: Payer: Self-pay | Admitting: Family Medicine

## 2016-07-23 NOTE — Telephone Encounter (Signed)
Meagan w/[facility] calling to let you know that they will be going out Monday to patient's home.  cb  [phone]

## 2016-07-23 NOTE — Telephone Encounter (Signed)
Noted  

## 2016-07-28 ENCOUNTER — Telehealth: Payer: Self-pay | Admitting: Family Medicine

## 2016-07-28 DIAGNOSIS — N182 Chronic kidney disease, stage 2 (mild): Secondary | ICD-10-CM | POA: Diagnosis not present

## 2016-07-28 DIAGNOSIS — Z87891 Personal history of nicotine dependence: Secondary | ICD-10-CM | POA: Diagnosis not present

## 2016-07-28 DIAGNOSIS — I2510 Atherosclerotic heart disease of native coronary artery without angina pectoris: Secondary | ICD-10-CM | POA: Diagnosis not present

## 2016-07-28 DIAGNOSIS — I129 Hypertensive chronic kidney disease with stage 1 through stage 4 chronic kidney disease, or unspecified chronic kidney disease: Secondary | ICD-10-CM | POA: Diagnosis not present

## 2016-07-28 DIAGNOSIS — K219 Gastro-esophageal reflux disease without esophagitis: Secondary | ICD-10-CM | POA: Diagnosis not present

## 2016-07-28 DIAGNOSIS — L409 Psoriasis, unspecified: Secondary | ICD-10-CM | POA: Diagnosis not present

## 2016-07-28 DIAGNOSIS — K222 Esophageal obstruction: Secondary | ICD-10-CM | POA: Diagnosis not present

## 2016-07-28 DIAGNOSIS — F419 Anxiety disorder, unspecified: Secondary | ICD-10-CM | POA: Diagnosis not present

## 2016-07-28 DIAGNOSIS — F329 Major depressive disorder, single episode, unspecified: Secondary | ICD-10-CM | POA: Diagnosis not present

## 2016-07-28 DIAGNOSIS — I358 Other nonrheumatic aortic valve disorders: Secondary | ICD-10-CM | POA: Diagnosis not present

## 2016-07-28 DIAGNOSIS — Z9181 History of falling: Secondary | ICD-10-CM | POA: Diagnosis not present

## 2016-07-28 DIAGNOSIS — F0281 Dementia in other diseases classified elsewhere with behavioral disturbance: Secondary | ICD-10-CM | POA: Diagnosis not present

## 2016-07-28 DIAGNOSIS — G301 Alzheimer's disease with late onset: Secondary | ICD-10-CM | POA: Diagnosis not present

## 2016-07-28 NOTE — Telephone Encounter (Signed)
Nicky Pugh called regarding patient, She states that at 9 am this morning, nurse from [facility] came out and checked BP, it was 104/60 CNA came at 3 and noticed he was a little dizzier than usual when standing, his BP was 76/54, 78/53, 99/75. [name] states she checked his BP just a few mins ago and it was 90/56 standing and 70/58 sitting.  She is aware neither Dr Moshe Cipro or Dr Meda Coffee are in the office. I advised her to stay with patient, assisting him anytime he has to stand up, and limiting the times he has to stand, I.e using a bedside urinal. I also advised her to push fluids and encourage patient to eat. Red flags (extreme dizziness, falling, loss of consciouness) given to call 911.  Please advise.

## 2016-07-29 ENCOUNTER — Telehealth: Payer: Self-pay | Admitting: Family Medicine

## 2016-07-29 NOTE — Telephone Encounter (Signed)
Spoke to Ms Vella Raring, she states patient is completely different today. BP in 120s/70s, not lethargic. Advised to call back for appointment if symptoms return.

## 2016-07-29 NOTE — Telephone Encounter (Signed)
Please call and see how Raymond [name] is doing today.  If he is symptomatic with his low BP - dizziness, falls, drowsiness, have him come see me tomorrow.  Make sure he is hydrated.

## 2016-07-29 NOTE — Telephone Encounter (Signed)
Raymond [name] came by office this morning to pick up handicap parking application and wanted you to know that she was concerned with BP.  She had taken yesterday around 3 (99/75) and earlier yesterday it had been 76/54.  There were meds that he was to discontinue but he took them (they were in a pill box), so this may be the reason for the BP not being stable.  This morning his BP was 122/73.  Also she wants to let you know that the nurse will be out to take BP tomorrow AM and the Physical Therapist will be there for PT session in the afternoon

## 2016-07-30 DIAGNOSIS — K219 Gastro-esophageal reflux disease without esophagitis: Secondary | ICD-10-CM | POA: Diagnosis not present

## 2016-07-30 DIAGNOSIS — F0281 Dementia in other diseases classified elsewhere with behavioral disturbance: Secondary | ICD-10-CM | POA: Diagnosis not present

## 2016-07-30 DIAGNOSIS — I2510 Atherosclerotic heart disease of native coronary artery without angina pectoris: Secondary | ICD-10-CM | POA: Diagnosis not present

## 2016-07-30 DIAGNOSIS — G301 Alzheimer's disease with late onset: Secondary | ICD-10-CM | POA: Diagnosis not present

## 2016-07-30 DIAGNOSIS — I129 Hypertensive chronic kidney disease with stage 1 through stage 4 chronic kidney disease, or unspecified chronic kidney disease: Secondary | ICD-10-CM | POA: Diagnosis not present

## 2016-07-30 DIAGNOSIS — N182 Chronic kidney disease, stage 2 (mild): Secondary | ICD-10-CM | POA: Diagnosis not present

## 2016-08-02 ENCOUNTER — Telehealth: Payer: Self-pay | Admitting: Family Medicine

## 2016-08-02 DIAGNOSIS — N182 Chronic kidney disease, stage 2 (mild): Secondary | ICD-10-CM | POA: Diagnosis not present

## 2016-08-02 DIAGNOSIS — I2510 Atherosclerotic heart disease of native coronary artery without angina pectoris: Secondary | ICD-10-CM | POA: Diagnosis not present

## 2016-08-02 DIAGNOSIS — K219 Gastro-esophageal reflux disease without esophagitis: Secondary | ICD-10-CM | POA: Diagnosis not present

## 2016-08-02 DIAGNOSIS — I129 Hypertensive chronic kidney disease with stage 1 through stage 4 chronic kidney disease, or unspecified chronic kidney disease: Secondary | ICD-10-CM | POA: Diagnosis not present

## 2016-08-02 DIAGNOSIS — G301 Alzheimer's disease with late onset: Secondary | ICD-10-CM | POA: Diagnosis not present

## 2016-08-02 DIAGNOSIS — F0281 Dementia in other diseases classified elsewhere with behavioral disturbance: Secondary | ICD-10-CM | POA: Diagnosis not present

## 2016-08-02 NOTE — Telephone Encounter (Signed)
Called danny, v/o given to start therapy this week.

## 2016-08-02 NOTE — Telephone Encounter (Signed)
Kasandra Knudsen, physical therapist from [facility] called and left message on nurse line regarding patient. He states he was sent out to evaluate patient on Friday for repeated falls. He states he needs verbal orders for patient to have home health physical therapy twice a week for one week, then three times a week for two weeks, then two times a week for three weeks, and finally once a week for one week. This will be for fall prevention, vestibular training, therapeutic exercise, and gait training. He wants to start this week.  Callback for Kasandra Knudsen is [phone] He states voice mail is secured and it is ok to leave message.

## 2016-08-03 ENCOUNTER — Telehealth: Payer: Self-pay

## 2016-08-03 NOTE — Telephone Encounter (Signed)
needs 2 meds refilled. Wants a call back

## 2016-08-04 DIAGNOSIS — N182 Chronic kidney disease, stage 2 (mild): Secondary | ICD-10-CM | POA: Diagnosis not present

## 2016-08-04 DIAGNOSIS — F0281 Dementia in other diseases classified elsewhere with behavioral disturbance: Secondary | ICD-10-CM | POA: Diagnosis not present

## 2016-08-04 DIAGNOSIS — I2510 Atherosclerotic heart disease of native coronary artery without angina pectoris: Secondary | ICD-10-CM | POA: Diagnosis not present

## 2016-08-04 DIAGNOSIS — I129 Hypertensive chronic kidney disease with stage 1 through stage 4 chronic kidney disease, or unspecified chronic kidney disease: Secondary | ICD-10-CM | POA: Diagnosis not present

## 2016-08-04 DIAGNOSIS — G301 Alzheimer's disease with late onset: Secondary | ICD-10-CM | POA: Diagnosis not present

## 2016-08-04 DIAGNOSIS — K219 Gastro-esophageal reflux disease without esophagitis: Secondary | ICD-10-CM | POA: Diagnosis not present

## 2016-08-06 DIAGNOSIS — N182 Chronic kidney disease, stage 2 (mild): Secondary | ICD-10-CM | POA: Diagnosis not present

## 2016-08-06 DIAGNOSIS — F0281 Dementia in other diseases classified elsewhere with behavioral disturbance: Secondary | ICD-10-CM | POA: Diagnosis not present

## 2016-08-06 DIAGNOSIS — I129 Hypertensive chronic kidney disease with stage 1 through stage 4 chronic kidney disease, or unspecified chronic kidney disease: Secondary | ICD-10-CM | POA: Diagnosis not present

## 2016-08-06 DIAGNOSIS — K219 Gastro-esophageal reflux disease without esophagitis: Secondary | ICD-10-CM | POA: Diagnosis not present

## 2016-08-06 DIAGNOSIS — I2510 Atherosclerotic heart disease of native coronary artery without angina pectoris: Secondary | ICD-10-CM | POA: Diagnosis not present

## 2016-08-06 DIAGNOSIS — G301 Alzheimer's disease with late onset: Secondary | ICD-10-CM | POA: Diagnosis not present

## 2016-08-06 MED ORDER — TRIAMCINOLONE 0.1 % CREAM:EUCERIN CREAM 1:1: 1.0000 "application " | TOPICAL_CREAM | Freq: Two times a day (BID) | CUTANEOUS | 1 refills | Status: DC | PRN

## 2016-08-06 NOTE — Telephone Encounter (Signed)
rx done, pt aware. 

## 2016-08-09 DIAGNOSIS — N182 Chronic kidney disease, stage 2 (mild): Secondary | ICD-10-CM | POA: Diagnosis not present

## 2016-08-09 DIAGNOSIS — G301 Alzheimer's disease with late onset: Secondary | ICD-10-CM | POA: Diagnosis not present

## 2016-08-09 DIAGNOSIS — F0281 Dementia in other diseases classified elsewhere with behavioral disturbance: Secondary | ICD-10-CM | POA: Diagnosis not present

## 2016-08-09 DIAGNOSIS — I2510 Atherosclerotic heart disease of native coronary artery without angina pectoris: Secondary | ICD-10-CM | POA: Diagnosis not present

## 2016-08-09 DIAGNOSIS — K219 Gastro-esophageal reflux disease without esophagitis: Secondary | ICD-10-CM | POA: Diagnosis not present

## 2016-08-09 DIAGNOSIS — I129 Hypertensive chronic kidney disease with stage 1 through stage 4 chronic kidney disease, or unspecified chronic kidney disease: Secondary | ICD-10-CM | POA: Diagnosis not present

## 2016-08-11 DIAGNOSIS — I2510 Atherosclerotic heart disease of native coronary artery without angina pectoris: Secondary | ICD-10-CM | POA: Diagnosis not present

## 2016-08-11 DIAGNOSIS — F0281 Dementia in other diseases classified elsewhere with behavioral disturbance: Secondary | ICD-10-CM | POA: Diagnosis not present

## 2016-08-11 DIAGNOSIS — N182 Chronic kidney disease, stage 2 (mild): Secondary | ICD-10-CM | POA: Diagnosis not present

## 2016-08-11 DIAGNOSIS — G301 Alzheimer's disease with late onset: Secondary | ICD-10-CM | POA: Diagnosis not present

## 2016-08-11 DIAGNOSIS — I129 Hypertensive chronic kidney disease with stage 1 through stage 4 chronic kidney disease, or unspecified chronic kidney disease: Secondary | ICD-10-CM | POA: Diagnosis not present

## 2016-08-11 DIAGNOSIS — K219 Gastro-esophageal reflux disease without esophagitis: Secondary | ICD-10-CM | POA: Diagnosis not present

## 2016-08-13 DIAGNOSIS — G301 Alzheimer's disease with late onset: Secondary | ICD-10-CM | POA: Diagnosis not present

## 2016-08-13 DIAGNOSIS — I129 Hypertensive chronic kidney disease with stage 1 through stage 4 chronic kidney disease, or unspecified chronic kidney disease: Secondary | ICD-10-CM | POA: Diagnosis not present

## 2016-08-13 DIAGNOSIS — I2510 Atherosclerotic heart disease of native coronary artery without angina pectoris: Secondary | ICD-10-CM | POA: Diagnosis not present

## 2016-08-13 DIAGNOSIS — K219 Gastro-esophageal reflux disease without esophagitis: Secondary | ICD-10-CM | POA: Diagnosis not present

## 2016-08-13 DIAGNOSIS — F0281 Dementia in other diseases classified elsewhere with behavioral disturbance: Secondary | ICD-10-CM | POA: Diagnosis not present

## 2016-08-13 DIAGNOSIS — N182 Chronic kidney disease, stage 2 (mild): Secondary | ICD-10-CM | POA: Diagnosis not present

## 2016-08-16 ENCOUNTER — Telehealth: Payer: Self-pay | Admitting: Family Medicine

## 2016-08-16 DIAGNOSIS — K219 Gastro-esophageal reflux disease without esophagitis: Secondary | ICD-10-CM | POA: Diagnosis not present

## 2016-08-16 DIAGNOSIS — F0281 Dementia in other diseases classified elsewhere with behavioral disturbance: Secondary | ICD-10-CM | POA: Diagnosis not present

## 2016-08-16 DIAGNOSIS — I2510 Atherosclerotic heart disease of native coronary artery without angina pectoris: Secondary | ICD-10-CM | POA: Diagnosis not present

## 2016-08-16 DIAGNOSIS — N182 Chronic kidney disease, stage 2 (mild): Secondary | ICD-10-CM | POA: Diagnosis not present

## 2016-08-16 DIAGNOSIS — G301 Alzheimer's disease with late onset: Secondary | ICD-10-CM | POA: Diagnosis not present

## 2016-08-16 DIAGNOSIS — I129 Hypertensive chronic kidney disease with stage 1 through stage 4 chronic kidney disease, or unspecified chronic kidney disease: Secondary | ICD-10-CM | POA: Diagnosis not present

## 2016-08-16 NOTE — Telephone Encounter (Signed)
Patient's POA and care giver [facility] ) calling in ref to patient not having a  Bowel movement.  Pt has gone 3 days or more without have one.  She has given him Senakot S (starting w/2 last night and 1 this morning).  Is there something else he can try? Please advise   cb  [phone]

## 2016-08-17 NOTE — Telephone Encounter (Signed)
Try miralax.  Push water.  May use dulcolax suppositories

## 2016-08-17 NOTE — Telephone Encounter (Signed)
Lula called left message for the nurse to return her call about Mr Buss. Please advise [phone]

## 2016-08-17 NOTE — Telephone Encounter (Signed)
Pt aware of mds advice.

## 2016-08-18 DIAGNOSIS — K219 Gastro-esophageal reflux disease without esophagitis: Secondary | ICD-10-CM | POA: Diagnosis not present

## 2016-08-18 DIAGNOSIS — G301 Alzheimer's disease with late onset: Secondary | ICD-10-CM | POA: Diagnosis not present

## 2016-08-18 DIAGNOSIS — F0281 Dementia in other diseases classified elsewhere with behavioral disturbance: Secondary | ICD-10-CM | POA: Diagnosis not present

## 2016-08-18 DIAGNOSIS — I129 Hypertensive chronic kidney disease with stage 1 through stage 4 chronic kidney disease, or unspecified chronic kidney disease: Secondary | ICD-10-CM | POA: Diagnosis not present

## 2016-08-18 DIAGNOSIS — I2510 Atherosclerotic heart disease of native coronary artery without angina pectoris: Secondary | ICD-10-CM | POA: Diagnosis not present

## 2016-08-18 DIAGNOSIS — N182 Chronic kidney disease, stage 2 (mild): Secondary | ICD-10-CM | POA: Diagnosis not present

## 2016-08-20 DIAGNOSIS — I2510 Atherosclerotic heart disease of native coronary artery without angina pectoris: Secondary | ICD-10-CM | POA: Diagnosis not present

## 2016-08-20 DIAGNOSIS — F0281 Dementia in other diseases classified elsewhere with behavioral disturbance: Secondary | ICD-10-CM | POA: Diagnosis not present

## 2016-08-20 DIAGNOSIS — G301 Alzheimer's disease with late onset: Secondary | ICD-10-CM | POA: Diagnosis not present

## 2016-08-20 DIAGNOSIS — K219 Gastro-esophageal reflux disease without esophagitis: Secondary | ICD-10-CM | POA: Diagnosis not present

## 2016-08-20 DIAGNOSIS — N182 Chronic kidney disease, stage 2 (mild): Secondary | ICD-10-CM | POA: Diagnosis not present

## 2016-08-20 DIAGNOSIS — I129 Hypertensive chronic kidney disease with stage 1 through stage 4 chronic kidney disease, or unspecified chronic kidney disease: Secondary | ICD-10-CM | POA: Diagnosis not present

## 2016-08-23 DIAGNOSIS — I129 Hypertensive chronic kidney disease with stage 1 through stage 4 chronic kidney disease, or unspecified chronic kidney disease: Secondary | ICD-10-CM | POA: Diagnosis not present

## 2016-08-23 DIAGNOSIS — G301 Alzheimer's disease with late onset: Secondary | ICD-10-CM | POA: Diagnosis not present

## 2016-08-23 DIAGNOSIS — N182 Chronic kidney disease, stage 2 (mild): Secondary | ICD-10-CM | POA: Diagnosis not present

## 2016-08-23 DIAGNOSIS — I2510 Atherosclerotic heart disease of native coronary artery without angina pectoris: Secondary | ICD-10-CM | POA: Diagnosis not present

## 2016-08-23 DIAGNOSIS — F0281 Dementia in other diseases classified elsewhere with behavioral disturbance: Secondary | ICD-10-CM | POA: Diagnosis not present

## 2016-08-23 DIAGNOSIS — K219 Gastro-esophageal reflux disease without esophagitis: Secondary | ICD-10-CM | POA: Diagnosis not present

## 2016-08-24 ENCOUNTER — Encounter: Payer: Self-pay | Admitting: Family Medicine

## 2016-08-24 ENCOUNTER — Ambulatory Visit (INDEPENDENT_AMBULATORY_CARE_PROVIDER_SITE_OTHER): Payer: Medicare Other | Admitting: Family Medicine

## 2016-08-24 VITALS — BP 120/74 | HR 68 | Temp 98.1°F | Resp 16 | Ht 68.0 in | Wt 135.0 lb

## 2016-08-24 DIAGNOSIS — K5900 Constipation, unspecified: Secondary | ICD-10-CM

## 2016-08-24 DIAGNOSIS — F0391 Unspecified dementia with behavioral disturbance: Secondary | ICD-10-CM

## 2016-08-24 MED ORDER — TRIAMCINOLONE 0.1 % CREAM:EUCERIN CREAM 1:1: 1.0000 "application " | TOPICAL_CREAM | Freq: Two times a day (BID) | CUTANEOUS | 1 refills | Status: DC | PRN

## 2016-08-24 MED ORDER — POLYETHYLENE GLYCOL 3350 17 G PO PACK: 17.0000 g | PACK | Freq: Two times a day (BID) | ORAL | 6 refills | Status: DC

## 2016-08-24 NOTE — Patient Instructions (Signed)
Increase miralax to 2 x a day  See me every 3 months

## 2016-08-24 NOTE — Progress Notes (Signed)
Chief Complaint  Patient presents with  . Constipation   Patient is here with his caregiver, Vaughan Basta, and her daughter They tell me that he appears to be stable at home. His eating is declining. We discussed ways to increase his calories. Activities the same. He naps a lot. He does go to a senior center and cooperates with staff. He is largely cooperative at home. He sleeps at night. No wandering or behaviors. Recently had a bout of constipation. He is using MiraLAX once a day. We discussed increasing the MiraLAX to twice a day. Discussed print use prunes apple juice and foods that might help. He doesn't drink water. They should encourage him with a drink of his choice with no caffeine, flavored water, seltzer water, even ginger ale. No falls. No recent illness He is compliant with medication.   Patient Active Problem List   Diagnosis Date Noted  . CKD (chronic kidney disease) stage 2, GFR 60-89 ml/min 06/18/2016  . Gait instability 06/14/2016  . Aortic valve calcification 04/07/2016  . History of myocardial infarction 04/07/2016  . GERD (gastroesophageal reflux disease) 10/04/2014  . Dementia 04/16/2013  . Hyperlipidemia 04/16/2013  . Esophageal dysphagia 06/23/2012  . Essential hypertension 04/08/2012  . Hypothyroidism 10/15/2009  . Psoriasis 10/15/2009  . PAD (peripheral artery disease) ([place]) 09/11/2009  . Benign esophageal stricture 09/11/2009  . DIVERTICULAR DISEASE 09/11/2009    Outpatient Encounter Prescriptions as of 08/24/2016  Medication Sig  . acetaminophen (TYLENOL) 325 MG tablet Take 2 tablets (650 mg total) by mouth every 6 (six) hours as needed for moderate pain.  Marland Kitchen aspirin 81 MG tablet Take 81 mg by mouth daily.    Marland Kitchen atorvastatin (LIPITOR) 20 MG tablet TAKE ONE-HALF TABLET BY MOUTH ONCE DAILY  . levothyroxine (SYNTHROID, LEVOTHROID) 112 MCG tablet TAKE ONE TABLET BY MOUTH ONCE DAILY BEFORE BREAKFAST  . nitroGLYCERIN (NITROSTAT) 0.4 MG SL tablet Place 1 tablet  (0.4 mg total) under the tongue every 5 (five) minutes as needed for chest pain.  . pantoprazole (PROTONIX) 20 MG tablet Take 1 tablet (20 mg total) by mouth daily.  . polyethylene glycol (MIRALAX / GLYCOLAX) packet Take 17 g by mouth daily.  . risperiDONE (RISPERDAL) 0.5 MG tablet Take 1 tablet (0.5 mg total) by mouth 2 (two) times daily.  . Triamcinolone Acetonide (TRIAMCINOLONE 0.1 % CREAM : EUCERIN) CREA Apply 1 application topically 2 (two) times daily as needed.   No facility-administered encounter medications on file as of 08/24/2016.     No Known Allergies  Review of Systems  Unable to perform ROS: Dementia   See history of present illness   BP 120/74 (BP Location: Right Arm, Patient Position: Sitting, Cuff Size: Large)   Pulse 68   Temp 98.1 F (36.7 C) (Temporal)   Resp 16   Ht 5\' 8"  (1.727 m)   Wt 135 lb 0.6 oz (61.3 kg)   SpO2 99%   BMI 20.53 kg/m   Physical Exam  Constitutional: He appears well-developed. No distress.  Thin, balance poor, small steps.  NAD  HENT:  Head: Normocephalic.  Mouth/Throat: Oropharynx is clear and moist.  Dentures  Eyes: Pupils are equal, round, and reactive to light. Conjunctivae are normal.  Neck: Normal range of motion. No thyromegaly present.  Cardiovascular: Normal rate, regular rhythm and normal heart sounds.   occas ectopy  Pulmonary/Chest: Effort normal and breath sounds normal. He has no rales.  Abdominal: Soft. Bowel sounds are normal. There is no tenderness.  Musculoskeletal: Normal  range of motion. He exhibits no edema.  No focal weakness, general atrophy  Lymphadenopathy:    He has no cervical adenopathy.  Neurological: He is alert. He displays normal reflexes. Coordination abnormal.  Skin: Rash noted.  Psoriasis, mild, noted on elbows  Psychiatric: His behavior is normal. His speech is delayed. He exhibits abnormal recent memory and abnormal remote memory.  Quiet.  Answers simple questions correctly, memory impaired.   He is inattentive.    ASSESSMENT/PLAN:  1. Dementia with behavioral disturbance  2. Constipation  Discussion with family about anticipatory guidance. Diet and exercise. Behavior controls. Medications. Respite care  Patient Instructions  Increase miralax to 2 x a day  See me every 3 months   Raylene Everts, MD

## 2016-08-25 ENCOUNTER — Other Ambulatory Visit: Payer: Self-pay

## 2016-08-25 DIAGNOSIS — K219 Gastro-esophageal reflux disease without esophagitis: Secondary | ICD-10-CM | POA: Diagnosis not present

## 2016-08-25 DIAGNOSIS — F0281 Dementia in other diseases classified elsewhere with behavioral disturbance: Secondary | ICD-10-CM | POA: Diagnosis not present

## 2016-08-25 DIAGNOSIS — I129 Hypertensive chronic kidney disease with stage 1 through stage 4 chronic kidney disease, or unspecified chronic kidney disease: Secondary | ICD-10-CM | POA: Diagnosis not present

## 2016-08-25 DIAGNOSIS — G301 Alzheimer's disease with late onset: Secondary | ICD-10-CM | POA: Diagnosis not present

## 2016-08-25 DIAGNOSIS — I2510 Atherosclerotic heart disease of native coronary artery without angina pectoris: Secondary | ICD-10-CM | POA: Diagnosis not present

## 2016-08-25 DIAGNOSIS — N182 Chronic kidney disease, stage 2 (mild): Secondary | ICD-10-CM | POA: Diagnosis not present

## 2016-08-25 MED ORDER — TRIAMCINOLONE 0.1 % CREAM:EUCERIN CREAM 1:1: 1.0000 "application " | TOPICAL_CREAM | Freq: Two times a day (BID) | CUTANEOUS | 1 refills | Status: DC | PRN

## 2016-08-29 ENCOUNTER — Encounter (HOSPITAL_COMMUNITY): Payer: Self-pay | Admitting: Family Medicine

## 2016-08-29 ENCOUNTER — Ambulatory Visit (HOSPITAL_COMMUNITY)
Admission: EM | Admit: 2016-08-29 | Discharge: 2016-08-29 | Disposition: A | Discharge status: Home / Self Care | Payer: Medicare Other | Attending: Family Medicine | Admitting: Family Medicine

## 2016-08-29 ENCOUNTER — Ambulatory Visit (INDEPENDENT_AMBULATORY_CARE_PROVIDER_SITE_OTHER): Payer: Medicare Other

## 2016-08-29 DIAGNOSIS — S52612A Displaced fracture of left ulna styloid process, initial encounter for closed fracture: Secondary | ICD-10-CM

## 2016-08-29 DIAGNOSIS — S52532A Colles' fracture of left radius, initial encounter for closed fracture: Secondary | ICD-10-CM

## 2016-08-29 NOTE — ED Provider Notes (Signed)
Raymond [name]    CSN: [id number] Arrival date & time: 08/29/16  [place]     History   Chief Complaint Chief Complaint  Patient presents with  . Fall  . Arm Pain    HPI Raymond [name] is a 83 y.o. male.   83 year old male accompanied by his daughter and wife after having a fall this afternoon around 4 PM. The area patient is alert appearing and has some dementia part of the history comes from the patient but most of it from the family. He was trying to climb a small grade when he slipped backwards and fell onto his left elbow and left outstretched arm. He is complaining of pain to the left wrist. He denies injury elsewhere.      Past Medical History:  Diagnosis Date  . Anemia   . Anxiety   . ASCVD (arteriosclerotic cardiovascular disease)    2 MI's in 1990s  . Cataract   . Chronic renal insufficiency, stage II (mild)    CrCl in the 60s  . Dementia   . Depression   . DJD (degenerative joint disease)   . Esophageal dysphagia    Secondary to Schatzki's ring with dilation x2, most recently in 06/09, negative for H. pylori  . Gallstones   . GERD (gastroesophageal reflux disease)   . HTN (hypertension)    BP meds stopped 2016 due to recurrent orthostatic hypotension  . Hyperlipidemia   . Hypothyroidism   . Mild cognitive impairment with memory loss   . Myocardial infarction [facility]) [id number]   Cardiac cath done but no other intervention that we know of  . Orthostatic hypotension   . Psoriasis   . Thrombocytopenia ([place])    Platelets 99K on 09/2009 labs  . Ulcerative proctitis ([place])    Dx: 2000, started Asacol at that time.  Was taken off asacol 2014 and has done fine since (Dr. Sydell Axon, [facility])    Patient Active Problem List   Diagnosis Date Noted  . CKD (chronic kidney disease) stage 2, GFR 60-89 ml/min 06/18/2016  . Gait instability 06/14/2016  . Aortic valve calcification 04/07/2016  . History of myocardial infarction 04/07/2016  . GERD  (gastroesophageal reflux disease) 10/04/2014  . Dementia 04/16/2013  . Hyperlipidemia 04/16/2013  . Esophageal dysphagia 06/23/2012  . Essential hypertension 04/08/2012  . Hypothyroidism 10/15/2009  . Psoriasis 10/15/2009  . PAD (peripheral artery disease) ([place]) 09/11/2009  . Benign esophageal stricture 09/11/2009  . DIVERTICULAR DISEASE 09/11/2009    Past Surgical History:  Procedure Laterality Date  . APPENDECTOMY  1940  . CLEFT LIP REPAIR     As a child  . COLECTOMY  2004   Colonovesicular fistula secondary to diverticulitis requiring sigmoid colectomy and bladder repair  . COLONOSCOPY  06/2007   Dr. Olegario Messier: Few diverticula, normal anastomosiss/p sigmoid colectomy for diverticulitis  . colovesicular fistula  2004  . ERCP N/A 03/07/2016   Procedure: ENDOSCOPIC RETROGRADE CHOLANGIOPANCREATOGRAPHY (ERCP);  Surgeon: Doran Stabler, MD;  Location: [facility] ENDOSCOPY;  Service: Endoscopy;  Laterality: N/A;  . ERCP N/A 04/21/2016   Procedure: ENDOSCOPIC RETROGRADE CHOLANGIOPANCREATOGRAPHY (ERCP);  Surgeon: Doran Stabler, MD;  Location: [place];  Service: Gastroenterology;  Laterality: N/A;  . ESOPHAGEAL DILATION  06-23-07; 06/28/12   H pylori NEG.  2014-reactive gastropathy with surface erosion.  . ESOPHAGOGASTRODUODENOSCOPY  06/2007   Dr. Olegario Messier: symptomatic Schatzki ring, s/p 26mm Savary dilator, Erosive duodenitis, s/p bx   . ESOPHAGOGASTRODUODENOSCOPY N/A 04/21/2016  Procedure: ESOPHAGOGASTRODUODENOSCOPY (EGD);  Surgeon: Doran Stabler, MD;  Location: [place];  Service: Gastroenterology;  Laterality: N/A;  . ESOPHAGOGASTRODUODENOSCOPY (EGD) WITH ESOPHAGEAL DILATION N/A 06/28/2012   MGQ:[id number] cervical web and distal esophageal s/p dilation/Hiatal hernia. Antral erosions (reactive gastropathy, no H.pylori)  . HERNIA REPAIR    . NECK MASS EXCISION  1979       Home Medications    Prior to Admission medications   Medication Sig Start Date End Date Taking?  Authorizing Provider  acetaminophen (TYLENOL) 325 MG tablet Take 2 tablets (650 mg total) by mouth every 6 (six) hours as needed for moderate pain. 03/09/16   Doreatha Lew, MD  aspirin 81 MG tablet Take 81 mg by mouth daily.      [provider]  atorvastatin (LIPITOR) 20 MG tablet TAKE ONE-HALF TABLET BY MOUTH ONCE DAILY 07/18/14   McGowen, Adrian Blackwater, MD  levothyroxine (SYNTHROID, LEVOTHROID) 112 MCG tablet TAKE ONE TABLET BY MOUTH ONCE DAILY BEFORE BREAKFAST 11/22/14   Kuneff, Renee A, DO  nitroGLYCERIN (NITROSTAT) 0.4 MG SL tablet Place 1 tablet (0.4 mg total) under the tongue every 5 (five) minutes as needed for chest pain. 06/26/14   McGowen, Adrian Blackwater, MD  pantoprazole (PROTONIX) 20 MG tablet Take 1 tablet (20 mg total) by mouth daily. 07/20/16   Raylene Everts, MD  polyethylene glycol [facility] Floria Raveling) packet Take 17 g by mouth 2 (two) times daily. 08/24/16   Raylene Everts, MD  risperiDONE (RISPERDAL) 0.5 MG tablet Take 1 tablet (0.5 mg total) by mouth 2 (two) times daily. 03/09/16   Doreatha Lew, MD  Triamcinolone Acetonide (TRIAMCINOLONE 0.1 % CREAM : EUCERIN) CREA Apply 1 application topically 2 (two) times daily as needed. 08/25/16   Raylene Everts, MD    Family History Family History  Problem Relation Age of Onset  . Pneumonia Mother   . Depression Mother   . Cancer Father        pt doesn't remember type  . Heart disease Daughter        heart defect: d age 25    Social History Social History  Substance Use Topics  . Smoking status: Former Smoker    Packs/day: 1.00    Years: 10.00    Types: Cigarettes    Quit date: 01/11/1970  . Smokeless tobacco: Never Used     Comment: Minimal use at 20  . Alcohol use No     Allergies   Patient has no known allergies.   Review of Systems Review of Systems  Constitutional: Negative.   HENT: Negative.   Eyes: Negative.   Respiratory: Negative.   Cardiovascular: Negative for chest pain.    Gastrointestinal: Negative.   Musculoskeletal: Positive for arthralgias.  Skin: Positive for wound.       Abrasions to the left elbow.  Neurological: Negative for syncope, facial asymmetry and headaches.  All other systems reviewed and are negative.    Physical Exam Triage Vital Signs ED Triage Vitals [08/29/16 1808]  Enc Vitals Group     BP 127/79     Pulse Rate 80     Resp 18     Temp      Temp src      SpO2 100 %     Weight      Height      Head Circumference      Peak Flow      Pain Score      Pain  Loc      Pain Edu?      Excl. in [place]?    No data found.   Updated Vital Signs BP 127/79   Pulse 80   Resp 18   SpO2 100%   Visual Acuity Right Eye Distance:   Left Eye Distance:   Bilateral Distance:    Right Eye Near:   Left Eye Near:    Bilateral Near:     Physical Exam  Constitutional: He is oriented to person, place, and time. He appears well-developed and well-nourished.  HENT:  Head: Normocephalic and atraumatic.  Nose: Nose normal.  Mouth/Throat: Oropharynx is clear and moist. No oropharyngeal exudate.  Eyes: Pupils are equal, round, and reactive to light. EOM are normal. Left eye exhibits no discharge.  Neck: Normal range of motion. Neck supple.  No neck or spinal tenderness, deformity, discoloration. Demonstrates full range range of motion of the neck.  Pulmonary/Chest: Effort normal.  Abdominal: Soft. There is no tenderness.  Musculoskeletal: He exhibits no deformity.  Left elbow with full range of motion briskly flexion and extension, pronation and supination fully intact. There is swelling and tenderness to the left wrist. No deformity or discoloration. Radial pulse 2+. Distal neurovascular and motor sensory are grossly intact.  Lymphadenopathy:    He has no cervical adenopathy.  Neurological: He is alert and oriented to person, place, and time. No cranial nerve deficit.  Skin: Skin is warm and dry.  Psychiatric: He has a normal mood and  affect.  Nursing note and vitals reviewed.    UC Treatments / Results  Labs (all labs ordered are listed, but only abnormal results are displayed) Labs Reviewed - No data to display  EKG  EKG Interpretation None       Radiology Dg Wrist Complete Left  Result Date: 08/29/2016 CLINICAL DATA:  Wrist pain after fall. EXAM: LEFT WRIST - COMPLETE 3+ VIEW COMPARISON:  09/21/2015 new FINDINGS: The bones are demineralized in appearance. There is soft tissue swelling about the left wrist with new, closed, nondisplaced fracture of the ulnar styloid and minimal dorsally displace fracture involving the distal radial metaphysis. The fracture of the radius appears to extend into the distal radioulnar joint. There is osteoarthritis at the base of the thumb metacarpal and triscaphe joint of the wrist in addition to joint space narrowing of the radiocarpal joint. Osteoarthritic spurring is noted of the lunate across the scapholunate interval. IMPRESSION: 1. Acute, closed nondisplaced ulnar styloid fracture. 2. Acute, closed fracture of the distal radial metaphysis with slight dorsal displacement of the distal fracture fragment and with intra-articular extension into the distal radioulnar joint. 3. Osteoarthritis of the first CMC, triscaphe and radiocarpal joints. Electronically Signed   By: Ashley Royalty M.D.   On: 08/29/2016 18:55    Procedures Procedures (including critical care time)  Medications Ordered in UC Medications - No data to display   Initial Impression / Assessment and Plan / UC Course  I have reviewed the triage vital signs and the nursing notes.  Pertinent labs & imaging results that were available during my care of the patient were reviewed by me and considered in my medical decision making (see chart for details).     Try to keep the splint dry. Try to help him keep the arm elevated. He may take Tylenol for pain. The hand surgeon office should call you within the next 2-3 days.  They will help make an appointment for follow-up with the hand surgeon. For worsening, new  symptoms or problems may return or call the office of the hand surgeon.   Final Clinical Impressions(s) / UC Diagnoses   Final diagnoses:  Closed Colles' fracture of left radius, initial encounter  Closed displaced fracture of styloid process of left ulna, initial encounter    New Prescriptions New Prescriptions   No medications on file     Controlled Substance Prescriptions [place] Controlled Substance Registry consulted? Not Applicable   Janne Napoleon, NP 08/29/16 1924

## 2016-08-29 NOTE — Discharge Instructions (Signed)
Try to keep the splint dry. Try to help him keep the arm elevated. He may take Tylenol for pain. The hand surgeon office should call you within the next 2-3 days. They will help make an appointment for follow-up with the hand surgeon. For worsening, new symptoms or problems may return or call the office of the hand surgeon.

## 2016-08-29 NOTE — Progress Notes (Signed)
Orthopedic Tech Progress Note Patient Details:  Raymond [name] [birth year] [id number]  Ortho Devices Type of Ortho Device: Ace wrap, Arm sling, Sugartong splint Ortho Device/Splint Location: LUE Ortho Device/Splint Interventions: Ordered, Application   Braulio Bosch 08/29/2016, 7:35 PM

## 2016-08-29 NOTE — ED Notes (Signed)
Notified ortho tech of splint needed.

## 2016-08-29 NOTE — ED Triage Notes (Signed)
Pt had a fall today and injured left wrist and left elbow. Unsure of how the fall happened. No other injuries. Denies hitting head or LOC. Denies dizziness or fainting.

## 2016-08-30 ENCOUNTER — Ambulatory Visit (INDEPENDENT_AMBULATORY_CARE_PROVIDER_SITE_OTHER): Payer: Medicare Other | Admitting: Orthopaedic Surgery

## 2016-08-30 ENCOUNTER — Encounter (INDEPENDENT_AMBULATORY_CARE_PROVIDER_SITE_OTHER): Payer: Self-pay | Admitting: Orthopaedic Surgery

## 2016-08-30 VITALS — BP 116/78 | HR 104 | Ht 68.0 in | Wt 135.0 lb

## 2016-08-30 DIAGNOSIS — S52532A Colles' fracture of left radius, initial encounter for closed fracture: Secondary | ICD-10-CM | POA: Diagnosis not present

## 2016-08-30 NOTE — Progress Notes (Signed)
Office Visit Note   Patient: Raymond [name]           Date of Birth: 1927-01-05           MRN: [id number] Visit Date: 08/30/2016              Requested by: Raymond Everts, MD [phone] [place], [place] [address] PCP: Raymond Everts, MD   Assessment & Plan: Visit Diagnoses:  1. Colles' fracture of left radius, initial encounter for closed fracture     Plan: Distal radius and ulnar styloid fracture in satisfactory position. We'll place and long-arm fiberglass cast since he continues to remove the splint. Office follow-up 4 weeks for cast off x-rays out of plaster and then black wrist splint application  Follow-Up Instructions: No Follow-up on file.   Orders:  No orders of the defined types were placed in this encounter.  No orders of the defined types were placed in this encounter.     Procedures: No procedures performed   Clinical Data: No additional findings.   Subjective: Chief Complaint  Patient presents with  . Left Wrist - Fracture    HPI 83 year old male with cognitive problems here with his wife fell yesterday had a splint applied at [facility] count he took it off at night he will even wear the sling. Wife somehow reapplied it and he denies any pain. The history supplied by his wife as well as chart review.  Review of Systems positive for diverticulitis F Jill stricture dementia hypertension hyperlipidemia GERD. Valve calcification or [place]eo disease history of MI, stage II kidney disease. 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   Objective: Vital Signs: BP 116/78   Pulse (!) 104   Ht 5\' 8"  (1.727 m)   Wt 135 lb (61.2 kg)   BMI 20.53 kg/m   Physical Exam  Constitutional: He is oriented to person, place, and time. He appears well-developed and well-nourished.  HENT:  Head: Normocephalic and atraumatic.  Eyes: Pupils are equal, round, and reactive to light. EOM are normal.  Neck: No tracheal  deviation present. No thyromegaly present.  Cardiovascular: Normal rate.   Pulmonary/Chest: Effort normal. He has no wheezes.  Abdominal: Soft. Bowel sounds are normal.  Neurological: He is alert and oriented to person, place, and time.  Skin: Skin is warm and dry. Capillary refill takes less than 2 seconds.  Psychiatric:  Patient with poor cognitive function he is smiling very pleasant.    Ortho Exam minimal swelling of the hand splint as currently on as wife says associated lesion Marc Morgans takes it off.  Specialty Comments:  No specialty comments available.  Imaging: Dg Wrist Complete Left  Result Date: 08/29/2016 CLINICAL DATA:  Wrist pain after fall. EXAM: LEFT WRIST - COMPLETE 3+ VIEW COMPARISON:  09/21/2015 new FINDINGS: The bones are demineralized in appearance. There is soft tissue swelling about the left wrist with new, closed, nondisplaced fracture of the ulnar styloid and minimal dorsally displace fracture involving the distal radial metaphysis. The fracture of the radius appears to extend into the distal radioulnar joint. There is osteoarthritis at the base of the thumb metacarpal and triscaphe joint of the wrist in addition to joint space narrowing of the radiocarpal joint. Osteoarthritic spurring is noted of the lunate across the scapholunate interval. IMPRESSION: 1. Acute, closed nondisplaced ulnar styloid fracture. 2. Acute, closed fracture of the distal radial metaphysis with slight dorsal displacement of the distal fracture  fragment and with intra-articular extension into the distal radioulnar joint. 3. Osteoarthritis of the first CMC, triscaphe and radiocarpal joints. Electronically Signed   By: Raymond [name] M.D.   On: 08/29/2016 18:55     PMFS History: Patient Active Problem List   Diagnosis Date Noted  . CKD (chronic kidney disease) stage 2, GFR 60-89 ml/min 06/18/2016  . Gait instability 06/14/2016  . Aortic valve calcification 04/07/2016  . History of myocardial  infarction 04/07/2016  . GERD (gastroesophageal reflux disease) 10/04/2014  . Dementia 04/16/2013  . Hyperlipidemia 04/16/2013  . Esophageal dysphagia 06/23/2012  . Essential hypertension 04/08/2012  . Hypothyroidism 10/15/2009  . Psoriasis 10/15/2009  . PAD (peripheral artery disease) ([place]) 09/11/2009  . Benign esophageal stricture 09/11/2009  . DIVERTICULAR DISEASE 09/11/2009   Past Medical History:  Diagnosis Date  . Anemia   . Anxiety   . ASCVD (arteriosclerotic cardiovascular disease)    2 MI's in 1990s  . Cataract   . Chronic renal insufficiency, stage II (mild)    CrCl in the 60s  . Dementia   . Depression   . DJD (degenerative joint disease)   . Esophageal dysphagia    Secondary to Schatzki's ring with dilation x2, most recently in 06/09, negative for H. pylori  . Gallstones   . GERD (gastroesophageal reflux disease)   . HTN (hypertension)    BP meds stopped 2016 due to recurrent orthostatic hypotension  . Hyperlipidemia   . Hypothyroidism   . Mild cognitive impairment with memory loss   . Myocardial infarction [facility]) [id number]   Cardiac cath done but no other intervention that we know of  . Orthostatic hypotension   . Psoriasis   . Thrombocytopenia ([place])    Platelets 99K on 09/2009 labs  . Ulcerative proctitis ([place])    Dx: 2000, started Asacol at that time.  Was taken off asacol 2014 and has done fine since (Dr. Sydell Axon, [facility])    Family History  Problem Relation Age of Onset  . Pneumonia Mother   . Depression Mother   . Cancer Father        pt doesn't remember type  . Heart disease Daughter        heart defect: d age 30    Past Surgical History:  Procedure Laterality Date  . APPENDECTOMY  1940  . CLEFT LIP REPAIR     As a child  . COLECTOMY  2004   Colonovesicular fistula secondary to diverticulitis requiring sigmoid colectomy and bladder repair  . COLONOSCOPY  06/2007   Dr. Olegario Messier: Few diverticula, normal anastomosiss/p sigmoid  colectomy for diverticulitis  . colovesicular fistula  2004  . ERCP N/A 03/07/2016   Procedure: ENDOSCOPIC RETROGRADE CHOLANGIOPANCREATOGRAPHY (ERCP);  Surgeon: Doran Stabler, MD;  Location[facility] ENDOSCOPY;  Service: Endoscopy;  Laterality: N/A;  . ERCP N/A 04/21/2016   Procedure: ENDOSCOPIC RETROGRADE CHOLANGIOPANCREATOGRAPHY (ERCP);  Surgeon: Doran Stabler, MD;  Location: [place];  Service: Gastroenterology;  Laterality: N/A;  . ESOPHAGEAL DILATION  06-23-07; 06/28/12   H pylori NEG.  2014-reactive gastropathy with surface erosion.  . ESOPHAGOGASTRODUODENOSCOPY  06/2007   Dr. Olegario Messier: symptomatic Schatzki ring, s/p 42mm Savary dilator, Erosive duodenitis, s/p bx   . ESOPHAGOGASTRODUODENOSCOPY N/A 04/21/2016   Procedure: ESOPHAGOGASTRODUODENOSCOPY (EGD);  Surgeon: Doran Stabler, MD;  Location: [place];  Service: Gastroenterology;  Laterality: N/A;  . ESOPHAGOGASTRODUODENOSCOPY (EGD) WITH ESOPHAGEAL DILATION N/A 06/28/2012   [id number] cervical web and distal esophageal s/p dilation/Hiatal hernia.  Antral erosions (reactive gastropathy, no H.pylori)  . HERNIA REPAIR    . NECK MASS EXCISION  1979   Social History   Occupational History  . Retired from [employer]  . Smoking status: Former Smoker    Packs/day: 1.00    Years: 10.00    Types: Cigarettes    Quit date: 01/11/1970  . Smokeless tobacco: Never Used     Comment: Minimal use at 20  . Alcohol use No  . Drug use: No  . Sexual activity: Not Currently

## 2016-09-01 ENCOUNTER — Telehealth: Payer: Self-pay

## 2016-09-01 DIAGNOSIS — N182 Chronic kidney disease, stage 2 (mild): Secondary | ICD-10-CM | POA: Diagnosis not present

## 2016-09-01 DIAGNOSIS — G301 Alzheimer's disease with late onset: Secondary | ICD-10-CM | POA: Diagnosis not present

## 2016-09-01 DIAGNOSIS — I129 Hypertensive chronic kidney disease with stage 1 through stage 4 chronic kidney disease, or unspecified chronic kidney disease: Secondary | ICD-10-CM | POA: Diagnosis not present

## 2016-09-01 DIAGNOSIS — F0281 Dementia in other diseases classified elsewhere with behavioral disturbance: Secondary | ICD-10-CM | POA: Diagnosis not present

## 2016-09-01 DIAGNOSIS — I2510 Atherosclerotic heart disease of native coronary artery without angina pectoris: Secondary | ICD-10-CM | POA: Diagnosis not present

## 2016-09-01 DIAGNOSIS — K219 Gastro-esophageal reflux disease without esophagitis: Secondary | ICD-10-CM | POA: Diagnosis not present

## 2016-09-01 NOTE — Telephone Encounter (Signed)
Danny from [facility] left message that Raymond fell [name] out of a car Sunday and has a fx left arm.Marland Kitchen

## 2016-09-03 DIAGNOSIS — F0281 Dementia in other diseases classified elsewhere with behavioral disturbance: Secondary | ICD-10-CM | POA: Diagnosis not present

## 2016-09-03 DIAGNOSIS — G301 Alzheimer's disease with late onset: Secondary | ICD-10-CM | POA: Diagnosis not present

## 2016-09-03 DIAGNOSIS — I129 Hypertensive chronic kidney disease with stage 1 through stage 4 chronic kidney disease, or unspecified chronic kidney disease: Secondary | ICD-10-CM | POA: Diagnosis not present

## 2016-09-03 DIAGNOSIS — K219 Gastro-esophageal reflux disease without esophagitis: Secondary | ICD-10-CM | POA: Diagnosis not present

## 2016-09-03 DIAGNOSIS — I2510 Atherosclerotic heart disease of native coronary artery without angina pectoris: Secondary | ICD-10-CM | POA: Diagnosis not present

## 2016-09-03 DIAGNOSIS — N182 Chronic kidney disease, stage 2 (mild): Secondary | ICD-10-CM | POA: Diagnosis not present

## 2016-09-07 ENCOUNTER — Encounter: Payer: Self-pay | Admitting: Family Medicine

## 2016-09-07 ENCOUNTER — Ambulatory Visit (INDEPENDENT_AMBULATORY_CARE_PROVIDER_SITE_OTHER): Payer: Medicare Other | Admitting: Family Medicine

## 2016-09-07 VITALS — BP 126/74 | HR 86 | Temp 97.7°F | Resp 16 | Ht 68.0 in | Wt 136.0 lb

## 2016-09-07 DIAGNOSIS — R296 Repeated falls: Secondary | ICD-10-CM | POA: Diagnosis not present

## 2016-09-07 NOTE — Progress Notes (Signed)
Chief Complaint  Patient presents with  . Fall    today at [facility] center   Multiple falls He has attended PT He does exercise at home and at the senior center Is very memory impaired and forgets to slow down, ask for help, step over objects, has muscle weakness and poor balance. He feel about a week ago and landed on L arm, has a colles fracture in a long arm cast [place] again today towards left and landed on left hip.  His caregiver wants him checked.  He complains of no pain, but said the fractured arm didn't hurt, either. Gait is unsteady , but puts full weight on both legs equally   Patient Active Problem List   Diagnosis Date Noted  . CKD (chronic kidney disease) stage 2, GFR 60-89 ml/min 06/18/2016  . Gait instability 06/14/2016  . Aortic valve calcification 04/07/2016  . History of myocardial infarction 04/07/2016  . GERD (gastroesophageal reflux disease) 10/04/2014  . Dementia 04/16/2013  . Hyperlipidemia 04/16/2013  . Esophageal dysphagia 06/23/2012  . Essential hypertension 04/08/2012  . Hypothyroidism 10/15/2009  . Psoriasis 10/15/2009  . PAD (peripheral artery disease) ([place]) 09/11/2009  . Benign esophageal stricture 09/11/2009  . DIVERTICULAR DISEASE 09/11/2009    Outpatient Encounter Prescriptions as of 09/07/2016  Medication Sig  . acetaminophen (TYLENOL) 325 MG tablet Take 2 tablets (650 mg total) by mouth every 6 (six) hours as needed for moderate pain.  Marland Kitchen aspirin 81 MG tablet Take 81 mg by mouth daily.    Marland Kitchen atorvastatin (LIPITOR) 20 MG tablet TAKE ONE-HALF TABLET BY MOUTH ONCE DAILY  . levothyroxine (SYNTHROID, LEVOTHROID) 112 MCG tablet TAKE ONE TABLET BY MOUTH ONCE DAILY BEFORE BREAKFAST  . nitroGLYCERIN (NITROSTAT) 0.4 MG SL tablet Place 1 tablet (0.4 mg total) under the tongue every 5 (five) minutes as needed for chest pain.  . pantoprazole (PROTONIX) 20 MG tablet Take 1 tablet (20 mg total) by mouth daily.  . polyethylene glycol (MIRALAX / GLYCOLAX)  packet Take 17 g by mouth 2 (two) times daily.  . risperiDONE (RISPERDAL) 0.5 MG tablet Take 1 tablet (0.5 mg total) by mouth 2 (two) times daily.  . Triamcinolone Acetonide (TRIAMCINOLONE 0.1 % CREAM : EUCERIN) CREA Apply 1 application topically 2 (two) times daily as needed.   No facility-administered encounter medications on file as of 09/07/2016.     No Known Allergies  Review of Systems  Constitutional: Negative for activity change, appetite change and unexpected weight change.  Musculoskeletal: Positive for gait problem.  Neurological: Positive for weakness. Negative for dizziness and light-headedness.  Psychiatric/Behavioral: Negative for agitation and behavioral problems.  All other systems reviewed and are negative.   BP 126/74 (BP Location: Right Arm, Patient Position: Sitting, Cuff Size: Normal)   Pulse 86   Temp 97.7 F (36.5 C) (Temporal)   Resp 16   Ht 5\' 8"  (1.727 m)   Wt 136 lb (61.7 kg)   SpO2 99%   BMI 20.68 kg/m   Physical Exam  Constitutional: He appears well-developed. No distress.  Thin, balance poor, small steps.  NAD  HENT:  Head: Normocephalic.  Dentures  Eyes: Pupils are equal, round, and reactive to light. Conjunctivae are normal.  Neck: Normal range of motion.  Cardiovascular: Normal rate, regular rhythm and normal heart sounds.   occas ectopy  Pulmonary/Chest: Effort normal and breath sounds normal.  Musculoskeletal: Normal range of motion. He exhibits no edema.  No focal weakness, general atrophy.  Left hip has good  ROM and no tenderness.  Small faint eccymosis, 1-2 cm Near greater trochanter  Neurological: He is alert. He displays normal reflexes. Coordination abnormal.  Skin:  Psoriasis, mild, noted on elbows  Psychiatric: His behavior is normal. His speech is delayed. He exhibits abnormal recent memory and abnormal remote memory.  Quiet.  Answers simple questions correctly, memory impaired.  He is inattentive.    ASSESSMENT/PLAN:  1.  Fall in elderly patient No harm this time.  May need walker, when arm heals.  He probably would not remember to use unless reminded frequently   Patient Instructions  Return as needed Call for problems   Raylene Everts, MD

## 2016-09-07 NOTE — Patient Instructions (Addendum)
Return as needed Call for problems

## 2016-09-08 DIAGNOSIS — I2510 Atherosclerotic heart disease of native coronary artery without angina pectoris: Secondary | ICD-10-CM | POA: Diagnosis not present

## 2016-09-08 DIAGNOSIS — N182 Chronic kidney disease, stage 2 (mild): Secondary | ICD-10-CM | POA: Diagnosis not present

## 2016-09-08 DIAGNOSIS — I129 Hypertensive chronic kidney disease with stage 1 through stage 4 chronic kidney disease, or unspecified chronic kidney disease: Secondary | ICD-10-CM | POA: Diagnosis not present

## 2016-09-08 DIAGNOSIS — G301 Alzheimer's disease with late onset: Secondary | ICD-10-CM | POA: Diagnosis not present

## 2016-09-08 DIAGNOSIS — F0281 Dementia in other diseases classified elsewhere with behavioral disturbance: Secondary | ICD-10-CM | POA: Diagnosis not present

## 2016-09-08 DIAGNOSIS — K219 Gastro-esophageal reflux disease without esophagitis: Secondary | ICD-10-CM | POA: Diagnosis not present

## 2016-09-10 DIAGNOSIS — N182 Chronic kidney disease, stage 2 (mild): Secondary | ICD-10-CM | POA: Diagnosis not present

## 2016-09-10 DIAGNOSIS — F0281 Dementia in other diseases classified elsewhere with behavioral disturbance: Secondary | ICD-10-CM | POA: Diagnosis not present

## 2016-09-10 DIAGNOSIS — I129 Hypertensive chronic kidney disease with stage 1 through stage 4 chronic kidney disease, or unspecified chronic kidney disease: Secondary | ICD-10-CM | POA: Diagnosis not present

## 2016-09-10 DIAGNOSIS — G301 Alzheimer's disease with late onset: Secondary | ICD-10-CM | POA: Diagnosis not present

## 2016-09-10 DIAGNOSIS — I2510 Atherosclerotic heart disease of native coronary artery without angina pectoris: Secondary | ICD-10-CM | POA: Diagnosis not present

## 2016-09-10 DIAGNOSIS — K219 Gastro-esophageal reflux disease without esophagitis: Secondary | ICD-10-CM | POA: Diagnosis not present

## 2016-09-15 DIAGNOSIS — N182 Chronic kidney disease, stage 2 (mild): Secondary | ICD-10-CM | POA: Diagnosis not present

## 2016-09-15 DIAGNOSIS — F0281 Dementia in other diseases classified elsewhere with behavioral disturbance: Secondary | ICD-10-CM | POA: Diagnosis not present

## 2016-09-15 DIAGNOSIS — I2510 Atherosclerotic heart disease of native coronary artery without angina pectoris: Secondary | ICD-10-CM | POA: Diagnosis not present

## 2016-09-15 DIAGNOSIS — K219 Gastro-esophageal reflux disease without esophagitis: Secondary | ICD-10-CM | POA: Diagnosis not present

## 2016-09-15 DIAGNOSIS — I129 Hypertensive chronic kidney disease with stage 1 through stage 4 chronic kidney disease, or unspecified chronic kidney disease: Secondary | ICD-10-CM | POA: Diagnosis not present

## 2016-09-15 DIAGNOSIS — G301 Alzheimer's disease with late onset: Secondary | ICD-10-CM | POA: Diagnosis not present

## 2016-09-28 ENCOUNTER — Ambulatory Visit (INDEPENDENT_AMBULATORY_CARE_PROVIDER_SITE_OTHER): Payer: Medicare Other | Admitting: Orthopaedic Surgery

## 2016-09-28 ENCOUNTER — Ambulatory Visit (INDEPENDENT_AMBULATORY_CARE_PROVIDER_SITE_OTHER): Payer: Medicare Other

## 2016-09-28 ENCOUNTER — Encounter (INDEPENDENT_AMBULATORY_CARE_PROVIDER_SITE_OTHER): Payer: Self-pay | Admitting: Orthopaedic Surgery

## 2016-09-28 VITALS — BP 122/75 | HR 96

## 2016-09-28 DIAGNOSIS — S52532D Colles' fracture of left radius, subsequent encounter for closed fracture with routine healing: Secondary | ICD-10-CM

## 2016-09-28 NOTE — Progress Notes (Signed)
Office Visit Note   Patient: Raymond [name]           Date of Birth: 09-10-1926           MRN: [id number] Visit Date: 09/28/2016              Requested by: Raylene Everts, MD [phone] [place], [place] [address] PCP: Raylene Everts, MD   Assessment & Plan: Visit Diagnoses:  1. Closed Colles' fracture of left radius with routine healing, subsequent encounter     Plan: We'll place him in a removable Velcro splint and use next 2 weeks he doesn't need to have it on at night when he sleeps he can just have it on during the day when he is ambulating to prevent reinjury for the next 2 weeks and then discontinue the splint.  Follow-Up Instructions: No Follow-up on file.   Orders:  Orders Placed This Encounter  Procedures  . XR Wrist 2 Views Left   No orders of the defined types were placed in this encounter.     Procedures: No procedures performed   Clinical Data: No additional findings.   Subjective: Chief Complaint  Patient presents with  . Left Wrist - Fracture, Follow-up    HPI follow-up distal radius fracture impacted. Cast removed since he continued to remove the previous splints. He has slight tenderness over the ulnar carpal joint.  Review of Systems unchanged from last office visit   Objective: Vital Signs: BP 122/75   Pulse 96   Physical Exam minimal tenderness at the distal radius no angular deformity. He has 50% wrist flexion and extension. Skin is intact.  Ortho Exam  Specialty Comments:  No specialty comments available.  Imaging: No results found.   PMFS History: Patient Active Problem List   Diagnosis Date Noted  . CKD (chronic kidney disease) stage 2, GFR 60-89 ml/min 06/18/2016  . Gait instability 06/14/2016  . Aortic valve calcification 04/07/2016  . History of myocardial infarction 04/07/2016  . GERD (gastroesophageal reflux disease) 10/04/2014  . Dementia 04/16/2013  . Hyperlipidemia 04/16/2013  . Esophageal  dysphagia 06/23/2012  . Essential hypertension 04/08/2012  . Hypothyroidism 10/15/2009  . Psoriasis 10/15/2009  . PAD (peripheral artery disease) ([place]) 09/11/2009  . Benign esophageal stricture 09/11/2009  . DIVERTICULAR DISEASE 09/11/2009   Past Medical History:  Diagnosis Date  . Anemia   . Anxiety   . ASCVD (arteriosclerotic cardiovascular disease)    2 MI's in 1990s  . Cataract   . Chronic renal insufficiency, stage II (mild)    CrCl in the 60s  . Dementia   . Depression   . DJD (degenerative joint disease)   . Esophageal dysphagia    Secondary to Schatzki's ring with dilation x2, most recently in 06/09, negative for H. pylori  . Gallstones   . GERD (gastroesophageal reflux disease)   . HTN (hypertension)    BP meds stopped 2016 due to recurrent orthostatic hypotension  . Hyperlipidemia   . Hypothyroidism   . Mild cognitive impairment with memory loss   . Myocardial infarction [facility]) [id number]   Cardiac cath done but no other intervention that we know of  . Orthostatic hypotension   . Psoriasis   . Thrombocytopenia ([place])    Platelets 99K on 09/2009 labs  . Ulcerative proctitis ([place])    Dx: 2000, started Asacol at that time.  Was taken off asacol 2014 and has done fine since (Dr. Sydell Axon, [facility])  Family History  Problem Relation Age of Onset  . Pneumonia Mother   . Depression Mother   . Cancer Father        pt doesn't remember type  . Heart disease Daughter        heart defect: d age 48    Past Surgical History:  Procedure Laterality Date  . APPENDECTOMY  1940  . CLEFT LIP REPAIR     As a child  . COLECTOMY  2004   Colonovesicular fistula secondary to diverticulitis requiring sigmoid colectomy and bladder repair  . COLONOSCOPY  06/2007   Dr. Olegario Messier: Few diverticula, normal anastomosiss/p sigmoid colectomy for diverticulitis  . colovesicular fistula  2004  . ERCP N/A 03/07/2016   Procedure: ENDOSCOPIC RETROGRADE CHOLANGIOPANCREATOGRAPHY  (ERCP);  Surgeon: Doran Stabler, MD;  Location: [facility] ENDOSCOPY;  Service: Endoscopy;  Laterality: N/A;  . ERCP N/A 04/21/2016   Procedure: ENDOSCOPIC RETROGRADE CHOLANGIOPANCREATOGRAPHY (ERCP);  Surgeon: Doran Stabler, MD;  Location: [place];  Service: Gastroenterology;  Laterality: N/A;  . ESOPHAGEAL DILATION  06-23-07; 06/28/12   H pylori NEG.  2014-reactive gastropathy with surface erosion.  . ESOPHAGOGASTRODUODENOSCOPY  06/2007   Dr. Olegario Messier: symptomatic Schatzki ring, s/p 69mm Savary dilator, Erosive duodenitis, s/p bx   . ESOPHAGOGASTRODUODENOSCOPY N/A 04/21/2016   Procedure: ESOPHAGOGASTRODUODENOSCOPY (EGD);  Surgeon: Doran Stabler, MD;  Location: [place];  Service: Gastroenterology;  Laterality: N/A;  . ESOPHAGOGASTRODUODENOSCOPY (EGD) WITH ESOPHAGEAL DILATION N/A 06/28/2012   [id number] cervical web and distal esophageal s/p dilation/Hiatal hernia. Antral erosions (reactive gastropathy, no H.pylori)  . HERNIA REPAIR    . NECK MASS EXCISION  1979   Social History   Occupational History  . Retired from [employer]  . Smoking status: Former Smoker    Packs/day: 1.00    Years: 10.00    Types: Cigarettes    Quit date: 01/11/1970  . Smokeless tobacco: Never Used     Comment: Minimal use at 20  . Alcohol use No  . Drug use: No  . Sexual activity: Not Currently

## 2016-09-30 ENCOUNTER — Encounter: Payer: Self-pay | Admitting: Family Medicine

## 2016-09-30 ENCOUNTER — Ambulatory Visit (INDEPENDENT_AMBULATORY_CARE_PROVIDER_SITE_OTHER): Payer: Medicare Other | Admitting: Family Medicine

## 2016-09-30 VITALS — BP 110/68 | HR 88 | Temp 97.6°F | Resp 16 | Ht 68.0 in | Wt 133.0 lb

## 2016-09-30 DIAGNOSIS — R296 Repeated falls: Secondary | ICD-10-CM

## 2016-09-30 DIAGNOSIS — E559 Vitamin D deficiency, unspecified: Secondary | ICD-10-CM | POA: Diagnosis not present

## 2016-09-30 DIAGNOSIS — E039 Hypothyroidism, unspecified: Secondary | ICD-10-CM

## 2016-09-30 DIAGNOSIS — E785 Hyperlipidemia, unspecified: Secondary | ICD-10-CM | POA: Diagnosis not present

## 2016-09-30 DIAGNOSIS — N182 Chronic kidney disease, stage 2 (mild): Secondary | ICD-10-CM

## 2016-09-30 DIAGNOSIS — I10 Essential (primary) hypertension: Secondary | ICD-10-CM | POA: Diagnosis not present

## 2016-09-30 NOTE — Patient Instructions (Signed)
Change the risperdone as we discussed  Need blood work today  See me in 3 months  Come or call sooner for problems

## 2016-09-30 NOTE — Progress Notes (Signed)
Chief Complaint  Patient presents with  . Follow-up  is out of cast Not good about brace It is recommended for 2 weeks while up  Due for blood work.  Last done in Feb. He has no new complaints His caregiver [name] says he is more restless in the evening.  We discussed the risperdal.  She will give in am and after lunch to see if this helps in the evening. No increase in dose, just the timing. He is still loosing weight.  Is fed well.  Gets boost 1-2 times a day.  No problem with bowels or bladder. No observed trouble swallowing. BP is good No new falls Still goes to senior center   Patient Active Problem List   Diagnosis Date Noted  . CKD (chronic kidney disease) stage 2, GFR 60-89 ml/min 06/18/2016  . Gait instability 06/14/2016  . Aortic valve calcification 04/07/2016  . History of myocardial infarction 04/07/2016  . GERD (gastroesophageal reflux disease) 10/04/2014  . Dementia 04/16/2013  . Hyperlipidemia 04/16/2013  . Esophageal dysphagia 06/23/2012  . Essential hypertension 04/08/2012  . Hypothyroidism 10/15/2009  . Psoriasis 10/15/2009  . PAD (peripheral artery disease) ([place]) 09/11/2009  . Benign esophageal stricture 09/11/2009  . DIVERTICULAR DISEASE 09/11/2009    Outpatient Encounter Prescriptions as of 09/30/2016  Medication Sig  . acetaminophen (TYLENOL) 325 MG tablet Take 2 tablets (650 mg total) by mouth every 6 (six) hours as needed for moderate pain.  Marland Kitchen aspirin 81 MG tablet Take 81 mg by mouth daily.    Marland Kitchen atorvastatin (LIPITOR) 20 MG tablet TAKE ONE-HALF TABLET BY MOUTH ONCE DAILY  . levothyroxine (SYNTHROID, LEVOTHROID) 112 MCG tablet TAKE ONE TABLET BY MOUTH ONCE DAILY BEFORE BREAKFAST  . nitroGLYCERIN (NITROSTAT) 0.4 MG SL tablet Place 1 tablet (0.4 mg total) under the tongue every 5 (five) minutes as needed for chest pain.  . pantoprazole (PROTONIX) 20 MG tablet Take 1 tablet (20 mg total) by mouth daily.  . polyethylene glycol (MIRALAX / GLYCOLAX) packet  Take 17 g by mouth 2 (two) times daily.  . risperiDONE (RISPERDAL) 0.5 MG tablet Take 1 tablet (0.5 mg total) by mouth 2 (two) times daily.  . Triamcinolone Acetonide (TRIAMCINOLONE 0.1 % CREAM : EUCERIN) CREA Apply 1 application topically 2 (two) times daily as needed.   No facility-administered encounter medications on file as of 09/30/2016.     No Known Allergies  Review of Systems  Unable to perform ROS: Dementia   Per caregiver :  Restless, behaviors, weight loss, falls.  Nothing new.   BP 110/68 (BP Location: Right Arm, Patient Position: Sitting, Cuff Size: Normal)   Pulse 88   Temp 97.6 F (36.4 C) (Temporal)   Resp 16   Ht 5\' 8"  (1.727 m)   Wt 133 lb (60.3 kg)   SpO2 100%   BMI 20.22 kg/m   Physical Exam  Constitutional: He appears well-developed. No distress.  Thin, balance poor, small steps.  NAD  HENT:  Head: Normocephalic.  Dentures  Eyes: Pupils are equal, round, and reactive to light. Conjunctivae are normal.  Neck: Normal range of motion.  Cardiovascular: Normal rate, regular rhythm and normal heart sounds.   occas ectopy  Pulmonary/Chest: Effort normal and breath sounds normal.  Musculoskeletal: Normal range of motion. He exhibits no edema.  No focal weakness, general atrophy.    Neurological: He is alert. He displays normal reflexes. Coordination abnormal.  Skin:  Psoriasis, mild, noted on elbows  Psychiatric: His behavior is normal.  His speech is delayed. He exhibits abnormal recent memory and abnormal remote memory.  Quiet.  Answers simple questions correctly, memory impaired.  He is inattentive.    ASSESSMENT/PLAN:  1. Essential hypertension controlled  2. Hypothyroidism, unspecified type - TSH  3. CKD (chronic kidney disease) stage 2, GFR 60-89 ml/min - COMPLETE METABOLIC PANEL WITH GFR - CBC - Urinalysis, Routine w reflex microscopic  4. Hyperlipidemia, unspecified hyperlipidemia type - Lipid panel  5. Falls frequently  6. Vitamin D  deficiency - VITAMIN D 25 Hydroxy (Vit-D Deficiency, Fractures)   Patient Instructions  Change the risperdone as we discussed  Need blood work today  See me in 3 months  Come or call sooner for problems   Raylene Everts, MD

## 2016-10-04 DIAGNOSIS — N182 Chronic kidney disease, stage 2 (mild): Secondary | ICD-10-CM | POA: Diagnosis not present

## 2016-10-04 DIAGNOSIS — E785 Hyperlipidemia, unspecified: Secondary | ICD-10-CM | POA: Diagnosis not present

## 2016-10-04 DIAGNOSIS — I10 Essential (primary) hypertension: Secondary | ICD-10-CM | POA: Diagnosis not present

## 2016-10-04 DIAGNOSIS — E559 Vitamin D deficiency, unspecified: Secondary | ICD-10-CM | POA: Diagnosis not present

## 2016-10-04 DIAGNOSIS — E039 Hypothyroidism, unspecified: Secondary | ICD-10-CM | POA: Diagnosis not present

## 2016-10-04 DIAGNOSIS — R296 Repeated falls: Secondary | ICD-10-CM | POA: Diagnosis not present

## 2016-10-05 ENCOUNTER — Other Ambulatory Visit: Payer: Self-pay | Admitting: Family Medicine

## 2016-10-05 LAB — CBC WITH DIFFERENTIAL/PLATELET
Basophils Absolute: 32 cells/uL (ref 0–200)
Basophils Relative: 0.6 %
EOS ABS: 48 {cells}/uL (ref 15–500)
Eosinophils Relative: 0.9 %
HCT: 34.6 % — ABNORMAL LOW (ref 38.5–50.0)
Hemoglobin: 11.8 g/dL — ABNORMAL LOW (ref 13.2–17.1)
Lymphs Abs: 800 cells/uL — ABNORMAL LOW (ref 850–3900)
MCH: 31.4 pg (ref 27.0–33.0)
MCHC: 34.1 g/dL (ref 32.0–36.0)
MCV: 92 fL (ref 80.0–100.0)
MPV: 11.9 fL (ref 7.5–12.5)
Monocytes Relative: 8.3 %
NEUTROS ABS: 3980 {cells}/uL (ref 1500–7800)
NEUTROS PCT: 75.1 %
PLATELETS: 121 10*3/uL — AB (ref 140–400)
RBC: 3.76 10*6/uL — ABNORMAL LOW (ref 4.20–5.80)
RDW: 13.1 % (ref 11.0–15.0)
TOTAL LYMPHOCYTE: 15.1 %
WBC: 5.3 10*3/uL (ref 3.8–10.8)
WBCMIX: 440 {cells}/uL (ref 200–950)

## 2016-10-05 LAB — LIPID PANEL
CHOL/HDL RATIO: 2.9 (calc) (ref <5.0)
Cholesterol: 105 mg/dL (ref <200)
HDL: 36 mg/dL — AB (ref >40)
LDL CHOLESTEROL (CALC): 51 mg/dL
NON-HDL CHOLESTEROL (CALC): 69 mg/dL (ref <130)
TRIGLYCERIDES: 92 mg/dL (ref <150)

## 2016-10-05 LAB — COMPLETE METABOLIC PANEL WITH GFR
AG RATIO: 1.4 (calc) (ref 1.0–2.5)
ALT: 15 U/L (ref 9–46)
AST: 17 U/L (ref 10–35)
Albumin: 3.8 g/dL (ref 3.6–5.1)
Alkaline phosphatase (APISO): 100 U/L (ref 40–115)
BILIRUBIN TOTAL: 0.5 mg/dL (ref 0.2–1.2)
BUN: 12 mg/dL (ref 7–25)
CO2: 31 mmol/L (ref 20–32)
Calcium: 9.5 mg/dL (ref 8.6–10.3)
Chloride: 103 mmol/L (ref 98–110)
Creat: 0.94 mg/dL (ref 0.70–1.11)
GFR, EST AFRICAN AMERICAN: 84 mL/min/{1.73_m2} (ref >=60)
GFR, EST NON AFRICAN AMERICAN: 73 mL/min/{1.73_m2} (ref >=60)
Globulin: 2.8 g/dL (calc) (ref 1.9–3.7)
Glucose, Bld: 135 mg/dL (ref 65–139)
Potassium: 4.4 mmol/L (ref 3.5–5.3)
Sodium: 141 mmol/L (ref 135–146)
Total Protein: 6.6 g/dL (ref 6.1–8.1)

## 2016-10-05 LAB — URINALYSIS, ROUTINE W REFLEX MICROSCOPIC
BILIRUBIN URINE: NEGATIVE
GLUCOSE, UA: NEGATIVE
HGB URINE DIPSTICK: NEGATIVE
KETONES UR: NEGATIVE
Leukocytes, UA: NEGATIVE
Nitrite: NEGATIVE
PROTEIN: NEGATIVE
Specific Gravity, Urine: 1.012 (ref 1.001–1.03)
pH: 7 (ref 5.0–8.0)

## 2016-10-05 LAB — TSH: TSH: 0.07 mIU/L — ABNORMAL LOW (ref 0.40–4.50)

## 2016-10-05 LAB — VITAMIN D 25 HYDROXY (VIT D DEFICIENCY, FRACTURES): Vit D, 25-Hydroxy: 50 ng/mL (ref 30–100)

## 2016-10-05 MED ORDER — LEVOTHYROXINE SODIUM 100 MCG PO TABS: 100.0000 ug | ORAL_TABLET | Freq: Every day | ORAL | 0 refills | Status: DC

## 2016-10-07 ENCOUNTER — Telehealth: Payer: Self-pay | Admitting: Family Medicine

## 2016-10-07 MED ORDER — RISPERIDONE 0.5 MG PO TABS: 0.5000 mg | ORAL_TABLET | Freq: Two times a day (BID) | ORAL | 0 refills | Status: DC

## 2016-10-07 NOTE — Telephone Encounter (Signed)
Voice mail message requesting Rx Risperidone She checked with [facility] they said they had not heard back from doctor yet and for her to call and check with Dr. Francesca Oman office

## 2016-10-08 ENCOUNTER — Other Ambulatory Visit: Payer: Self-pay | Admitting: Family Medicine

## 2016-10-08 MED ORDER — RISPERIDONE 0.5 MG PO TABS: 0.5000 mg | ORAL_TABLET | Freq: Two times a day (BID) | ORAL | 0 refills | Status: DC

## 2016-10-11 DIAGNOSIS — Z23 Encounter for immunization: Secondary | ICD-10-CM | POA: Diagnosis not present

## 2016-11-04 ENCOUNTER — Other Ambulatory Visit: Payer: Self-pay | Admitting: Family Medicine

## 2016-11-09 MED ORDER — RISPERIDONE 0.5 MG PO TABS: 0.5000 mg | ORAL_TABLET | Freq: Two times a day (BID) | ORAL | 3 refills | Status: AC

## 2016-11-09 NOTE — Telephone Encounter (Signed)
Is this going to be a long term med for him?

## 2016-11-09 NOTE — Telephone Encounter (Signed)
Probably

## 2016-11-23 DIAGNOSIS — D485 Neoplasm of uncertain behavior of skin: Secondary | ICD-10-CM | POA: Diagnosis not present

## 2016-11-23 DIAGNOSIS — L218 Other seborrheic dermatitis: Secondary | ICD-10-CM | POA: Diagnosis not present

## 2016-11-23 DIAGNOSIS — D225 Melanocytic nevi of trunk: Secondary | ICD-10-CM | POA: Diagnosis not present

## 2016-11-23 DIAGNOSIS — L821 Other seborrheic keratosis: Secondary | ICD-10-CM | POA: Diagnosis not present

## 2016-11-23 DIAGNOSIS — L814 Other melanin hyperpigmentation: Secondary | ICD-10-CM | POA: Diagnosis not present

## 2016-11-23 DIAGNOSIS — L820 Inflamed seborrheic keratosis: Secondary | ICD-10-CM | POA: Diagnosis not present

## 2016-11-27 DIAGNOSIS — L821 Other seborrheic keratosis: Secondary | ICD-10-CM | POA: Diagnosis not present

## 2016-12-21 ENCOUNTER — Telehealth: Payer: Self-pay | Admitting: Family Medicine

## 2016-12-21 NOTE — Telephone Encounter (Signed)
[name]- patient's care giver Cb#: [phone]  She is requesting to speak to Dr.Nelson, patient has fallen at least once a week for the past several weeks. She states he wont use his cain or his walker and he is to weak to walk. She is seeking advise.  Patient just fell again and she had to get help from a neighbor to get him up.

## 2016-12-22 ENCOUNTER — Other Ambulatory Visit: Payer: Self-pay

## 2016-12-22 ENCOUNTER — Encounter: Payer: Self-pay | Admitting: Family Medicine

## 2016-12-22 ENCOUNTER — Ambulatory Visit (INDEPENDENT_AMBULATORY_CARE_PROVIDER_SITE_OTHER): Payer: Medicare Other | Admitting: Family Medicine

## 2016-12-22 VITALS — BP 110/64 | HR 92 | Temp 98.2°F | Resp 20 | Ht 68.0 in

## 2016-12-22 DIAGNOSIS — R2681 Unsteadiness on feet: Secondary | ICD-10-CM | POA: Diagnosis not present

## 2016-12-22 DIAGNOSIS — G301 Alzheimer's disease with late onset: Secondary | ICD-10-CM | POA: Diagnosis not present

## 2016-12-22 DIAGNOSIS — J181 Lobar pneumonia, unspecified organism: Secondary | ICD-10-CM

## 2016-12-22 DIAGNOSIS — F0281 Dementia in other diseases classified elsewhere with behavioral disturbance: Secondary | ICD-10-CM

## 2016-12-22 DIAGNOSIS — J189 Pneumonia, unspecified organism: Secondary | ICD-10-CM

## 2016-12-22 DIAGNOSIS — R296 Repeated falls: Secondary | ICD-10-CM

## 2016-12-22 DIAGNOSIS — F02818 Dementia in other diseases classified elsewhere, unspecified severity, with other behavioral disturbance: Secondary | ICD-10-CM

## 2016-12-22 MED ORDER — AZITHROMYCIN 250 MG PO TABS: ORAL_TABLET | ORAL | 0 refills | Status: DC

## 2016-12-22 NOTE — Telephone Encounter (Signed)
I am seeing today

## 2016-12-22 NOTE — Patient Instructions (Signed)
Take the antibiotic as directed Push fluids GO TO ER IF WORSE  It is time to consider placement in a nursing facility.  I do not think it is appropriate or safe for him to continue living at home for much longer.  I will begin the process of p.lacement

## 2016-12-22 NOTE — Progress Notes (Signed)
Chief Complaint  Patient presents with  . Cough    x 3 days  . Fall   Raymond [name] is brought in by his aide Raymond [name] and by his significant other/caregiver/POA Raymond [name].  They have a couple of issues to discuss. First, he has a terrible cough.  Is been going on for over a week.  He is coughing up yellow sputum.  He seems tired.Marland Kitchen  He is little more weak.  His appetite is still good.  They have not noticed any fever or chills.  He does not have any runny or stuffy nose.  Does not appear to have sore throat.  They have not noticed any shortness of breath or wheezing. He also has been becoming more weak for the last couple of months, with his legs tremulous when he tries to stand and walk.  He has had multiple falls.  At least 6.  Once he has fallen, he is unable to help himself to stand and it requires 2 people to get him up.  He is having difficulty walking.  He does not like his walker.  He throws it away.  He does not remember to use it.  He is unable to learn new behaviors.  He is unable to remember instructions.  He is, in my opinion, at this time a 2 person left and requires 2 people at all time for appropriate care.  He does have Raymond [name] there most of the time, and the aide there 3 days a week.  Raymond [name] is elderly as well, and is exhausted and unable to manage by herself.  I explained to her that this is a normal progression of his disease, and that they could either make him wheelchair-bound, bedbound, or find placement for him.  For his health and safety, his quality of life would probably be better in a facility.  I do not believe he can safely be cared for at home by 1 elderly caregiver, especially with multiple falls, especially with injuries.  I do not believe that another course of physical therapy is going to appreciably strengthen him.   Patient Active Problem List   Diagnosis Date Noted  . CKD (chronic kidney disease) stage 2, GFR 60-89 ml/min 06/18/2016  . Gait instability 06/14/2016  . Aortic valve  calcification 04/07/2016  . History of myocardial infarction 04/07/2016  . GERD (gastroesophageal reflux disease) 10/04/2014  . Dementia 04/16/2013  . Hyperlipidemia 04/16/2013  . Esophageal dysphagia 06/23/2012  . Essential hypertension 04/08/2012  . Hypothyroidism 10/15/2009  . Psoriasis 10/15/2009  . PAD (peripheral artery disease) ([place]) 09/11/2009  . Benign esophageal stricture 09/11/2009  . DIVERTICULAR DISEASE 09/11/2009    Outpatient Encounter Medications as of 12/22/2016  Medication Sig  . acetaminophen (TYLENOL) 325 MG tablet Take 2 tablets (650 mg total) by mouth every 6 (six) hours as needed for moderate pain.  Marland Kitchen aspirin 81 MG tablet Take 81 mg by mouth daily.    Marland Kitchen atorvastatin (LIPITOR) 20 MG tablet TAKE ONE-HALF TABLET BY MOUTH ONCE DAILY  . levothyroxine (SYNTHROID, LEVOTHROID) 100 MCG tablet Take 1 tablet (100 mcg total) by mouth daily before breakfast.  . nitroGLYCERIN (NITROSTAT) 0.4 MG SL tablet Place 1 tablet (0.4 mg total) under the tongue every 5 (five) minutes as needed for chest pain.  . pantoprazole (PROTONIX) 20 MG tablet Take 1 tablet (20 mg total) by mouth daily.  . polyethylene glycol (MIRALAX / GLYCOLAX) packet Take 17 g by mouth 2 (two) times daily.  . risperiDONE (RISPERDAL)  0.5 MG tablet Take 1 tablet (0.5 mg total) by mouth 2 (two) times daily.  . Triamcinolone Acetonide (TRIAMCINOLONE 0.1 % CREAM : EUCERIN) CREA Apply 1 application topically 2 (two) times daily as needed.  Marland Kitchen azithromycin (ZITHROMAX) 250 MG tablet tad   No facility-administered encounter medications on file as of 12/22/2016.     No Known Allergies  Review of Systems  Constitutional: Negative for activity change, appetite change, chills, fever and unexpected weight change.  HENT: Negative for congestion and rhinorrhea.   Eyes: Negative for visual disturbance.  Respiratory: Positive for cough. Negative for choking, shortness of breath and wheezing.   Cardiovascular: Negative for  chest pain.  Gastrointestinal: Negative for diarrhea and vomiting.  Musculoskeletal: Positive for gait problem.  Neurological: Positive for weakness. Negative for dizziness and light-headedness.  Psychiatric/Behavioral: Negative for agitation and behavioral problems.  Review of systems is from the caregiver and his aide, patient unable to provide information/dementia   BP 110/64 (BP Location: Right Arm, Patient Position: Sitting, Cuff Size: Normal)   Pulse 92   Temp 98.2 F (36.8 C) (Temporal)   Resp 20   Ht 5\' 8"  (1.727 m)   SpO2 98%   BMI 20.22 kg/m   Physical Exam  Constitutional: He appears well-developed. No distress.  Thin, balance poor, small steps.    HENT:  Head: Normocephalic.  Mouth/Throat: Oropharynx is clear and moist.  Dentures  Eyes: Conjunctivae are normal. Pupils are equal, round, and reactive to light.  Neck: Normal range of motion.  Cardiovascular: Normal rate, regular rhythm and normal heart sounds.  occas ectopy  Pulmonary/Chest: He has wheezes. He has rales.  Rales and rhonchi more prominent right than left base, scattered wheeze, frequent harsh cough  Musculoskeletal: Normal range of motion. He exhibits no edema.  No focal weakness, general weakness and atrophy.    Neurological: He is alert. He displays normal reflexes. Coordination abnormal.  Skin:  Psoriasis, mild, noted on elbows  Psychiatric: His behavior is normal. His speech is delayed. He exhibits abnormal recent memory and abnormal remote memory.  Quiet.  Answers simple questions correctly, memory impaired.  Inattentive to conversation in room, unaware of discussion He is inattentive.    ASSESSMENT/PLAN:   Community acquired pneumonia of right lower lobe of lung ([place])  Late onset Alzheimer's disease with behavioral disturbance  Gait instability  Falls frequently  Discussed possibility of sending [name] to the hospital today for likely pneumonia.  He does not meet criteria for  hospital admission with hypoxia, or unstable vital signs.  Family agrees to take him home with antibiotics, returning to the hospital only if he worsens. Patient's POA Raymond [name] wishes to call the son and daughter-in-law to discuss my recommendations that he is placed in a nursing home or dementia facility.  We can process the [place] 2 form and make recommendations for placement.  Greater than 50% of this visit was spent in counseling and coordinating care.  Total face to face time:   40 Minutes , discussing dementia, progression of disease, placement into nursing home   Patient Instructions  Take the antibiotic as directed Push fluids GO TO ER IF WORSE  It is time to consider placement in a nursing facility.  I do not think it is appropriate or safe for him to continue living at home for much longer.  I will begin the process of p.lacement   Raylene Everts, MD

## 2016-12-27 ENCOUNTER — Telehealth: Payer: Self-pay | Admitting: Family Medicine

## 2016-12-27 ENCOUNTER — Telehealth: Payer: Self-pay

## 2016-12-27 NOTE — Telephone Encounter (Signed)
-----   Message from Raylene Everts, MD sent at 12/23/2016  9:45 AM EST ----- Please call [name] Avala his POA) see how Wille Glaser is doing today.  Also discussed with her my recommendations that she look at [place], or [place] for placement.  Nanine Means has the [employer] benefit.  Her mobile number is [phone].

## 2016-12-27 NOTE — Telephone Encounter (Signed)
Please get his name and number.  He needs a reality check

## 2016-12-27 NOTE — Telephone Encounter (Signed)
Mrs Nicky Pugh calling to give a heads up, that the son will be calling in to discuss the assisted living, he is not happy

## 2016-12-27 NOTE — Telephone Encounter (Signed)
Called and spoke to [place], given mds advice, advised to call with any further needs.

## 2016-12-28 NOTE — Telephone Encounter (Signed)
Patients son Taevin Mcferran 01/31/1926 left a msg on machine, requesting to speak to Dr.Nelson about patients care. Cb#: [phone]

## 2016-12-29 NOTE — Telephone Encounter (Signed)
Called and LM for son.

## 2016-12-30 ENCOUNTER — Ambulatory Visit: Payer: Medicare Other | Admitting: Family Medicine

## 2017-01-07 ENCOUNTER — Ambulatory Visit: Payer: Medicare Other | Admitting: Family Medicine

## 2017-01-18 ENCOUNTER — Telehealth: Payer: Self-pay | Admitting: Family Medicine

## 2017-01-18 NOTE — Telephone Encounter (Signed)
[name] left message asking Geraldo Pitter to return call [phone]

## 2017-01-18 NOTE — Telephone Encounter (Signed)
Called again and left message for son to call me.

## 2017-01-18 NOTE — Telephone Encounter (Signed)
Spoke to lula in person.

## 2017-01-19 ENCOUNTER — Telehealth: Payer: Self-pay | Admitting: Family Medicine

## 2017-01-19 NOTE — Telephone Encounter (Signed)
Pt's son is calling back in returning a call from Dr Meda Coffee, he was at work, if you leave him a msg with the best time to call you..Cell [phone]

## 2017-01-20 NOTE — Telephone Encounter (Signed)
Called and left message.

## 2017-01-21 ENCOUNTER — Telehealth: Payer: Self-pay | Admitting: Family Medicine

## 2017-01-21 NOTE — Telephone Encounter (Signed)
[name]  Is calling to advise that the [facility] will call today at 11:45 to talk to Dr Meda Coffee

## 2017-01-21 NOTE — Telephone Encounter (Signed)
I spoke with [name] Junior regarding his father's care.  Joe insists that he Marcello Moores his father he would never put him in a nursing home and does not want him to be placed.  He feels that his father has the financial ability to have 24-hour nursing in his home and would like for [place] to look into a nursing agency that would provide this care.  I explained to him that his father needed 24-hour care and that his girlfriend [name] could no longer be the primary caregiver to provide this for him.  [name] does have power of attorney and will be the ultimate person to make the decision regarding his placement.  I reassured the son that everything was being done to make sure his father was well cared for, and safe.

## 2017-01-31 ENCOUNTER — Other Ambulatory Visit: Payer: Self-pay | Admitting: Family Medicine

## 2017-03-04 ENCOUNTER — Other Ambulatory Visit: Payer: Self-pay | Admitting: Gastroenterology

## 2017-03-08 ENCOUNTER — Emergency Department (HOSPITAL_COMMUNITY): Payer: Medicare Other

## 2017-03-08 ENCOUNTER — Encounter (HOSPITAL_COMMUNITY): Payer: Self-pay | Admitting: Emergency Medicine

## 2017-03-08 DIAGNOSIS — E870 Hyperosmolality and hypernatremia: Secondary | ICD-10-CM | POA: Diagnosis not present

## 2017-03-08 DIAGNOSIS — I2510 Atherosclerotic heart disease of native coronary artery without angina pectoris: Secondary | ICD-10-CM | POA: Diagnosis present

## 2017-03-08 DIAGNOSIS — N183 Chronic kidney disease, stage 3 (moderate): Secondary | ICD-10-CM | POA: Diagnosis present

## 2017-03-08 DIAGNOSIS — E876 Hypokalemia: Secondary | ICD-10-CM | POA: Diagnosis not present

## 2017-03-08 DIAGNOSIS — I130 Hypertensive heart and chronic kidney disease with heart failure and stage 1 through stage 4 chronic kidney disease, or unspecified chronic kidney disease: Secondary | ICD-10-CM | POA: Diagnosis present

## 2017-03-08 DIAGNOSIS — Z79899 Other long term (current) drug therapy: Secondary | ICD-10-CM

## 2017-03-08 DIAGNOSIS — I5041 Acute combined systolic (congestive) and diastolic (congestive) heart failure: Secondary | ICD-10-CM | POA: Diagnosis present

## 2017-03-08 DIAGNOSIS — I2129 ST elevation (STEMI) myocardial infarction involving other sites: Principal | ICD-10-CM | POA: Diagnosis present

## 2017-03-08 DIAGNOSIS — S0990XA Unspecified injury of head, initial encounter: Secondary | ICD-10-CM | POA: Diagnosis not present

## 2017-03-08 DIAGNOSIS — A419 Sepsis, unspecified organism: Secondary | ICD-10-CM | POA: Diagnosis present

## 2017-03-08 DIAGNOSIS — I219 Acute myocardial infarction, unspecified: Secondary | ICD-10-CM | POA: Diagnosis not present

## 2017-03-08 DIAGNOSIS — Z8249 Family history of ischemic heart disease and other diseases of the circulatory system: Secondary | ICD-10-CM

## 2017-03-08 DIAGNOSIS — R079 Chest pain, unspecified: Secondary | ICD-10-CM | POA: Diagnosis not present

## 2017-03-08 DIAGNOSIS — D638 Anemia in other chronic diseases classified elsewhere: Secondary | ICD-10-CM | POA: Diagnosis present

## 2017-03-08 DIAGNOSIS — I10 Essential (primary) hypertension: Secondary | ICD-10-CM | POA: Diagnosis present

## 2017-03-08 DIAGNOSIS — I4891 Unspecified atrial fibrillation: Secondary | ICD-10-CM | POA: Diagnosis not present

## 2017-03-08 DIAGNOSIS — F0391 Unspecified dementia with behavioral disturbance: Secondary | ICD-10-CM | POA: Diagnosis not present

## 2017-03-08 DIAGNOSIS — R296 Repeated falls: Secondary | ICD-10-CM | POA: Diagnosis present

## 2017-03-08 DIAGNOSIS — F419 Anxiety disorder, unspecified: Secondary | ICD-10-CM | POA: Diagnosis present

## 2017-03-08 DIAGNOSIS — I959 Hypotension, unspecified: Secondary | ICD-10-CM | POA: Diagnosis present

## 2017-03-08 DIAGNOSIS — K219 Gastro-esophageal reflux disease without esophagitis: Secondary | ICD-10-CM | POA: Diagnosis present

## 2017-03-08 DIAGNOSIS — N182 Chronic kidney disease, stage 2 (mild): Secondary | ICD-10-CM | POA: Diagnosis present

## 2017-03-08 DIAGNOSIS — Z66 Do not resuscitate: Secondary | ICD-10-CM | POA: Diagnosis present

## 2017-03-08 DIAGNOSIS — S299XXA Unspecified injury of thorax, initial encounter: Secondary | ICD-10-CM | POA: Diagnosis not present

## 2017-03-08 DIAGNOSIS — Z9049 Acquired absence of other specified parts of digestive tract: Secondary | ICD-10-CM

## 2017-03-08 DIAGNOSIS — F05 Delirium due to known physiological condition: Secondary | ICD-10-CM | POA: Diagnosis not present

## 2017-03-08 DIAGNOSIS — E440 Moderate protein-calorie malnutrition: Secondary | ICD-10-CM | POA: Diagnosis present

## 2017-03-08 DIAGNOSIS — S2190XA Unspecified open wound of unspecified part of thorax, initial encounter: Secondary | ICD-10-CM | POA: Diagnosis not present

## 2017-03-08 DIAGNOSIS — Z87891 Personal history of nicotine dependence: Secondary | ICD-10-CM

## 2017-03-08 DIAGNOSIS — F0390 Unspecified dementia without behavioral disturbance: Secondary | ICD-10-CM | POA: Diagnosis present

## 2017-03-08 DIAGNOSIS — J9601 Acute respiratory failure with hypoxia: Secondary | ICD-10-CM | POA: Diagnosis present

## 2017-03-08 DIAGNOSIS — D696 Thrombocytopenia, unspecified: Secondary | ICD-10-CM | POA: Diagnosis present

## 2017-03-08 DIAGNOSIS — E785 Hyperlipidemia, unspecified: Secondary | ICD-10-CM | POA: Diagnosis present

## 2017-03-08 DIAGNOSIS — E039 Hypothyroidism, unspecified: Secondary | ICD-10-CM | POA: Diagnosis present

## 2017-03-08 DIAGNOSIS — I252 Old myocardial infarction: Secondary | ICD-10-CM

## 2017-03-08 DIAGNOSIS — Z681 Body mass index (BMI) 19 or less, adult: Secondary | ICD-10-CM

## 2017-03-08 DIAGNOSIS — T68XXXA Hypothermia, initial encounter: Secondary | ICD-10-CM | POA: Diagnosis present

## 2017-03-08 DIAGNOSIS — L8990 Pressure ulcer of unspecified site, unspecified stage: Secondary | ICD-10-CM

## 2017-03-08 DIAGNOSIS — Z515 Encounter for palliative care: Secondary | ICD-10-CM | POA: Diagnosis not present

## 2017-03-08 DIAGNOSIS — Z7982 Long term (current) use of aspirin: Secondary | ICD-10-CM

## 2017-03-08 DIAGNOSIS — Z7189 Other specified counseling: Secondary | ICD-10-CM

## 2017-03-08 DIAGNOSIS — Z955 Presence of coronary angioplasty implant and graft: Secondary | ICD-10-CM

## 2017-03-08 DIAGNOSIS — Z79890 Hormone replacement therapy (postmenopausal): Secondary | ICD-10-CM

## 2017-03-08 LAB — CBC WITH DIFFERENTIAL/PLATELET
Basophils Absolute: 0 10*3/uL (ref 0.0–0.1)
Basophils Relative: 0 %
Eosinophils Absolute: 0.1 10*3/uL (ref 0.0–0.7)
Eosinophils Relative: 1 %
HEMATOCRIT: 33.4 % — AB (ref 39.0–52.0)
HEMOGLOBIN: 11.1 g/dL — AB (ref 13.0–17.0)
LYMPHS ABS: 1.4 10*3/uL (ref 0.7–4.0)
LYMPHS PCT: 14 %
MCH: 32 pg (ref 26.0–34.0)
MCHC: 33.2 g/dL (ref 30.0–36.0)
MCV: 96.3 fL (ref 78.0–100.0)
MONOS PCT: 6 %
Monocytes Absolute: 0.6 10*3/uL (ref 0.1–1.0)
NEUTROS ABS: 7.6 10*3/uL (ref 1.7–7.7)
NEUTROS PCT: 79 %
Platelets: 108 10*3/uL — ABNORMAL LOW (ref 150–400)
RBC: 3.47 MIL/uL — AB (ref 4.22–5.81)
RDW: 13.7 % (ref 11.5–15.5)
WBC: 9.7 10*3/uL (ref 4.0–10.5)

## 2017-03-08 LAB — I-STAT CHEM 8, ED
BUN: 14 mg/dL (ref 6–20)
CALCIUM ION: 1.09 mmol/L — AB (ref 1.15–1.40)
CREATININE: 1.1 mg/dL (ref 0.61–1.24)
Chloride: 105 mmol/L (ref 101–111)
Glucose, Bld: 148 mg/dL — ABNORMAL HIGH (ref 65–99)
HEMATOCRIT: 33 % — AB (ref 39.0–52.0)
Hemoglobin: 11.2 g/dL — ABNORMAL LOW (ref 13.0–17.0)
Potassium: 4.1 mmol/L (ref 3.5–5.1)
Sodium: 141 mmol/L (ref 135–145)
TCO2: 24 mmol/L (ref 22–32)

## 2017-03-08 LAB — LIPID PANEL
Cholesterol: 110 mg/dL (ref 0–200)
HDL: 35 mg/dL — AB (ref >40)
LDL CALC: 48 mg/dL (ref 0–99)
TRIGLYCERIDES: 135 mg/dL (ref <150)
Total CHOL/HDL Ratio: 3.1 RATIO
VLDL: 27 mg/dL (ref 0–40)

## 2017-03-08 LAB — COMPREHENSIVE METABOLIC PANEL
ALBUMIN: 3.3 g/dL — AB (ref 3.5–5.0)
ALK PHOS: 84 U/L (ref 38–126)
ALT: 23 U/L (ref 17–63)
ANION GAP: 10 (ref 5–15)
AST: 39 U/L (ref 15–41)
BILIRUBIN TOTAL: 0.4 mg/dL (ref 0.3–1.2)
BUN: 13 mg/dL (ref 6–20)
CALCIUM: 8.4 mg/dL — AB (ref 8.9–10.3)
CO2: 21 mmol/L — AB (ref 22–32)
Chloride: 107 mmol/L (ref 101–111)
Creatinine, Ser: 1.17 mg/dL (ref 0.61–1.24)
GFR calc Af Amer: >60 mL/min (ref >60)
GFR calc non Af Amer: 54 mL/min — ABNORMAL LOW (ref >60)
GLUCOSE: 153 mg/dL — AB (ref 65–99)
POTASSIUM: 4 mmol/L (ref 3.5–5.1)
SODIUM: 138 mmol/L (ref 135–145)
TOTAL PROTEIN: 6.1 g/dL — AB (ref 6.5–8.1)

## 2017-03-08 LAB — APTT: aPTT: 26 seconds (ref 24–36)

## 2017-03-08 LAB — BRAIN NATRIURETIC PEPTIDE: B Natriuretic Peptide: 64 pg/mL (ref 0.0–100.0)

## 2017-03-08 LAB — PROTIME-INR
INR: 1.18
Prothrombin Time: 14.9 seconds (ref 11.4–15.2)

## 2017-03-08 LAB — TROPONIN I: Troponin I: <0.03 ng/mL (ref <0.03)

## 2017-03-08 LAB — I-STAT CG4 LACTIC ACID, ED: Lactic Acid, Venous: 3.61 mmol/L (ref 0.5–1.9)

## 2017-03-08 LAB — I-STAT TROPONIN, ED: TROPONIN I, POC: 0 ng/mL (ref 0.00–0.08)

## 2017-03-08 MED ORDER — HEPARIN SODIUM (PORCINE) 5000 UNIT/ML IJ SOLN
INTRAMUSCULAR | Status: AC
  Filled 2017-03-08: qty 1

## 2017-03-08 MED ORDER — ASPIRIN 300 MG RE SUPP
300.0000 mg | Freq: Once | RECTAL | Status: AC
  Administered 2017-03-08: 300 mg via RECTAL
  Filled 2017-03-08: qty 1

## 2017-03-08 MED ORDER — SODIUM CHLORIDE 0.9 % IV SOLN: INTRAVENOUS | Status: DC

## 2017-03-08 MED ORDER — SODIUM CHLORIDE 0.9 % IV BOLUS (SEPSIS): 500.0000 mL | Freq: Once | INTRAVENOUS | Status: DC

## 2017-03-08 MED ORDER — HEPARIN BOLUS VIA INFUSION
3000.0000 [IU] | Freq: Once | INTRAVENOUS | Status: AC
  Administered 2017-03-08: 3000 [IU] via INTRAVENOUS
  Filled 2017-03-08: qty 3000

## 2017-03-08 MED ORDER — HEPARIN (PORCINE) IN NACL 100-0.45 UNIT/ML-% IJ SOLN
1100.0000 [IU]/h | INTRAMUSCULAR | Status: DC
  Administered 2017-03-08: 700 [IU]/h via INTRAVENOUS
  Administered 2017-03-10: 750 [IU]/h via INTRAVENOUS
  Administered 2017-03-11: 850 [IU]/h via INTRAVENOUS
  Filled 2017-03-08 (×3): qty 250

## 2017-03-08 MED ORDER — VANCOMYCIN HCL IN DEXTROSE 1-5 GM/200ML-% IV SOLN
1000.0000 mg | Freq: Once | INTRAVENOUS | Status: AC
  Administered 2017-03-09: 1000 mg via INTRAVENOUS
  Filled 2017-03-08: qty 200

## 2017-03-08 MED ORDER — FENTANYL CITRATE (PF) 100 MCG/2ML IJ SOLN
25.0000 ug | Freq: Once | INTRAMUSCULAR | Status: AC
  Administered 2017-03-08: 25 ug via INTRAVENOUS

## 2017-03-08 MED ORDER — LORAZEPAM 2 MG/ML IJ SOLN
0.5000 mg | Freq: Once | INTRAMUSCULAR | Status: AC
  Administered 2017-03-08: 0.5 mg via INTRAVENOUS
  Filled 2017-03-08: qty 1

## 2017-03-08 MED ORDER — FENTANYL CITRATE (PF) 100 MCG/2ML IJ SOLN
INTRAMUSCULAR | Status: AC
  Filled 2017-03-08: qty 2

## 2017-03-08 MED ORDER — ASPIRIN 81 MG PO CHEW
324.0000 mg | CHEWABLE_TABLET | Freq: Once | ORAL | Status: DC
  Filled 2017-03-08: qty 4

## 2017-03-08 MED ORDER — PIPERACILLIN-TAZOBACTAM 3.375 G IVPB 30 MIN
3.3750 g | Freq: Once | INTRAVENOUS | Status: AC
  Administered 2017-03-09: 3.375 g via INTRAVENOUS
  Filled 2017-03-08: qty 50

## 2017-03-08 NOTE — ED Notes (Signed)
Code STEMI cancelled at 2201

## 2017-03-08 NOTE — Consult Note (Signed)
Cardiology Consultation:   Patient ID: Raymond [name]; [id number]; July 22, 1926   Admit date: 02/23/2017 Date of Consult: 03/04/2017  Primary Care Provider: Raylene Everts, MD Primary Cardiologist: Johnsie Cancel Primary Electrophysiologist:  n/a  Chief Complaint: chest pain, concern for STEMI  Patient Profile:   Raymond [name] is a 84 y.o. male with a hx of CAD including MI in 1999, dementia, hypertension, GERD, esophageal dysphasia who presents with chest pain and a fall.  History of Present Illness:   Patient is a poor historian due to advanced dementia, therefore history obtained from EMS.  Patient apparently developed chest pain at home and then fell while walking across the room.  Per EMS he did not have syncope.  EMS arrived and he was found to be cold and shivering despite being an 85 degree room.  In route to the ED he was hypotensive and received 1.5 L normal saline.  Was found to be in A. fib with heart rates in the low 100s.  EKG done in the field was concerning for STEMI with ST elevations in II III and aVF with significant depression in V1 through V4.  Cath Lab was activated.  On arrival to the ED vitals notable for blood pressure 90/70 heart rate of 100-110 in A. fib.  Patient unable to answer most questions, but denies active chest pain.  Repeat EKG showed significant improvement in ST segments.  Past Medical History:  Diagnosis Date  . Anemia   . Anxiety   . ASCVD (arteriosclerotic cardiovascular disease)    2 MI's in 1990s  . Cataract   . Chronic renal insufficiency, stage II (mild)    CrCl in the 60s  . Dementia   . Depression   . DJD (degenerative joint disease)   . Esophageal dysphagia    Secondary to Schatzki's ring with dilation x2, most recently in 06/09, negative for H. pylori  . Gallstones   . GERD (gastroesophageal reflux disease)   . HTN (hypertension)    BP meds stopped 2016 due to recurrent orthostatic hypotension  . Hyperlipidemia   . Hypothyroidism    . Mild cognitive impairment with memory loss   . Myocardial infarction [facility]) [id number]   Cardiac cath done but no other intervention that we know of  . Orthostatic hypotension   . Psoriasis   . Thrombocytopenia ([place])    Platelets 99K on 09/2009 labs  . Ulcerative proctitis ([place])    Dx: 2000, started Asacol at that time.  Was taken off asacol 2014 and has done fine since (Dr. Sydell Axon, [facility])    Past Surgical History:  Procedure Laterality Date  . APPENDECTOMY  1940  . CLEFT LIP REPAIR     As a child  . COLECTOMY  2004   Colonovesicular fistula secondary to diverticulitis requiring sigmoid colectomy and bladder repair  . COLONOSCOPY  06/2007   Dr. Olegario Messier: Few diverticula, normal anastomosiss/p sigmoid colectomy for diverticulitis  . colovesicular fistula  2004  . ERCP N/A 03/07/2016   Procedure: ENDOSCOPIC RETROGRADE CHOLANGIOPANCREATOGRAPHY (ERCP);  Surgeon: Doran Stabler, MD;  [facility] END[facility]COPY;  Service: Endoscopy;  Laterality: N/A;  . ERCP N/A 04/21/2016   Procedure: ENDOSCOPIC RETROGRADE CHOLANGIOPANCREATOGRAPHY (ERCP);  Surgeon: Doran Stabler, MD;  Location: [place];  Service: Gastroenterology;  Laterality: N/A;  . ESOPHAGEAL DILATION  06-23-07; 06/28/12   H pylori NEG.  2014-reactive gastropathy with surface erosion.  . ESOPHAGOGASTRODUODENOSCOPY  06/2007   Dr. Olegario Messier: symptomatic Schatzki ring,  s/p 28mm Savary dilator, Erosive duodenitis, s/p bx   . ESOPHAGOGASTRODUODENOSCOPY N/A 04/21/2016   Procedure: ESOPHAGOGASTRODUODENOSCOPY (EGD);  Surgeon: Doran Stabler, MD;  Location: [place];  Service: Gastroenterology;  Laterality: N/A;  . ESOPHAGOGASTRODUODENOSCOPY (EGD) WITH ESOPHAGEAL DILATION N/A 06/28/2012   HEN:IDPOEUMP cervical web and distal esophageal s/p dilation/Hiatal hernia. Antral erosions (reactive gastropathy, no H.pylori)  . HERNIA REPAIR    . NECK MASS EXCISION  1979     Inpatient Medications: Scheduled  Meds:  Continuous Infusions: . sodium chloride     PRN Meds:   Home Meds: Prior to Admission medications   Medication Sig Start Date End Date Taking? Authorizing Provider  acetaminophen (TYLENOL) 325 MG tablet Take 2 tablets (650 mg total) by mouth every 6 (six) hours as needed for moderate pain. 03/09/16   Doreatha Lew, MD  aspirin 81 MG tablet Take 81 mg by mouth daily.      [provider]  atorvastatin (LIPITOR) 20 MG tablet TAKE ONE-HALF TABLET BY MOUTH ONCE DAILY 07/18/14   McGowen, Adrian Blackwater, MD  azithromycin (ZITHROMAX) 250 MG tablet tad 12/22/16   Raylene Everts, MD  levothyroxine (SYNTHROID, LEVOTHROID) 100 MCG tablet TAKE 1 TABLET BY MOUTH ONCE DAILY BEFORE BREAKFAST 02/01/17   Raylene Everts, MD  nitroGLYCERIN (NITROSTAT) 0.4 MG SL tablet Place 1 tablet (0.4 mg total) under the tongue every 5 (five) minutes as needed for chest pain. 06/26/14   McGowen, Adrian Blackwater, MD  pantoprazole (PROTONIX) 20 MG tablet Take 1 tablet (20 mg total) by mouth daily. 07/20/16   Raylene Everts, MD  polyethylene glycol [facility] Floria Raveling) packet Take 17 g by mouth 2 (two) times daily. 08/24/16   Raylene Everts, MD  risperiDONE (RISPERDAL) 0.5 MG tablet Take 1 tablet (0.5 mg total) by mouth 2 (two) times daily. 11/09/16   Raylene Everts, MD  Triamcinolone Acetonide (TRIAMCINOLONE 0.1 % CREAM : EUCERIN) CREA Apply 1 application topically 2 (two) times daily as needed. 08/25/16   Raylene Everts, MD    Allergies:   No Known Allergies  Social History:   Social History   Socioeconomic History  . Marital status: Widowed    Spouse name: Not on file  . Number of children: 2  . Years of education: Not on file  . Highest education level: Not on file  Social Needs  . Financial resource strain: Not on file  . Food insecurity - worry: Not on file  . Food insecurity - inability: Not on file  . Transportation needs - medical: Not on file  . Transportation needs -  non-medical: Not on file  Occupational History  . Occupation: Retired from [employer]  . Smoking status: Former Smoker    Packs/day: 1.00    Years: 10.00    Pack years: 10.00    Types: Cigarettes    Last attempt to quit: 01/11/1970    Years since quitting: 47.1  . Smokeless tobacco: Never Used  . Tobacco comment: Minimal use at 20  Substance and Sexual Activity  . Alcohol use: No  . Drug use: No  . Sexual activity: Not Currently  Other Topics Concern  . Not on file  Social History Narrative   Widowed, has had girlfriend x 76 yrs, Lives in [place],    [place] one son who lives in [place].   Occupation: [employer] in [place].  Retired 1s.   No regular exercise.  Smoked in the WAY distant past.  Alcohol: none.     Girlfriend [name] "[name]" is living with him as primary caregiver and is POA    Family History:   The patient's family history includes Cancer in his father; Depression in his mother; Heart disease in his daughter; Pneumonia in his mother.  ROS:  Unable to obtain complete ROS due to mental status  Physical Exam/Data:   Vitals:   03/07/2017 2149 02/18/2017 2149 03/01/2017 2150 02/27/2017 2152  BP:  91/70  112/87  Pulse: 93 100 (!) 114   Resp: (!) 22 (!) 22 (!) 32 (!) 31  SpO2: 100% 100% 100%    No intake or output data in the 24 hours ending 02/18/2017 2217 BP 90/70, HR 105 There is no height or weight on file to calculate BMI.  General: Elderly male, frail appearing Head: Normocephalic, atraumatic, sclera non-icteric, no xanthomas, nares are without discharge.  Neck: Negative for carotid bruits. JVD not elevated. Lungs: Clear bilaterally to auscultation without wheezes, rales, or rhonchi. Breathing is unlabored. Heart: RRR with S1 S2.  Abdomen: Soft, non-tender, non-distended with normoactive bowel sounds. No hepatomegaly. No rebound/guarding. No obvious abdominal masses. Msk:  Strength and tone appear normal for age. Extremities: No clubbing or cyanosis. No  edema.   Neuro: Alert and oriented X 3. No facial asymmetry. No focal deficit. Moves all extremities spontaneously. Psych:  Does not answer most questions.  Follows basic commands (hand squeeze, etc)  EKG:  The EKG was personally reviewed and demonstrates atrial fibrillation, subtle ST elevation in II, III, aVF and ST depression in V1-V2.  Relevant CV Studies: NM SPECT 04/13/2016  This is an intermediate risk study.  Findings consistent with prior myocardial infarction.  The left ventricular ejection fraction is mildly decreased (45-54%).  There was no ST segment deviation noted during stress.   Large inferior wall infarct from apex to base with no ischemia EF 45% with inferior wall hypokinesis   Laboratory Data:  ChemistryNo results for input(s): NA, K, CL, CO2, GLUCOSE, BUN, CREATININE, CALCIUM, GFRNONAA, GFRAA, ANIONGAP in the last 168 hours.  No results for input(s): PROT, ALBUMIN, AST, ALT, ALKPHOS, BILITOT in the last 168 hours. HematologyNo results for input(s): WBC, RBC, HGB, HCT, MCV, MCH, MCHC, RDW, PLT in the last 168 hours. Cardiac EnzymesNo results for input(s): TROPONINI in the last 168 hours. No results for input(s): TROPIPOC in the last 168 hours.  BNPNo results for input(s): BNP, PROBNP in the last 168 hours.  DDimer No results for input(s): DDIMER in the last 168 hours.  Radiology/Studies:  Ct Head Wo Contrast  Result Date: 03/07/2017 CLINICAL DATA:  [place] at home EXAM: CT HEAD WITHOUT CONTRAST TECHNIQUE: Contiguous axial images were obtained from the base of the skull through the vertex without intravenous contrast. COMPARISON:  CT 09/21/2015 FINDINGS: Brain: No acute territorial infarction, hemorrhage or intracranial mass is visualized. Moderate severe small vessel ischemic changes of the white matter. Moderate atrophy. Stable ventricle size Vascular: No hyperdense vessels.  Carotid vascular calcification Skull: No depressed skull fracture. Thinning of the left  parietal bone Sinuses/Orbits: Mild mucosal thickening in the ethmoid sinuses. No acute orbital abnormality Other: None IMPRESSION: 1. No CT evidence for acute intracranial abnormality. 2. Atrophy and small vessel ischemic changes of the white matter Electronically Signed   By: Donavan Foil M.D.   On: 02/20/2017 22:13   Most recent ECG:    Assessment and Plan:   He is probably having an MI based on his ECG. He is not clearly able to  report symptoms due to his altered mental status.  There is also concern for sepsis given rigors, altered mental status, and hypotension.  Obtain further history from wife and daughter after their arrival.  Patient has advanced dementia and is not able to carry on a conversation at baseline.  He can dress himself but is otherwise requires 24-hour care.  His health has been declining recently.  After further discussion with the family, we have elected not to pursue cardiac catheterization due to his advanced age and severe dementia.  We will plan to treat with aspirin, heparin, and opioids as needed to control symptoms.   He is DNR.  Recommendations -Aspirin 325 now and 81 daily -IV heparin for ACS -Opioids as needed to control chest pain -hold beta blocker and NTG given hypotension -would obtain 2-3 sets of troponin -Evaluate and treat for sepsis   This case was discussed with Dr. Gwenlyn Found, interventional attending on call.  Liliane Bade, MD  02/15/2017 10:17 PM

## 2017-03-08 NOTE — ED Provider Notes (Signed)
[facility] EMERGENCY DEPARTMENT Provider Note   CSN: [id number] Arrival date & time: 03/02/2017  2141     History   Chief Complaint Chief Complaint  Patient presents with  . Fall  . Code STEMI    HPI [name] is a 84 y.o. male.  HPI  84 year old male presents as a code STEMI.  History is obtained from EMS initially and later his wife on when she arrived.  The patient fell tonight off of his bed and was complaining of chest pain.  EMS initially had an EKG that showed atrial fibrillation with RVR.  No known history of A. fib.  He does have a prior cardiac stent.  While going between different hospitals but never stopping in the hospital and rocking him [place] he had a change in his ECG and now has a inferior posterior STEMI.  Then was brought to [facility] and a code STEMI was called.  The patient is currently denying chest pain.  The patient's heart rate has bounced from the 130s down to the 40s and now is in the low 100s.  He has received 1.2 L IV fluid.  Currently his blood pressure is 68/40 per EMS.  Family told the EMS that the patient is more altered than his typical dementia after the fall tonight.  Past Medical History:  Diagnosis Date  . Anemia   . Anxiety   . ASCVD (arteriosclerotic cardiovascular disease)    2 MI's in 1990s  . Cataract   . Chronic renal insufficiency, stage II (mild)    CrCl in the 60s  . Dementia   . Depression   . DJD (degenerative joint disease)   . Esophageal dysphagia    Secondary to Schatzki's ring with dilation x2, most recently in 06/09, negative for H. pylori  . Gallstones   . GERD (gastroesophageal reflux disease)   . HTN (hypertension)    BP meds stopped 2016 due to recurrent orthostatic hypotension  . Hyperlipidemia   . Hypothyroidism   . Mild cognitive impairment with memory loss   . Myocardial infarction [facility]) [id number]   Cardiac cath done but no other intervention that we know of  . Orthostatic hypotension   .  Psoriasis   . Thrombocytopenia ([place])    Platelets 99K on 09/2009 labs  . Ulcerative proctitis ([place])    Dx: 2000, started Asacol at that time.  Was taken off asacol 2014 and has done fine since (Dr. Sydell Axon, [facility] GI ass[facility])    Patient Active Problem List   Diagnosis Date Noted  . CKD (chronic kidney disease) stage 2, GFR 60-89 ml/min 06/18/2016  . Gait instability 06/14/2016  . Aortic valve calcification 04/07/2016  . History of myocardial infarction 04/07/2016  . GERD (gastroesophageal reflux disease) 10/04/2014  . Dementia 04/16/2013  . Hyperlipidemia 04/16/2013  . Esophageal dysphagia 06/23/2012  . Essential hypertension 04/08/2012  . Hypothyroidism 10/15/2009  . Psoriasis 10/15/2009  . PAD (peripheral artery disease) ([place]) 09/11/2009  . Benign esophageal stricture 09/11/2009  . DIVERTICULAR DISEASE 09/11/2009    Past Surgical History:  Procedure Laterality Date  . APPENDECTOMY  1940  . CLEFT LIP REPAIR     As a child  . COLECTOMY  2004   Colonovesicular fistula secondary to diverticulitis requiring sigmoid colectomy and bladder repair  . COLONOSCOPY  06/2007   Dr. Olegario Messier: Few diverticula, normal anastomosiss/p sigmoid colectomy for diverticulitis  . colovesicular fistula  2004  . ERCP N/A 03/07/2016   Procedure: ENDOSCOPIC  RETROGRADE CHOLANGIOPANCREATOGRAPHY (ERCP);  Surgeon: Doran Stabler, MD;  Location: [facility] ENDOSCOPY;  Service: Endoscopy;  Laterality: N/A;  . ERCP N/A 04/21/2016   Procedure: ENDOSCOPIC RETROGRADE CHOLANGIOPANCREATOGRAPHY (ERCP);  Surgeon: Doran Stabler, MD;  Location: [place] [facility];  Service: Gastroenterology;  Laterality: N/A;  . ESOPHAGEAL DILATION  06-23-07; 06/28/12   H pylori NEG.  2014-reactive gastropathy with surface erosion.  . ESOPHAGOGASTRODUODENOSCOPY  06/2007   Dr. Olegario Messier: symptomatic Schatzki ring, s/p 85mm Savary dilator, Erosive duodenitis, s/p bx   . ESOPHAGOGASTRODUODENOSCOPY N/A 04/21/2016   Procedure:  ESOPHAGOGASTRODUODENOSCOPY (EGD);  Surgeon: Doran Stabler, MD;  Location: [place];  Service: Gastroenterology;  Laterality: N/A;  . ESOPHAGOGASTRODUODENOSCOPY (EGD) WITH ESOPHAGEAL DILATION N/A 06/28/2012   WNU:UVOZDGUY cervical web and distal esophageal s/p dilation/Hiatal hernia. Antral erosions (reactive gastropathy, no H.pylori)  . HERNIA REPAIR    . NECK MASS EXCISION  1979       Home Medications    Prior to Admission medications   Medication Sig Start Date End Date Taking? Authorizing Provider  acetaminophen (TYLENOL) 325 MG tablet Take 2 tablets (650 mg total) by mouth every 6 (six) hours as needed for moderate pain. 03/09/16   Doreatha Lew, MD  aspirin 81 MG tablet Take 81 mg by mouth daily.      [provider]  atorvastatin (LIPITOR) 20 MG tablet TAKE ONE-HALF TABLET BY MOUTH ONCE DAILY 07/18/14   McGowen, Adrian Blackwater, MD  azithromycin (ZITHROMAX) 250 MG tablet tad 12/22/16   Raylene Everts, MD  levothyroxine (SYNTHROID, LEVOTHROID) 100 MCG tablet TAKE 1 TABLET BY MOUTH ONCE DAILY BEFORE BREAKFAST 02/01/17   Raylene Everts, MD  nitroGLYCERIN (NITROSTAT) 0.4 MG SL tablet Place 1 tablet (0.4 mg total) under the tongue every 5 (five) minutes as needed for chest pain. 06/26/14   McGowen, Adrian Blackwater, MD  pantoprazole (PROTONIX) 20 MG tablet Take 1 tablet (20 mg total) by mouth daily. 07/20/16   Raylene Everts, MD  polyethylene glycol [facility] Floria Raveling) packet Take 17 g by mouth 2 (two) times daily. 08/24/16   Raylene Everts, MD  risperiDONE (RISPERDAL) 0.5 MG tablet Take 1 tablet (0.5 mg total) by mouth 2 (two) times daily. 11/09/16   Raylene Everts, MD  Triamcinolone Acetonide (TRIAMCINOLONE 0.1 % CREAM : EUCERIN) CREA Apply 1 application topically 2 (two) times daily as needed. 08/25/16   Raylene Everts, MD    Family History Family History  Problem Relation Age of Onset  . Pneumonia Mother   . Depression Mother   . Cancer Father        pt  doesn't remember type  . Heart disease Daughter        heart defect: d age 84    Social History Social History   Tobacco Use  . Smoking status: Former Smoker    Packs/day: 1.00    Years: 10.00    Pack years: 10.00    Types: Cigarettes    Last attempt to quit: 01/11/1970    Years since quitting: 47.1  . Smokeless tobacco: Never Used  . Tobacco comment: Minimal use at 20  Substance Use Topics  . Alcohol use: No  . Drug use: No     Allergies   Patient has no known allergies.   Review of Systems Review of Systems  Unable to perform ROS: Dementia     Physical Exam Updated Vital Signs BP 112/87   Pulse (!) 119   Temp (!)  95.7 F (35.4 C) (Rectal)   Resp (!) 21   Ht 5\' 8"  (1.727 m)   Wt 60.3 kg (133 lb)   SpO2 100%   BMI 20.22 kg/m   Physical Exam  Constitutional: He appears well-developed. No distress.  Frail, chronically ill appearing  HENT:  Head: Normocephalic and atraumatic.  Right Ear: External ear normal.  Left Ear: External ear normal.  Nose: Nose normal.  Eyes: Right eye exhibits no discharge. Left eye exhibits no discharge.  Neck: Neck supple.  Cardiovascular: Normal heart sounds. An irregular rhythm present. Tachycardia present.  Pulses:      Radial pulses are 1+ on the right side, and 1+ on the left side.  Pulmonary/Chest: Effort normal. No accessory muscle usage. Tachypnea noted. He has rales in the right lower field and the left lower field.  Abdominal: Soft. There is no tenderness.  Musculoskeletal: He exhibits no edema.  Neurological: He is alert. He is disoriented.  Skin: Skin is warm and dry.  Nursing note and vitals reviewed.    ED Treatments / Results  Labs (all labs ordered are listed, but only abnormal results are displayed) Labs Reviewed  CBC WITH DIFFERENTIAL/PLATELET - Abnormal; Notable for the following components:      Result Value   RBC 3.47 (*)    Hemoglobin 11.1 (*)    HCT 33.4 (*)    Platelets 108 (*)    All other  components within normal limits  COMPREHENSIVE METABOLIC PANEL - Abnormal; Notable for the following components:   CO2 21 (*)    Glucose, Bld 153 (*)    Calcium 8.4 (*)    Total Protein 6.1 (*)    Albumin 3.3 (*)    GFR calc non Af Amer 54 (*)    All other components within normal limits  LIPID PANEL - Abnormal; Notable for the following components:   HDL 35 (*)    All other components within normal limits  I-STAT CHEM 8, ED - Abnormal; Notable for the following components:   Glucose, Bld 148 (*)    Calcium, Ion 1.09 (*)    Hemoglobin 11.2 (*)    HCT 33.0 (*)    All other components within normal limits  I-STAT CG4 LACTIC ACID, ED - Abnormal; Notable for the following components:   Lactic Acid, Venous 3.61 (*)    All other components within normal limits  CULTURE, BLOOD (ROUTINE X 2)  CULTURE, BLOOD (ROUTINE X 2)  PROTIME-INR  APTT  TROPONIN I  BRAIN NATRIURETIC PEPTIDE  URINALYSIS, ROUTINE W REFLEX MICROSCOPIC  HEPARIN LEVEL (UNFRACTIONATED)  CBC  I-STAT TROPONIN, ED    EKG  EKG Interpretation  Date/Time:  Tuesday March 08 2017 21:48:39 EST Ventricular Rate:  109 PR Interval:    QRS Duration: 117 QT Interval:  358 QTC Calculation: 483 R Axis:   32 Text Interpretation:  Atrial fibrillation Incomplete left bundle branch block Low voltage, precordial leads Posterior infarct, acute (LCx) Anterior Q waves, possibly due to ILBBB ST depression V1-V3, suggest recording posterior leads Artifact in lead(s) I III aVR aVL aVF V1 V2 ST depressions c/w Posterior STEMI Confirmed by Sherwood Gambler [phone]) on 02/13/2017 10:06:54 PM       EKG Interpretation  Date/Time:  Tuesday March 08 2017 21:51:00 EST Ventricular Rate:  120 PR Interval:    QRS Duration: 112 QT Interval:  316 QTC Calculation: 447 R Axis:   40 Text Interpretation:  Atrial fibrillation Incomplete left bundle branch block Low voltage, precordial leads  Posterior infarct, acute (LCx) Anterior Q waves,  possibly due to ILBBB ST depression V1-V3, suggest recording posterior leads no significant change since earlier in the day Confirmed by Sherwood Gambler [phone]) on 03/10/2017 10:48:10 PM        EKG Interpretation  Date/Time:  Tuesday March 08 2017 22:21:43 EST Ventricular Rate:  109 PR Interval:    QRS Duration: 122 QT Interval:  333 QTC Calculation: 449 R Axis:   38 Text Interpretation:  Atrial fibrillation Paired ventricular premature complexes Left bundle branch block ST depression V1-V3, suggest recording posterior leads ST depressions worse compared to earlier in the night Confirmed by Sherwood Gambler [phone]) on 03/01/2017 10:51:52 PM        Radiology Ct Head Wo Contrast  Result Date: 02/17/2017 CLINICAL DATA:  [place] at home EXAM: CT HEAD WITHOUT CONTRAST TECHNIQUE: Contiguous axial images were obtained from the base of the skull through the vertex without intravenous contrast. COMPARISON:  CT 09/21/2015 FINDINGS: Brain: No acute territorial infarction, hemorrhage or intracranial mass is visualized. Moderate severe small vessel ischemic changes of the white matter. Moderate atrophy. Stable ventricle size Vascular: No hyperdense vessels.  Carotid vascular calcification Skull: No depressed skull fracture. Thinning of the left parietal bone Sinuses/Orbits: Mild mucosal thickening in the ethmoid sinuses. No acute orbital abnormality Other: None IMPRESSION: 1. No CT evidence for acute intracranial abnormality. 2. Atrophy and small vessel ischemic changes of the white matter Electronically Signed   By: Donavan Foil M.D.   On: 02/25/2017 22:13   Dg Chest Port 1 View  Result Date: 02/13/2017 CLINICAL DATA:  Fall EXAM: PORTABLE CHEST 1 VIEW COMPARISON:  03/03/2016 FINDINGS: Borderline to mild cardiomegaly with aortic atherosclerosis. Diffuse reticular opacity consistent with fibrosis. Patchy atelectasis or minimal infiltrate at the left greater than right bases. No pleural effusion. Apical  pleural and parenchymal scarring. No pneumothorax. IMPRESSION: 1. Diffuse reticular opacity consistent with fibrosis. Patchy atelectasis or small infiltrates at the left greater than right bases. Negative for pneumothorax 2. Borderline to mild cardiomegaly. Electronically Signed   By: Donavan Foil M.D.   On: 03/06/2017 22:23    Procedures .Critical Care Performed by: Sherwood Gambler, MD Authorized by: Sherwood Gambler, MD   Critical care provider statement:    Critical care time (minutes):  50   Critical care time was exclusive of:  Separately billable procedures and treating other patients   Critical care was necessary to treat or prevent imminent or life-threatening deterioration of the following conditions:  Cardiac failure and circulatory failure   Critical care was time spent personally by me on the following activities:  Development of treatment plan with patient or surrogate, discussions with consultants, evaluation of patient's response to treatment, examination of patient, obtaining history from patient or surrogate, ordering and performing treatments and interventions, ordering and review of laboratory studies, ordering and review of radiographic studies, pulse oximetry, re-evaluation of patient's condition and review of old charts   (including critical care time)  Medications Ordered in ED Medications  0.9 %  sodium chloride infusion (not administered)  heparin ADULT infusion 100 units/mL (25000 units/225mL sodium chloride 0.45%) (700 Units/hr Intravenous New Bag/Given 02/14/2017 2238)  fentaNYL (SUBLIMAZE) 100 MCG/2ML injection (not administered)  sodium chloride 0.9 % bolus 500 mL (not administered)  piperacillin-tazobactam (ZOSYN) IVPB 3.375 g (not administered)  vancomycin (VANCOCIN) IVPB 1000 mg/200 mL premix (not administered)  sodium chloride 0.9 % bolus 500 mL (not administered)  haloperidol lactate (HALDOL) injection 1 mg (not administered)  fentaNYL (SUBLIMAZE) injection  25 mcg (25 mcg Intravenous Given 02/13/2017 2237)  heparin bolus via infusion 3,000 Units (3,000 Units Intravenous Bolus from Bag 02/19/2017 2238)  aspirin suppository 300 mg (300 mg Rectal Given 02/11/2017 2308)  LORazepam (ATIVAN) injection 0.5 mg (0.5 mg Intravenous Given 02/15/2017 2355)     Initial Impression / Assessment and Plan / ED Course  I have reviewed the triage vital signs and the nursing notes.  Pertinent labs & imaging results that were available during my care of the patient were reviewed by me and considered in my medical decision making (see chart for details).     Patient presents as a code STEMI.  ECG consistent with posterior STEMI with depressions in V2 and V3 as well as subtle elevations inferiorly.  Code STEMI canceled by Dr. Gwenlyn Found, would not be a good cath candidate and overall has poor quality of life.  On further discussion with wife, she is not sure she would want catheterization anyway and after further discussion would like for him to be a DNR.  However we will still treat him with aspirin, heparin, and supportive care.  He is noted to be hypothermic and thus sepsis workup also obtained.  His lactic acid is 3.6 so he was given vancomycin and Zosyn for an unknown possible sepsis source.  He has not had any cough or shortness of breath and so I do not think that he has pneumonia.  Urinalysis is still pending.  Head CT obtained prior to blood thinner administration given the fall and change in mental status.  This is unremarkable.  His blood pressures have been soft and he will be given IV fluids.  He is now starting to become agitated, which I think is related to his dementia and being in an unfamiliar location.  He was given a small dose of Ativan.  This did not seem to help too much and so was given a small dose of Haldol.  Discussed with hospitalist, Dr. Myna Hidalgo, who will admit.  Final Clinical Impressions(s) / ED Diagnoses   Final diagnoses:  Acute ST elevation myocardial  infarction (STEMI) of posterior wall [facility])  Atrial fibrillation with RVR [facility])    ED Discharge Orders    None       Sherwood Gambler, MD 03/09/17 [phone]

## 2017-03-08 NOTE — ED Notes (Signed)
bair hugger applied.

## 2017-03-08 NOTE — Progress Notes (Signed)
ANTICOAGULATION CONSULT NOTE - Initial Consult  Pharmacy Consult for heparin Indication: chest pain/ACS  No Known Allergies  Patient Measurements: Height: 5\' 8"  (172.7 cm) Weight: 133 lb (60.3 kg) IBW/kg (Calculated) : 68.4 Heparin Dosing Weight: 60.3 kg   Vital Signs: BP: 112/87 (02/26 2152) Pulse Rate: 114 (02/26 2150)  Labs: No results for input(s): HGB, HCT, PLT, APTT, LABPROT, INR, HEPARINUNFRC, HEPRLOWMOCWT, CREATININE, CKTOTAL, CKMB, TROPONINI in the last 72 hours.  CrCl cannot be calculated (Patient's most recent lab result is older than the maximum 21 days allowed.).   Medical History: Past Medical History:  Diagnosis Date  . Anemia   . Anxiety   . ASCVD (arteriosclerotic cardiovascular disease)    2 MI's in 1990s  . Cataract   . Chronic renal insufficiency, stage II (mild)    CrCl in the 60s  . Dementia   . Depression   . DJD (degenerative joint disease)   . Esophageal dysphagia    Secondary to Schatzki's ring with dilation x2, most recently in 06/09, negative for H. pylori  . Gallstones   . GERD (gastroesophageal reflux disease)   . HTN (hypertension)    BP meds stopped 2016 due to recurrent orthostatic hypotension  . Hyperlipidemia   . Hypothyroidism   . Mild cognitive impairment with memory loss   . Myocardial infarction [facility]) [id number]   Cardiac cath done but no other intervention that we know of  . Orthostatic hypotension   . Psoriasis   . Thrombocytopenia ([place])    Platelets 99K on 09/2009 labs  . Ulcerative proctitis ([place])    Dx: 2000, started Asacol at that time.  Was taken off asacol 2014 and has done fine since (Dr. Sydell Axon, [facility] GI assoc)    Medications:  Scheduled:  . fentaNYL      . aspirin  324 mg Oral Once  . fentaNYL (SUBLIMAZE) injection  25 mcg Intravenous Once  . heparin  3,000 Units Intravenous Once    Assessment: 38 yom came in with concern for ACS s/p fall. Has hx of dementia; however, acting differently post-fall. Was in  atrial fibrillation prior to arrival with EMS and then became bradycardia/hypotensive. CT head negative for bleed. Initial EKG showed STEMI w/ ST elevations; however, repeat EKG showed improvement in ST segments. Initial CBC in process. No signs/symptoms of bleeding on arrival. No prior to admission anticoagulation.    Goal of Therapy:  Heparin level 0.3-0.7 units/ml Monitor platelets by anticoagulation protocol: Yes   Plan:  Give 3000 units bolus x 1 Start heparin infusion at 700 units/hr Check anti-Xa level in 8 hours and daily while on heparin Continue to monitor H&H and platelets  Doylene Canard, PharmD Clinical Pharmacist  Pager: [phone] Phone: [phone] 02/15/2017,10:35 PM

## 2017-03-08 NOTE — ED Notes (Signed)
Pt arrives to ER with [name] for code STEMI; EMS reports called out for patient who fell at home per wife and wife reported that he was not acting the same after; EMS arrived on scene to find patient pale and diaphoretic with HR 120-130 AFIB w/ RVR; pt was initially going to [place] per family wishes, [facility] diverted to [facility] ER. While enroute to [facility] patient began to have spells of bradycardia HR 40-68, EKG obtained showed inferior STEMI; EMS gave bilateral PIV (20 gauge) in ACs and 1.2 L NS bolused enroute to [facility]

## 2017-03-08 NOTE — ED Notes (Signed)
Pt having increased agitation, attempting to get out of bed. Dr. Regenia Skeeter aware.

## 2017-03-09 ENCOUNTER — Telehealth: Payer: Self-pay | Admitting: Internal Medicine

## 2017-03-09 ENCOUNTER — Other Ambulatory Visit: Payer: Self-pay

## 2017-03-09 ENCOUNTER — Encounter (HOSPITAL_COMMUNITY): Payer: Self-pay | Admitting: Family Medicine

## 2017-03-09 DIAGNOSIS — I10 Essential (primary) hypertension: Secondary | ICD-10-CM

## 2017-03-09 DIAGNOSIS — Z9049 Acquired absence of other specified parts of digestive tract: Secondary | ICD-10-CM | POA: Diagnosis not present

## 2017-03-09 DIAGNOSIS — E039 Hypothyroidism, unspecified: Secondary | ICD-10-CM

## 2017-03-09 DIAGNOSIS — Z87891 Personal history of nicotine dependence: Secondary | ICD-10-CM | POA: Diagnosis not present

## 2017-03-09 DIAGNOSIS — J9601 Acute respiratory failure with hypoxia: Secondary | ICD-10-CM | POA: Diagnosis present

## 2017-03-09 DIAGNOSIS — Z7189 Other specified counseling: Secondary | ICD-10-CM | POA: Diagnosis not present

## 2017-03-09 DIAGNOSIS — I2129 ST elevation (STEMI) myocardial infarction involving other sites: Principal | ICD-10-CM

## 2017-03-09 DIAGNOSIS — Z955 Presence of coronary angioplasty implant and graft: Secondary | ICD-10-CM | POA: Diagnosis not present

## 2017-03-09 DIAGNOSIS — E440 Moderate protein-calorie malnutrition: Secondary | ICD-10-CM | POA: Diagnosis present

## 2017-03-09 DIAGNOSIS — I4891 Unspecified atrial fibrillation: Secondary | ICD-10-CM | POA: Insufficient documentation

## 2017-03-09 DIAGNOSIS — N182 Chronic kidney disease, stage 2 (mild): Secondary | ICD-10-CM | POA: Diagnosis not present

## 2017-03-09 DIAGNOSIS — Z8249 Family history of ischemic heart disease and other diseases of the circulatory system: Secondary | ICD-10-CM | POA: Diagnosis not present

## 2017-03-09 DIAGNOSIS — A419 Sepsis, unspecified organism: Secondary | ICD-10-CM | POA: Diagnosis present

## 2017-03-09 DIAGNOSIS — M84451D Pathological fracture, right femur, subsequent encounter for fracture with routine healing: Secondary | ICD-10-CM | POA: Diagnosis not present

## 2017-03-09 DIAGNOSIS — Z515 Encounter for palliative care: Secondary | ICD-10-CM | POA: Diagnosis not present

## 2017-03-09 DIAGNOSIS — D638 Anemia in other chronic diseases classified elsewhere: Secondary | ICD-10-CM | POA: Diagnosis not present

## 2017-03-09 DIAGNOSIS — I2119 ST elevation (STEMI) myocardial infarction involving other coronary artery of inferior wall: Secondary | ICD-10-CM | POA: Diagnosis not present

## 2017-03-09 DIAGNOSIS — F0391 Unspecified dementia with behavioral disturbance: Secondary | ICD-10-CM | POA: Diagnosis not present

## 2017-03-09 DIAGNOSIS — R6510 Systemic inflammatory response syndrome (SIRS) of non-infectious origin without acute organ dysfunction: Secondary | ICD-10-CM | POA: Diagnosis not present

## 2017-03-09 DIAGNOSIS — R079 Chest pain, unspecified: Secondary | ICD-10-CM | POA: Diagnosis not present

## 2017-03-09 DIAGNOSIS — I5041 Acute combined systolic (congestive) and diastolic (congestive) heart failure: Secondary | ICD-10-CM | POA: Diagnosis present

## 2017-03-09 DIAGNOSIS — I2510 Atherosclerotic heart disease of native coronary artery without angina pectoris: Secondary | ICD-10-CM

## 2017-03-09 DIAGNOSIS — I252 Old myocardial infarction: Secondary | ICD-10-CM | POA: Diagnosis not present

## 2017-03-09 DIAGNOSIS — K219 Gastro-esophageal reflux disease without esophagitis: Secondary | ICD-10-CM | POA: Diagnosis present

## 2017-03-09 DIAGNOSIS — E785 Hyperlipidemia, unspecified: Secondary | ICD-10-CM | POA: Diagnosis present

## 2017-03-09 DIAGNOSIS — F419 Anxiety disorder, unspecified: Secondary | ICD-10-CM | POA: Diagnosis present

## 2017-03-09 DIAGNOSIS — D696 Thrombocytopenia, unspecified: Secondary | ICD-10-CM | POA: Diagnosis not present

## 2017-03-09 DIAGNOSIS — I481 Persistent atrial fibrillation: Secondary | ICD-10-CM | POA: Diagnosis not present

## 2017-03-09 DIAGNOSIS — F05 Delirium due to known physiological condition: Secondary | ICD-10-CM | POA: Diagnosis present

## 2017-03-09 DIAGNOSIS — I219 Acute myocardial infarction, unspecified: Secondary | ICD-10-CM | POA: Diagnosis not present

## 2017-03-09 DIAGNOSIS — Z79899 Other long term (current) drug therapy: Secondary | ICD-10-CM | POA: Diagnosis not present

## 2017-03-09 DIAGNOSIS — E038 Other specified hypothyroidism: Secondary | ICD-10-CM | POA: Diagnosis not present

## 2017-03-09 DIAGNOSIS — N183 Chronic kidney disease, stage 3 (moderate): Secondary | ICD-10-CM | POA: Diagnosis present

## 2017-03-09 DIAGNOSIS — L8990 Pressure ulcer of unspecified site, unspecified stage: Secondary | ICD-10-CM

## 2017-03-09 DIAGNOSIS — Z66 Do not resuscitate: Secondary | ICD-10-CM | POA: Diagnosis present

## 2017-03-09 DIAGNOSIS — R296 Repeated falls: Secondary | ICD-10-CM | POA: Diagnosis present

## 2017-03-09 DIAGNOSIS — I340 Nonrheumatic mitral (valve) insufficiency: Secondary | ICD-10-CM | POA: Diagnosis not present

## 2017-03-09 DIAGNOSIS — E870 Hyperosmolality and hypernatremia: Secondary | ICD-10-CM | POA: Diagnosis not present

## 2017-03-09 DIAGNOSIS — I2111 ST elevation (STEMI) myocardial infarction involving right coronary artery: Secondary | ICD-10-CM | POA: Diagnosis not present

## 2017-03-09 DIAGNOSIS — I130 Hypertensive heart and chronic kidney disease with heart failure and stage 1 through stage 4 chronic kidney disease, or unspecified chronic kidney disease: Secondary | ICD-10-CM | POA: Diagnosis present

## 2017-03-09 DIAGNOSIS — F0150 Vascular dementia without behavioral disturbance: Secondary | ICD-10-CM | POA: Diagnosis not present

## 2017-03-09 LAB — TSH: TSH: 0.416 u[IU]/mL (ref 0.350–4.500)

## 2017-03-09 LAB — LACTIC ACID, PLASMA: Lactic Acid, Venous: 5.2 mmol/L (ref 0.5–1.9)

## 2017-03-09 LAB — TROPONIN I
Troponin I: 2.17 ng/mL (ref <0.03)
Troponin I: 13.08 ng/mL (ref <0.03)
Troponin I: 40.84 ng/mL (ref <0.03)

## 2017-03-09 LAB — CBC
HEMATOCRIT: 32.1 % — AB (ref 39.0–52.0)
HEMOGLOBIN: 10.7 g/dL — AB (ref 13.0–17.0)
MCH: 32.3 pg (ref 26.0–34.0)
MCHC: 33.3 g/dL (ref 30.0–36.0)
MCV: 97 fL (ref 78.0–100.0)
Platelets: 114 10*3/uL — ABNORMAL LOW (ref 150–400)
RBC: 3.31 MIL/uL — AB (ref 4.22–5.81)
RDW: 13.7 % (ref 11.5–15.5)
WBC: 9.7 10*3/uL (ref 4.0–10.5)

## 2017-03-09 LAB — HEPARIN LEVEL (UNFRACTIONATED)
Heparin Unfractionated: 0.39 IU/mL (ref 0.30–0.70)
Heparin Unfractionated: 0.36 IU/mL (ref 0.30–0.70)

## 2017-03-09 LAB — PROCALCITONIN: Procalcitonin: 0.26 ng/mL

## 2017-03-09 LAB — MRSA PCR SCREENING: MRSA by PCR: NEGATIVE

## 2017-03-09 MED ORDER — POLYETHYLENE GLYCOL 3350 17 G PO PACK
17.0000 g | PACK | Freq: Two times a day (BID) | ORAL | Status: DC
  Administered 2017-03-09 – 2017-03-15 (×10): 17 g via ORAL
  Filled 2017-03-09 (×11): qty 1

## 2017-03-09 MED ORDER — MORPHINE SULFATE (PF) 4 MG/ML IV SOLN
1.0000 mg | INTRAVENOUS | Status: DC | PRN
Start: 2017-03-09 — End: 2017-03-09

## 2017-03-09 MED ORDER — FUROSEMIDE 10 MG/ML IJ SOLN
80.0000 mg | Freq: Once | INTRAMUSCULAR | Status: DC
  Filled 2017-03-09: qty 8

## 2017-03-09 MED ORDER — ORAL CARE MOUTH RINSE
15.0000 mL | Freq: Two times a day (BID) | OROMUCOSAL | Status: DC
  Administered 2017-03-09 – 2017-03-16 (×13): 15 mL via OROMUCOSAL

## 2017-03-09 MED ORDER — LORAZEPAM 2 MG/ML IJ SOLN
1.0000 mg | Freq: Four times a day (QID) | INTRAMUSCULAR | Status: DC | PRN
  Administered 2017-03-09: 1 mg via INTRAVENOUS
  Filled 2017-03-09: qty 1

## 2017-03-09 MED ORDER — ATORVASTATIN CALCIUM 10 MG PO TABS
10.0000 mg | ORAL_TABLET | Freq: Every day | ORAL | Status: DC
  Administered 2017-03-09 – 2017-03-15 (×5): 10 mg via ORAL
  Filled 2017-03-09 (×7): qty 1

## 2017-03-09 MED ORDER — SODIUM CHLORIDE 0.9 % IV SOLN: INTRAVENOUS | Status: DC

## 2017-03-09 MED ORDER — VANCOMYCIN HCL IN DEXTROSE 1-5 GM/200ML-% IV SOLN: 1000.0000 mg | INTRAVENOUS | Status: DC

## 2017-03-09 MED ORDER — MORPHINE SULFATE (PF) 4 MG/ML IV SOLN
2.0000 mg | INTRAVENOUS | Status: DC | PRN
  Administered 2017-03-15 – 2017-03-16 (×2): 2 mg via INTRAVENOUS
  Filled 2017-03-09 (×2): qty 1

## 2017-03-09 MED ORDER — PIPERACILLIN-TAZOBACTAM 3.375 G IVPB
3.3750 g | Freq: Three times a day (TID) | INTRAVENOUS | Status: DC
  Administered 2017-03-09: 3.375 g via INTRAVENOUS
  Filled 2017-03-09: qty 50

## 2017-03-09 MED ORDER — LORAZEPAM 2 MG/ML IJ SOLN: 0.5000 mg | Freq: Four times a day (QID) | INTRAMUSCULAR | Status: DC | PRN

## 2017-03-09 MED ORDER — ACETAMINOPHEN 325 MG PO TABS: 650.0000 mg | ORAL_TABLET | ORAL | Status: DC | PRN

## 2017-03-09 MED ORDER — LEVOTHYROXINE SODIUM 100 MCG PO TABS
100.0000 ug | ORAL_TABLET | Freq: Every day | ORAL | Status: DC
  Filled 2017-03-09: qty 1

## 2017-03-09 MED ORDER — ACETAMINOPHEN 325 MG PO TABS
650.0000 mg | ORAL_TABLET | ORAL | Status: DC | PRN
  Administered 2017-03-14: 650 mg via ORAL
  Filled 2017-03-09: qty 2

## 2017-03-09 MED ORDER — ASPIRIN 81 MG PO CHEW
81.0000 mg | CHEWABLE_TABLET | Freq: Every day | ORAL | Status: DC
  Administered 2017-03-09 – 2017-03-16 (×8): 81 mg via ORAL
  Filled 2017-03-09 (×8): qty 1

## 2017-03-09 MED ORDER — PANTOPRAZOLE SODIUM 20 MG PO TBEC: 20.0000 mg | DELAYED_RELEASE_TABLET | Freq: Every day | ORAL | Status: DC

## 2017-03-09 MED ORDER — ONDANSETRON HCL 4 MG/2ML IJ SOLN: 4.0000 mg | Freq: Four times a day (QID) | INTRAMUSCULAR | Status: DC | PRN

## 2017-03-09 MED ORDER — HALOPERIDOL LACTATE 5 MG/ML IJ SOLN
1.0000 mg | Freq: Four times a day (QID) | INTRAMUSCULAR | Status: DC | PRN
  Administered 2017-03-09: 1 mg via INTRAVENOUS
  Filled 2017-03-09: qty 1

## 2017-03-09 MED ORDER — HALOPERIDOL LACTATE 5 MG/ML IJ SOLN
2.0000 mg | Freq: Four times a day (QID) | INTRAMUSCULAR | Status: DC | PRN
  Administered 2017-03-09: 1 mg via INTRAVENOUS

## 2017-03-09 MED ORDER — SODIUM CHLORIDE 0.9 % IV BOLUS (SEPSIS): 500.0000 mL | Freq: Once | INTRAVENOUS | Status: DC

## 2017-03-09 MED ORDER — LORAZEPAM 2 MG/ML IJ SOLN
1.0000 mg | INTRAMUSCULAR | Status: DC | PRN
  Administered 2017-03-10 – 2017-03-16 (×6): 1 mg via INTRAVENOUS
  Filled 2017-03-09 (×8): qty 1

## 2017-03-09 MED ORDER — HALOPERIDOL LACTATE 5 MG/ML IJ SOLN
1.0000 mg | Freq: Once | INTRAMUSCULAR | Status: AC
  Administered 2017-03-09: 1 mg via INTRAVENOUS
  Filled 2017-03-09: qty 1

## 2017-03-09 MED ORDER — RISPERIDONE 0.5 MG PO TABS
0.5000 mg | ORAL_TABLET | Freq: Two times a day (BID) | ORAL | Status: DC
  Administered 2017-03-09 – 2017-03-16 (×15): 0.5 mg via ORAL
  Filled 2017-03-09 (×18): qty 1

## 2017-03-09 NOTE — ED Notes (Addendum)
Pt still agitated and pulling off lines. Attempted to redirect. Pt non-compliant. Soft wrist restraints applied.

## 2017-03-09 NOTE — Progress Notes (Addendum)
Progress Note  Patient Name: Raymond [name] Date of Encounter: 03/09/2017  Primary Cardiologist: Jenkins Rouge, MD   Subjective   Patient still altered and unable to answer questions appropriately.   Inpatient Medications    Scheduled Meds: . aspirin  81 mg Oral Daily  . atorvastatin  10 mg Oral q1800  . fentaNYL      . furosemide  80 mg Intravenous Once  . polyethylene glycol  17 g Oral BID  . risperiDONE  0.5 mg Oral BID   Continuous Infusions: . sodium chloride    . heparin 750 Units/hr (03/09/17 0826)  . piperacillin-tazobactam (ZOSYN)  IV 3.375 g (03/09/17 0636)  . sodium chloride    . sodium chloride    . sodium chloride    . vancomycin     PRN Meds: acetaminophen, haloperidol lactate, LORazepam, morphine injection, ondansetron (ZOFRAN) IV   Vital Signs    Vitals:   03/09/17 0700 03/09/17 0716 03/09/17 0746 03/09/17 0850  BP: (!) 85/65 103/67 94/64 (!) 98/58  Pulse: 89 (!) 106 96 99  Resp: (!) 31 (!) 24 (!) 35 (!) 27  Temp:      TempSrc:      SpO2: 97% 97% 100% 100%  Weight:      Height:        Intake/Output Summary (Last 24 hours) at 03/09/2017 1026 Last data filed at 03/09/2017 0205 Gross per 24 hour  Intake 200 ml  Output -  Net 200 ml   Filed Weights   02/12/2017 2141  Weight: 133 lb (60.3 kg)    Telemetry    Atrial fibrillation, rates 100's - Personally Reviewed  ECG    EKG 03/09/17 @ 7am: Afib with rate ~100. Acute posterior MI. Occasional PVC. - Personally Reviewed  Physical Exam   GEN: Chronically-ill and frail elderly male in bed. Delirious.  Neck: No significant JVD Cardiac: Tachycardic and irregular, otherwise normal. No murmur.  Respiratory: Clear to auscultation bilaterally. GI: Soft, nontender, non-distended  MS: Extr warm and perfused. No significant edema; No deformity. Neuro:  Nonfocal, moving all extr.  Psych: Alert but anxious, restless and disoriented. Minimally responds to questions.  Labs    Chemistry Recent  Labs  Lab 02/11/2017 2218 02/26/2017 2238  NA 138 141  K 4.0 4.1  CL 107 105  CO2 21*  --   GLUCOSE 153* 148*  BUN 13 14  CREATININE 1.17 1.10  CALCIUM 8.4*  --   PROT 6.1*  --   ALBUMIN 3.3*  --   AST 39  --   ALT 23  --   ALKPHOS 84  --   BILITOT 0.4  --   GFRNONAA 54*  --   GFRAA >60  --   ANIONGAP 10  --     Hematology Recent Labs  Lab 02/22/2017 2218 02/25/2017 2238 03/09/17 0221  WBC 9.7  --  9.7  RBC 3.47*  --  3.31*  HGB 11.1* 11.2* 10.7*  HCT 33.4* 33.0* 32.1*  MCV 96.3  --  97.0  MCH 32.0  --  32.3  MCHC 33.2  --  33.3  RDW 13.7  --  13.7  PLT 108*  --  114*    Cardiac Enzymes Recent Labs  Lab 02/21/2017 2218 03/09/17 0221 03/09/17 0718  TROPONINI <0.03 2.17* 13.08*    Recent Labs  Lab 03/07/2017 2237  TROPIPOC 0.00     BNP Recent Labs  Lab 02/17/2017 2218  BNP 64.0     DDimer  No results for input(s): DDIMER in the last 168 hours.   Radiology  Ct Head Wo Contrast - Result Date: 03/01/2017 IMPRESSION: 1. No CT evidence for acute intracranial abnormality. 2. Atrophy and small vessel ischemic changes of the white matter  Dg Chest Port 1 View - Result Date: 02/12/2017 IMPRESSION: 1. Diffuse reticular opacity consistent with fibrosis. Patchy atelectasis or small infiltrates at the left greater than right bases. Negative for pneumothorax 2. Borderline to mild cardiomegaly.   Cardiac Studies   Nuclear Stress 04/2016: Large inferior wall infarct from apex to base with no ischemia EF 45% with inferior wall hypokinesis   This is an intermediate risk study.  Findings consistent with prior myocardial infarction.  The left ventricular ejection fraction is mildly decreased (45-54%).  There was no ST segment deviation noted during stress.   Patient Profile     84 y.o. male with advanced dementia, known CAD with hx remote MI, HFrEF by recent NM stress, history of tobacco use, chronic anemia and thrombocytopenia, esophageal dysphagia and history of ulcerate  proctitis who presented 02/27/2017 after developing CP and falling while walking across room. EMS noted he was diaphoretic, pale and shivering despite being in 85* room. He was also found to be in new AFib with RVR and marked ST segment changes concerning for posterior STEMI. On arrival to ED, he was hypotensive, hypoxic, hypothermic to 95.7* and had lactic acid elevation to 3.6. CXR with diffuse reticular opacities c/w fibrosis and small bibasilar infiltrates. CT head negative for acute abnormality. Code sepsis initiated and he was started on IV Vancomycin and Zosyn. Troponin initially negative however trending upwards with most recent values 2.17 > 13. Code STEMI was called on patients arrival to ED but canceled after further discussion with wife who declined cath and would like patient to be DNR. He is currently being medically managed with asa, heparin, opioids prn, Zosyn, Vancomycin, statin as well as other conservative measures.   Assessment & Plan    Posterior STEMI: Troponin was initially negative however elevated overnight (2.1 > 13) and EKG this AM continues to show posterior MI. Family has declined cardiac catheterization given his advanced age and severe dementia, which is of course reasonable. Managing medically with IV Heparin, ASA, opioids prn and oxygen. He is also on statin. Currently holding BB and NTG given hypotension.  -Family declines aggressive management, wish to continue current medical therapy but balanced with pain and anxiety control.  -Medical management with Heparin, asa, statin, oxygen and prn opioids for pain. BB and NTG on hold given hypotension  AFib with RVR: No recorded history of this in chart and new per family. Currently on heparin and rates seem to be improving. Currently 100's, was previously 130's-140's. Rate control limited by soft BP and an episode of bradycardia but seems to be improving with current management. CHADS-VASC is at least 3-4 (age, CAD, HFrEF) however  unsure of appropriateness of chronic anticoagulation in this demented patient presenting following fall.  -Continue heparin -Watch rate, improving with current treatment -Hypotension precluding BB for rate  SIRS, No source of infection so far: Suspect related to AMI. Lactic acid 3.6 > 5.2. Hypothermia improved, most recently 97.3*. He remains tachycardic with soft BP's, most recently 93/71. CXR with diffuse reticular opacities c/w fibrosis. Small bibasilar infiltrates appreciated, L>R. He is not having abdominal pain or excessive BMs to suggest GI source.  -UA still pending, encourage collection -Bcx pending -Abx per primary -Fluids being held/given judiciously given some excess volume  History of HFrEF: NM stress 4/18 with LVEF 45-55%. He does seem to have some volume on exam evidenced by faint bibasilar rales and trace peripheral edema. Received IV Lasix 80mg  x1. No recorded UOP.   Acute Hypoxic Respiratory Failure, Underlying pulmonary fibrosis w/ Hx tobacco use: He is currently requiring 15 L via NRB. Did spend some time on 2L via [place] although desaturated to mid-80's. CXR does show diffuse reticularities c/w fibrosis which has been appreciated on prior studies. No significant wheezing on exam. Patient DNR   Disposition: Grim prognosis, unsure if patient will survive.   For questions or updates, please contact [place] Please consult [email or web address] for contact info under Cardiology/STEMI.   Signed, Bethany Molt, DO  03/09/2017, 10:26 AM    I have examined the patient and reviewed assessment and plan and discussed with patient.  Agree with above as stated.  Currently pain free. No palpitations. He is confused at baseline.  THe family reaffirms the plan of no invasive procedures. Continue supportive care and symptoms management.  Respiratory status Is better than earlier today, but many medical problems portend a poor prognosis.  Raymond [name]

## 2017-03-09 NOTE — Telephone Encounter (Signed)
Routing message 

## 2017-03-09 NOTE — ED Notes (Signed)
Pt placed on non-rebreather d/t SpO2 dropping into the 80s.

## 2017-03-09 NOTE — ED Notes (Signed)
Safety sitter at bedside 

## 2017-03-09 NOTE — Progress Notes (Signed)
[facility] TEAM 1 - Stepdown/ICU TEAM  ROMULO OKRAY  EHU:[id number] DOB: 11/28/1926 DOA: 02/16/2017 PCP: Raylene Everts, MD    Brief Narrative:  84 y.o. male with a history of severe dementia with behavioral disturbance, CAD, hypothyroidism, chronic anemia and thrombocytopenia, and anxiety who presented to the ED w/ chest pain after a fall to the ground.  He was noted to be diaphoretic and pale.  EMS was called and patient was noted to be in atrial fibrillation with RVR with marked ST depression in V1 through V3 concerning for posterior STEMI.  Upon arrival to the ED the patient was found to be hypothermic with temperature 35.4 C, tachycardic in the 120s, with blood pressure of 72/58.  EKG features atrial fibrillation with incomplete LBBB and marked ST depression in V1 through V3.  Chest x-ray is notable for diffuse reticular opacity consistent with fibrosis, and patchy atelectasis or small infiltrates in the bases.  CT head was negative for acute intracranial abnormality.  Lactic acid was elevated to 3.61, and troponin was undetectable.  Cardiology was consulted and the pt was treated with rectal aspirin and a heparin infusion.  The family declined coronary intervention.  Subjective: Pt is seen for a f/u visit.  I spoke w/ his only living son at the bedside, a Pharmacist.  He confirmed NCB status.    Assessment & Plan:  Acute MI Not a candidate for intervention - clinically unstable - do not expect he will survive - cont conservative medical care as per wishes of his son at the bedside (default POA - no other children - wife deceased) - should know more in regard to remaining cardiac function over the next 24-48hrs   Newly diagnoses Afib w/ RVR On heparin gtt -   SIRS  Suspect this is SIRS due to stress of AMI - no clincal findings suggestive of infection - stop volume expansion as pt is developing rest distress and pulmon edema - stop abx and follow clinically    Hypothyroidism  Advanced dementia   Anemia   Thrombocytopenia  Goals of care  Spoke w/ son at bedside - plan is to continue conservative medical tx of his MI balanced w/ pain and anxiety meds to assure he is comfortable - if declines further will discuss transition to full comfort care only   DVT prophylaxis: heparin gtt Code Status: DNR - NO CODE Family Communication: spoke w/ son at bedside  Disposition Plan:   Consultants:  Cardiology   Procedures: none  Antimicrobials:  Zosyn  Vanc   Objective: Blood pressure 94/64, pulse 96, temperature (!) 97.3 F (36.3 C), temperature source Oral, resp. rate (!) 35, height 5\' 8"  (1.727 m), weight 60.3 kg (133 lb), SpO2 100 %.  Intake/Output Summary (Last 24 hours) at 03/09/2017 0839 Last data filed at 03/09/2017 0205 Gross per 24 hour  Intake 200 ml  Output -  Net 200 ml   Filed Weights   02/24/2017 2141  Weight: 60.3 kg (133 lb)    Examination: Pt was seen for a f/u visit.    CBC: Recent Labs  Lab 02/27/2017 2218 02/11/2017 2238 03/09/17 0221  WBC 9.7  --  9.7  NEUTROABS 7.6  --   --   HGB 11.1* 11.2* 10.7*  HCT 33.4* 33.0* 32.1*  MCV 96.3  --  97.0  PLT 108*  --  785*   Basic Metabolic Panel: Recent Labs  Lab 02/16/2017 2218 03/09/2017 2238  NA 138 141  K 4.0 4.1  CL 107 105  CO2 21*  --   GLUCOSE 153* 148*  BUN 13 14  CREATININE 1.17 1.10  CALCIUM 8.4*  --    GFR: Estimated Creatinine Clearance: 39.6 mL/min (by C-G formula based on SCr of 1.1 mg/dL).  Liver Function Tests: Recent Labs  Lab 02/23/2017 2218  AST 39  ALT 23  ALKPHOS 84  BILITOT 0.4  PROT 6.1*  ALBUMIN 3.3*    Coagulation Profile: Recent Labs  Lab 03/04/2017 2218  INR 1.18    Cardiac Enzymes: Recent Labs  Lab 02/17/2017 2218 03/09/17 0221 03/09/17 0718  TROPONINI <0.03 2.17* 13.08*    Scheduled Meds: . aspirin  81 mg Oral Daily  . atorvastatin  10 mg Oral q1800  . fentaNYL      . levothyroxine  100 mcg Oral QAC  breakfast  . pantoprazole  20 mg Oral Daily  . polyethylene glycol  17 g Oral BID  . risperiDONE  0.5 mg Oral BID   Continuous Infusions: . sodium chloride    . heparin 750 Units/hr (03/09/17 0826)  . piperacillin-tazobactam (ZOSYN)  IV 3.375 g (03/09/17 0636)  . sodium chloride    . sodium chloride    . sodium chloride    . vancomycin       LOS: 0 days   Time spent: No Charge  Cherene Altes, MD [facility] Office  [phone] Pager - Text Page per Amion as per below:  On-Call/Text Page:      [email or web address]      password [email or web address]  If 7PM-7AM, please contact night-coverage [email or web address] Password [facility] 03/09/2017, 8:39 AM

## 2017-03-09 NOTE — ED Notes (Addendum)
Pt repositioned to aid in drinking.  Given water at this time before med administration.  Tolerated well.

## 2017-03-09 NOTE — ED Notes (Addendum)
Pt still very agitated. Pt attempting to take all blankets off. Pt yelling "mama" and "take these off".

## 2017-03-09 NOTE — Telephone Encounter (Signed)
Routing to refill pool

## 2017-03-09 NOTE — Telephone Encounter (Signed)
Patient needs pantoprozole prescription  sent to [facility] in [facility]

## 2017-03-09 NOTE — Progress Notes (Signed)
ANTICOAGULATION CONSULT NOTE  Pharmacy Consult:  Heparin Indication: chest pain/ACS  No Known Allergies  Patient Measurements: Height: 5\' 8"  (172.7 cm) Weight: 133 lb (60.3 kg) IBW/kg (Calculated) : 68.4 Heparin Dosing Weight: 60 kg   Vital Signs: Temp: 97.3 F (36.3 C) (02/27 0455) Temp Source: Oral (02/27 0455) BP: 94/64 (02/27 0746) Pulse Rate: 96 (02/27 0746)  Labs: Recent Labs    03/07/2017 2218 02/26/2017 2238 03/09/17 0221 03/09/17 0718  HGB 11.1* 11.2* 10.7*  --   HCT 33.4* 33.0* 32.1*  --   PLT 108*  --  114*  --   APTT 26  --   --   --   LABPROT 14.9  --   --   --   INR 1.18  --   --   --   HEPARINUNFRC  --   --   --  0.39  CREATININE 1.17 1.10  --   --   TROPONINI <0.03  --  2.17*  --     Estimated Creatinine Clearance: 39.6 mL/min (by C-G formula based on SCr of 1.1 mg/dL).   Assessment: 19 YOM presented with concern for ACS s/p fall and was in Afib prior to arrival with EMS and then became bradycardia/hypotensive. CT head negative for bleed. Initial EKG showed STEMI; however, repeat EKG showed improvement in ST segments.  Pharmacy consulted to dose heparin.  Heparin level is therapeutic.  He has a history of thrombocytopenia; no bleeding reported.   Goal of Therapy:  Heparin level 0.3-0.7 units/ml Monitor platelets by anticoagulation protocol: Yes    Plan:  Increase heparin gtt slightly to 750 units/hr Check confirmatory heparin level Daily heparin level and CBC   Leondre Taul D. Mina Marble, PharmD, BCPS Pager:  [phone] 03/09/2017, 8:14 AM

## 2017-03-09 NOTE — H&P (Signed)
History and Physical    LLEWELYN SHEAFFER OIZ:[id number] DOB: 09-Apr-1926 DOA: 02/16/2017  PCP: Raylene Everts, MD   Patient coming from: Home  Chief Complaint: Chest pain dementia with behavioral disturbance, coronary artery disease,  HPI: RADIN RAPTIS is a 84 y.o. male with medical history significant for dementia with behavioral disturbance, coronary artery disease, hypothyroidism, chronic anemia and thrombocytopenia, and anxiety, now presenting to the emergency department for evaluation of chest pain.  Patient had reportedly been in his usual state of health and was having an uneventful day when he was noted by his caregiver to fall to the ground.  He complained of chest pain and was noted to be diaphoretic and pale.  EMS was called and patient was noted to be in atrial fibrillation with RVR on the monitor with marked ST depression in V1 through V3 concerning for posterior STEMI.  ED Course: Upon arrival to the ED, patient is found to be hypothermic with temperature 35.4 C, saturating well on room air, tachycardic in the 120s, and with blood pressure of 72/58.  EKG features atrial fibrillation with incomplete LBBB and marked ST depression in V1 through V3.  Chest x-ray is notable for diffuse reticular opacity consistent with fibrosis, and patchy atelectasis or small infiltrates in the bases.  Noncontrast head CT is negative for acute intracranial abnormality.  Chemistry panel is unremarkable and CBC is notable for a stable normocytic anemia and a chronic thrombocytopenia.  Lactic acid is elevated to 3.61, troponin is undetectable, and BNP is normal.  Blood cultures were collected, 30 cc/kg N bolus given, and empiric vancomycin and Zosyn started.  Cardiology was consulted by the ED physician.  Patient was treated with rectal aspirin and started on heparin infusion.  Per cardiology notes, the family declined intervention and a medical admission was recommended.  Patient remains in atrial fibrillation  with chest pain and no longer diaphoretic.  Blood pressure has improved slightly.  He will be admitted to the stepdown unit for ongoing evaluation and management of acute MI with new atrial fibrillation and SIRS.   Review of Systems:  All other systems reviewed and apart from HPI, are negative.  Past Medical History:  Diagnosis Date  . Anemia   . Anxiety   . ASCVD (arteriosclerotic cardiovascular disease)    2 MI's in 1990s  . Cataract   . Chronic renal insufficiency, stage II (mild)    CrCl in the 60s  . Dementia   . Depression   . DJD (degenerative joint disease)   . Esophageal dysphagia    Secondary to Schatzki's ring with dilation x2, most recently in 06/09, negative for H. pylori  . Gallstones   . GERD (gastroesophageal reflux disease)   . HTN (hypertension)    BP meds stopped 2016 due to recurrent orthostatic hypotension  . Hyperlipidemia   . Hypothyroidism   . Mild cognitive impairment with memory loss   . Myocardial infarction [facility]) [id number]   Cardiac cath done but no other intervention that we know of  . Orthostatic hypotension   . Psoriasis   . Thrombocytopenia ([place])    Platelets 99K on 09/2009 labs  . Ulcerative proctitis ([place])    Dx: 2000, started Asacol at that time.  Was taken off asacol 2014 and has done fine since (Dr. Sydell Axon, [facility])    Past Surgical History:  Procedure Laterality Date  . APPENDECTOMY  1940  . CLEFT LIP REPAIR     As a child  .  COLECTOMY  2004   Colonovesicular fistula secondary to diverticulitis requiring sigmoid colectomy and bladder repair  . COLONOSCOPY  06/2007   Dr. Olegario Messier: Few diverticula, normal anastomosiss/p sigmoid colectomy for diverticulitis  . colovesicular fistula  2004  . ERCP N/A 03/07/2016   Procedure: ENDOSCOPIC RETROGRADE CHOLANGIOPANCREATOGRAPHY (ERCP);  Surgeon: Doran Stabler, MD;  Location: [facility] ENDOSCOPY;  Service: Endoscopy;  Laterality: N/A;  . ERCP N/A 04/21/2016   Procedure: ENDOSCOPIC  RETROGRADE CHOLANGIOPANCREATOGRAPHY (ERCP);  Surgeon: Doran Stabler, MD;  Location: [place];  Service: Gastroenterology;  Laterality: N/A;  . ESOPHAGEAL DILATION  06-23-07; 06/28/12   H pylori NEG.  2014-reactive gastropathy with surface erosion.  . ESOPHAGOGASTRODUODENOSCOPY  06/2007   Dr. Olegario Messier: symptomatic Schatzki ring, s/p 63mm Savary dilator, Erosive duodenitis, s/p bx   . ESOPHAGOGASTRODUODENOSCOPY N/A 04/21/2016   Procedure: ESOPHAGOGASTRODUODENOSCOPY (EGD);  Surgeon: Doran Stabler, MD;  Location: [place];  Service: Gastroenterology;  Laterality: N/A;  . ESOPHAGOGASTRODUODENOSCOPY (EGD) WITH ESOPHAGEAL DILATION N/A 06/28/2012   BMW:UXLKGMWN cervical web and distal esophageal s/p dilation/Hiatal hernia. Antral erosions (reactive gastropathy, no H.pylori)  . HERNIA REPAIR    . NECK MASS EXCISION  1979     reports that he quit smoking about 47 years ago. His smoking use included cigarettes. He has a 10.00 pack-year smoking history. he has never used smokeless tobacco. He reports that he does not drink alcohol or use drugs.  No Known Allergies  Family History  Problem Relation Age of Onset  . Pneumonia Mother   . Depression Mother   . Cancer Father        pt doesn't remember type  . Heart disease Daughter        heart defect: d age 69     Prior to Admission medications   Medication Sig Start Date End Date Taking? Authorizing Provider  acetaminophen (TYLENOL) 325 MG tablet Take 2 tablets (650 mg total) by mouth every 6 (six) hours as needed for moderate pain. 03/09/16   Doreatha Lew, MD  aspirin 81 MG tablet Take 81 mg by mouth daily.      [provider]  atorvastatin (LIPITOR) 20 MG tablet TAKE ONE-HALF TABLET BY MOUTH ONCE DAILY 07/18/14   McGowen, Adrian Blackwater, MD  azithromycin (ZITHROMAX) 250 MG tablet tad 12/22/16   Raylene Everts, MD  levothyroxine (SYNTHROID, LEVOTHROID) 100 MCG tablet TAKE 1 TABLET BY MOUTH ONCE DAILY BEFORE BREAKFAST  02/01/17   Raylene Everts, MD  nitroGLYCERIN (NITROSTAT) 0.4 MG SL tablet Place 1 tablet (0.4 mg total) under the tongue every 5 (five) minutes as needed for chest pain. 06/26/14   McGowen, Adrian Blackwater, MD  pantoprazole (PROTONIX) 20 MG tablet Take 1 tablet (20 mg total) by mouth daily. 07/20/16   Raylene Everts, MD  polyethylene glycol [facility] / Floria Raveling) packet Take 17 g by mouth 2 (two) times daily. 08/24/16   Raylene Everts, MD  risperiDONE (RISPERDAL) 0.5 MG tablet Take 1 tablet (0.5 mg total) by mouth 2 (two) times daily. 11/09/16   Raylene Everts, MD  Triamcinolone Acetonide (TRIAMCINOLONE 0.1 % CREAM : EUCERIN) CREA Apply 1 application topically 2 (two) times daily as needed. 08/25/16   Raylene Everts, MD    Physical Exam: Vitals:   03/07/2017 2313 02/13/2017 2315 02/11/2017 2330 03/09/17 0000  BP:  112/87 92/66 (!) 166/116  Pulse:  (!) 119 (!) 126 (!) 136  Resp:   (!) 25 (!) 45  Temp: Marland Kitchen)  95.7 F (35.4 C)     TempSrc: Rectal     SpO2:   97% 100%  Weight:      Height:          Constitutional: NAD, anxious, restless  Eyes: PERTLA, lids and conjunctivae normal ENMT: Mucous membranes are moist. Posterior pharynx clear of any exudate or lesions.   Neck: normal, supple, no masses, no thyromegaly Respiratory: Breath sounds mildly diminished bilaterally with fine rales bilaterally. No accessory muscle use.  Cardiovascular: Rate ~110 and irregular. No significant JVD. Abdomen: No distension, no tenderness, no masses palpated. Bowel sounds normal.  Musculoskeletal: no clubbing / cyanosis. No joint deformity upper and lower extremities.   Skin: no significant rashes, lesions, ulcers. Warm, dry, well-perfused. Neurologic: No facial asymmetry. Patellar DTR's normal. Moving all extremities.  Psychiatric: Alert. Disoriented. Restless.    Labs on Admission: I have personally reviewed following labs and imaging studies  CBC: Recent Labs  Lab 03/07/2017 2218 02/13/2017 2238  WBC  9.7  --   NEUTROABS 7.6  --   HGB 11.1* 11.2*  HCT 33.4* 33.0*  MCV 96.3  --   PLT 108*  --    Basic Metabolic Panel: Recent Labs  Lab 02/24/2017 2218 02/28/2017 2238  NA 138 141  K 4.0 4.1  CL 107 105  CO2 21*  --   GLUCOSE 153* 148*  BUN 13 14  CREATININE 1.17 1.10  CALCIUM 8.4*  --    GFR: Estimated Creatinine Clearance: 39.6 mL/min (by C-G formula based on SCr of 1.1 mg/dL). Liver Function Tests: Recent Labs  Lab 02/21/2017 2218  AST 39  ALT 23  ALKPHOS 84  BILITOT 0.4  PROT 6.1*  ALBUMIN 3.3*   No results for input(s): LIPASE, AMYLASE in the last 168 hours. No results for input(s): AMMONIA in the last 168 hours. Coagulation Profile: Recent Labs  Lab 03/06/2017 2218  INR 1.18   Cardiac Enzymes: Recent Labs  Lab 02/26/2017 2218  TROPONINI <0.03   BNP (last 3 results) No results for input(s): PROBNP in the last 8760 hours. HbA1C: No results for input(s): HGBA1C in the last 72 hours. CBG: No results for input(s): GLUCAP in the last 168 hours. Lipid Profile: Recent Labs    02/28/2017 2218  CHOL 110  HDL 35*  LDLCALC 48  TRIG 135  CHOLHDL 3.1   Thyroid Function Tests: No results for input(s): TSH, T4TOTAL, FREET4, T3FREE, THYROIDAB in the last 72 hours. Anemia Panel: No results for input(s): VITAMINB12, FOLATE, FERRITIN, TIBC, IRON, RETICCTPCT in the last 72 hours. Urine analysis:    Component Value Date/Time   COLORURINE YELLOW 10/04/2016 [place] 10/04/2016 0952   LABSPEC 1.012 10/04/2016 0952   PHURINE 7.0 10/04/2016 0952   GLUCOSEU NEGATIVE 10/04/2016 0952   HGBUR NEGATIVE 10/04/2016 0952   KETONESUR NEGATIVE 10/04/2016 0952   PROTEINUR NEGATIVE 10/04/2016 0952   NITRITE NEGATIVE 10/04/2016 0952   LEUKOCYTESUR NEGATIVE 10/04/2016 0952   Sepsis Labs: @LABRCNTIP (procalcitonin:4,lacticidven:4) )No results found for this or any previous visit (from the past 240 hour(s)).   Radiological Exams on Admission: Ct Head Wo  Contrast  Result Date: 03/04/2017 CLINICAL DATA:  [place] at home EXAM: CT HEAD WITHOUT CONTRAST TECHNIQUE: Contiguous axial images were obtained from the base of the skull through the vertex without intravenous contrast. COMPARISON:  CT 09/21/2015 FINDINGS: Brain: No acute territorial infarction, hemorrhage or intracranial mass is visualized. Moderate severe small vessel ischemic changes of the white matter. Moderate atrophy. Stable ventricle size  Vascular: No hyperdense vessels.  Carotid vascular calcification Skull: No depressed skull fracture. Thinning of the left parietal bone Sinuses/Orbits: Mild mucosal thickening in the ethmoid sinuses. No acute orbital abnormality Other: None IMPRESSION: 1. No CT evidence for acute intracranial abnormality. 2. Atrophy and small vessel ischemic changes of the white matter Electronically Signed   By: Donavan Foil M.D.   On: 02/27/2017 22:13   Dg Chest Port 1 View  Result Date: 02/18/2017 CLINICAL DATA:  Fall EXAM: PORTABLE CHEST 1 VIEW COMPARISON:  03/03/2016 FINDINGS: Borderline to mild cardiomegaly with aortic atherosclerosis. Diffuse reticular opacity consistent with fibrosis. Patchy atelectasis or minimal infiltrate at the left greater than right bases. No pleural effusion. Apical pleural and parenchymal scarring. No pneumothorax. IMPRESSION: 1. Diffuse reticular opacity consistent with fibrosis. Patchy atelectasis or small infiltrates at the left greater than right bases. Negative for pneumothorax 2. Borderline to mild cardiomegaly. Electronically Signed   By: Donavan Foil M.D.   On: 02/18/2017 22:23    EKG: Independently reviewed. Atrial fibrillation, marked ST-depressions in V1-V3.   Assessment/Plan   1. Acute MI  - Presents with chest pain, diaphoresis, pallor, and EKG concerning for posterior STEMI  - Cardiology is consulting; family declined intervention per notes  - Initial troponin undetectable, CXR with borderline-to-mild CM  - Treated with ASA in  ED and started on heparin infusion  - Beta-blocker and nitrates precluded by hypotension  - Continue cardiac monitoring, trend troponin, continue heparin infusion, continue daily ASA and statin, prn analgesia   2. Atrial fibrillation with RVR  - In atrial fibrillation, appears to be new, with intermittent RVR  - CHADS-VASc is at least 18 (age x2, CAD)  - Continue heparin infusion   3. SIRS  - Hypothermic, tachycardic (in a fib), hypotensive, and with elevated lactate on admission  - CXR with basilar atelectasis versus infiltrates, but no recent cough or dyspnea  - UA still pending  - Abd exam benign, no meningismus  - Blood cultures collected in ED, 30 cc/kg NS given, and empiric vancomycin and Zosyn started  - Follow cultures and clinical course, continue current abx, trend lactate and procalcitonin    4. Hypothyroidism  - Check TSH given new a fib  - Continue Synthroid    5. Dementia  - Continue risperidone for associated behavior disturbance   6. Anemia, thrombocytopenia  - Hgb is 11.1 and platelets 108k on admission  - Both indices stable, no bleeding  - Follow daily CBC while on heparin infusion    DVT prophylaxis: IV heparin infusion   Code Status: DNR  Family Communication: Son updated at bedside  Disposition Plan: Admit to SDU  Consults called: Cardiology  Admission status: Inpatient    Vianne Bulls, MD [facility] Pager [phone]  If 7PM-7AM, please contact night-coverage [email or web address] Password [email or web address]  03/09/2017, 1:01 AM

## 2017-03-09 NOTE — Progress Notes (Signed)
ANTICOAGULATION CONSULT NOTE - Follow-Up  Pharmacy Consult:  Heparin Indication: chest pain/ACS  No Known Allergies  Patient Measurements: Height: 5\' 8"  (172.7 cm) Weight: 133 lb (60.3 kg) IBW/kg (Calculated) : 68.4 Heparin Dosing Weight: 60 kg   Vital Signs: Temp: 99.1 F (37.3 C) (02/27 1532) Temp Source: Oral (02/27 1532) BP: 112/75 (02/27 1600) Pulse Rate: 101 (02/27 1719)  Labs: Recent Labs    02/13/2017 2218 03/05/2017 2238 03/09/17 0221 03/09/17 0718 03/09/17 1300 03/09/17 1634  HGB 11.1* 11.2* 10.7*  --   --   --   HCT 33.4* 33.0* 32.1*  --   --   --   PLT 108*  --  114*  --   --   --   APTT 26  --   --   --   --   --   LABPROT 14.9  --   --   --   --   --   INR 1.18  --   --   --   --   --   HEPARINUNFRC  --   --   --  0.39  --  0.36  CREATININE 1.17 1.10  --   --   --   --   TROPONINI <0.03  --  2.17* 13.08* 40.84*  --     Estimated Creatinine Clearance: 39.6 mL/min (by C-G formula based on SCr of 1.1 mg/dL).   Assessment: 5 YOM presented with concern for ACS s/p fall and was in Afib prior to arrival with EMS and then became bradycardia/hypotensive. CT head negative for bleed. Initial EKG showed STEMI; however, repeat EKG showed improvement in ST segments.  Pharmacy consulted to dose heparin for medical management.  No plans for cardiac cath.  Heparin level remains therapeutic on 750 units/hr.   He has a history of thrombocytopenia; no bleeding reported.   Goal of Therapy:  Heparin level 0.3-0.7 units/ml Monitor platelets by anticoagulation protocol: Yes    Plan:  Continue heparin at 750 units/hr Daily heparin level and CBC   Zakeria Kulzer, Pharm.D., BCPS Clinical Pharmacist 03/09/2017 6:13 PM

## 2017-03-09 NOTE — Progress Notes (Signed)
Pharmacy Antibiotic Note  Raymond [name] is a 84 y.o. male admitted on 02/14/2017 with chest pain.  Pharmacy has been consulted for Vancomycin/Zosyn dosing. Pt hypothermic, tachycardic, and with elevated lactic acid. WBC WNL. CXR does have some small infiltrates.   Plan: Vancomycin 1000 mg IV q24h Zosyn 3.375G IV q8h to be infused over 4 hours Trend WBC, temp, renal function  F/U infectious work-up Drug levels as indicated   Height: 5\' 8"  (172.7 cm) Weight: 133 lb (60.3 kg) IBW/kg (Calculated) : 68.4  Temp (24hrs), Avg:95.7 F (35.4 C), Min:95.7 F (35.4 C), Max:95.7 F (35.4 C)  Recent Labs  Lab 03/10/2017 2218 02/13/2017 2238 02/23/2017 2345  WBC 9.7  --   --   CREATININE 1.17 1.10  --   LATICACIDVEN  --   --  3.61*    Estimated Creatinine Clearance: 39.6 mL/min (by C-G formula based on SCr of 1.1 mg/dL).    No Known Allergies  Narda Bonds 03/09/2017 2:34 AM

## 2017-03-10 ENCOUNTER — Inpatient Hospital Stay (HOSPITAL_COMMUNITY): Payer: Medicare Other

## 2017-03-10 DIAGNOSIS — E038 Other specified hypothyroidism: Secondary | ICD-10-CM

## 2017-03-10 DIAGNOSIS — I340 Nonrheumatic mitral (valve) insufficiency: Secondary | ICD-10-CM

## 2017-03-10 DIAGNOSIS — R6510 Systemic inflammatory response syndrome (SIRS) of non-infectious origin without acute organ dysfunction: Secondary | ICD-10-CM

## 2017-03-10 LAB — ECHOCARDIOGRAM COMPLETE
AOPV: 0.24 m/s
AOVTI: 55.5 cm
AV Area VTI index: 0.8 cm2/m2
AV Area VTI: 1.18 cm2
AV Area mean vel: 1.17 cm2
AV Mean grad: 17 mmHg
AV Peak grad: 34 mmHg
AV VEL mean LVOT/AV: 0.24
AV pk vel: 291 cm/s
AV vel: 1.37
AVAREAMEANVIN: 0.68 cm2/m2
Ao-asc: 37 cm
CHL CUP AV PEAK INDEX: 0.69
CHL CUP AV VALUE AREA INDEX: 0.8
CHL CUP MV DEC (S): 151
DOP CAL AO MEAN VELOCITY: 189 cm/s
EWDT: 151 ms
FS: 11 % — AB (ref 28–44)
HEIGHTINCHES: 68 in
IV/PV OW: 0.91
LA diam end sys: 37 mm
LA vol: 54.9 mL
LADIAMINDEX: 2.15 cm/m2
LASIZE: 37 mm
LAVOLA4C: 44 mL
LAVOLIN: 31.9 mL/m2
LVOT VTI: 15.5 cm
LVOT area: 4.91 cm2
LVOT diameter: 25 mm
LVOTPV: 69.8 cm/s
LVOTSV: 76 mL
LVOTVTI: 0.28 cm
MV Peak grad: 4 mmHg
MV pk A vel: 69.4 m/s
MV pk E vel: 96 m/s
P 1/2 time: 369 ms
PW: 10.9 mm — AB (ref 0.6–1.1)
Reg peak vel: 242 cm/s
TAPSE: 18.4 mm
TR max vel: 242 cm/s
Valve area: 1.37 cm2
WEIGHTICAEL: 2190.49 [oz_av]

## 2017-03-10 LAB — POTASSIUM: Potassium: 4 mmol/L (ref 3.5–5.1)

## 2017-03-10 LAB — CBC
HCT: 33.4 % — ABNORMAL LOW (ref 39.0–52.0)
Hemoglobin: 11.1 g/dL — ABNORMAL LOW (ref 13.0–17.0)
MCH: 31.4 pg (ref 26.0–34.0)
MCHC: 33.2 g/dL (ref 30.0–36.0)
MCV: 94.4 fL (ref 78.0–100.0)
Platelets: 125 10*3/uL — ABNORMAL LOW (ref 150–400)
RBC: 3.54 MIL/uL — ABNORMAL LOW (ref 4.22–5.81)
RDW: 13.6 % (ref 11.5–15.5)
WBC: 10.4 10*3/uL (ref 4.0–10.5)

## 2017-03-10 LAB — BASIC METABOLIC PANEL
Anion gap: 12 (ref 5–15)
BUN: 20 mg/dL (ref 6–20)
CALCIUM: 8.4 mg/dL — AB (ref 8.9–10.3)
CO2: 26 mmol/L (ref 22–32)
CREATININE: 1.32 mg/dL — AB (ref 0.61–1.24)
Chloride: 103 mmol/L (ref 101–111)
GFR calc non Af Amer: 46 mL/min — ABNORMAL LOW (ref >60)
GFR, EST AFRICAN AMERICAN: 54 mL/min — AB (ref >60)
Glucose, Bld: 125 mg/dL — ABNORMAL HIGH (ref 65–99)
Potassium: 3.3 mmol/L — ABNORMAL LOW (ref 3.5–5.1)
Sodium: 141 mmol/L (ref 135–145)

## 2017-03-10 LAB — BLOOD CULTURE ID PANEL (REFLEXED)
Acinetobacter baumannii: NOT DETECTED
CANDIDA GLABRATA: NOT DETECTED
CANDIDA KRUSEI: NOT DETECTED
CANDIDA PARAPSILOSIS: NOT DETECTED
CANDIDA TROPICALIS: NOT DETECTED
Candida albicans: NOT DETECTED
ENTEROBACTER CLOACAE COMPLEX: NOT DETECTED
ENTEROCOCCUS SPECIES: NOT DETECTED
ESCHERICHIA COLI: NOT DETECTED
Enterobacteriaceae species: NOT DETECTED
Haemophilus influenzae: NOT DETECTED
KLEBSIELLA PNEUMONIAE: NOT DETECTED
Klebsiella oxytoca: NOT DETECTED
LISTERIA MONOCYTOGENES: NOT DETECTED
Methicillin resistance: NOT DETECTED
Neisseria meningitidis: NOT DETECTED
PROTEUS SPECIES: NOT DETECTED
Pseudomonas aeruginosa: NOT DETECTED
SERRATIA MARCESCENS: NOT DETECTED
STAPHYLOCOCCUS AUREUS BCID: NOT DETECTED
STAPHYLOCOCCUS SPECIES: DETECTED — AB
STREPTOCOCCUS AGALACTIAE: NOT DETECTED
Streptococcus pneumoniae: NOT DETECTED
Streptococcus pyogenes: NOT DETECTED
Streptococcus species: NOT DETECTED

## 2017-03-10 LAB — TROPONIN I: Troponin I: >65 ng/mL (ref <0.03)

## 2017-03-10 LAB — HEPARIN LEVEL (UNFRACTIONATED): Heparin Unfractionated: 0.39 IU/mL (ref 0.30–0.70)

## 2017-03-10 LAB — MAGNESIUM: MAGNESIUM: 1.7 mg/dL (ref 1.7–2.4)

## 2017-03-10 LAB — LACTIC ACID, PLASMA: LACTIC ACID, VENOUS: 1.8 mmol/L (ref 0.5–1.9)

## 2017-03-10 MED ORDER — LEVOTHYROXINE SODIUM 100 MCG IV SOLR
50.0000 ug | Freq: Every day | INTRAVENOUS | Status: DC
  Administered 2017-03-10 – 2017-03-11 (×2): 50 ug via INTRAVENOUS
  Filled 2017-03-10 (×2): qty 5

## 2017-03-10 MED ORDER — ENSURE ENLIVE PO LIQD
237.0000 mL | Freq: Two times a day (BID) | ORAL | Status: DC
  Administered 2017-03-10 – 2017-03-15 (×7): 237 mL via ORAL

## 2017-03-10 MED ORDER — AMIODARONE HCL IN DEXTROSE 360-4.14 MG/200ML-% IV SOLN
60.0000 mg/h | INTRAVENOUS | Status: DC
Start: 2017-03-10 — End: 2017-03-11
  Administered 2017-03-10 (×2): 60 mg/h via INTRAVENOUS
  Filled 2017-03-10: qty 200

## 2017-03-10 MED ORDER — POTASSIUM CHLORIDE CRYS ER 20 MEQ PO TBCR
40.0000 meq | EXTENDED_RELEASE_TABLET | Freq: Once | ORAL | Status: AC
  Administered 2017-03-10: 40 meq via ORAL
  Filled 2017-03-10: qty 2

## 2017-03-10 MED ORDER — POTASSIUM CHLORIDE CRYS ER 10 MEQ PO TBCR
EXTENDED_RELEASE_TABLET | ORAL | Status: AC
  Administered 2017-03-10: 40 meq via ORAL
  Filled 2017-03-10: qty 4

## 2017-03-10 MED ORDER — PANTOPRAZOLE SODIUM 40 MG PO TBEC: DELAYED_RELEASE_TABLET | ORAL | 3 refills | Status: AC

## 2017-03-10 MED ORDER — AMIODARONE HCL IN DEXTROSE 360-4.14 MG/200ML-% IV SOLN
30.0000 mg/h | INTRAVENOUS | Status: DC
  Administered 2017-03-11: 30 mg/h via INTRAVENOUS
  Filled 2017-03-10 (×2): qty 200

## 2017-03-10 MED ORDER — AMIODARONE LOAD VIA INFUSION
150.0000 mg | Freq: Once | INTRAVENOUS | Status: AC
  Administered 2017-03-10: 150 mg via INTRAVENOUS
  Filled 2017-03-10: qty 83.34

## 2017-03-10 NOTE — Progress Notes (Signed)
ANTICOAGULATION CONSULT NOTE - Follow Up Consult  Pharmacy Consult for Heparin Indication: STEMI  No Known Allergies  Patient Measurements: Height: 5\' 8"  (172.7 cm) Weight: 136 lb 14.5 oz (62.1 kg) IBW/kg (Calculated) : 68.4 Heparin Dosing Weight: 62 kg  Vital Signs: Temp: 97.7 F (36.5 C) (02/28 0806) Temp Source: Axillary (02/28 0806) BP: 82/58 (02/28 0904) Pulse Rate: 102 (02/28 0904)  Labs: Recent Labs    02/17/2017 2218 02/15/2017 2238 03/09/17 0221 03/09/17 0718 03/09/17 1300 03/09/17 1634 03/10/17 0347  HGB 11.1* 11.2* 10.7*  --   --   --  11.1*  HCT 33.4* 33.0* 32.1*  --   --   --  33.4*  PLT 108*  --  114*  --   --   --  125*  APTT 26  --   --   --   --   --   --   LABPROT 14.9  --   --   --   --   --   --   INR 1.18  --   --   --   --   --   --   HEPARINUNFRC  --   --   --  0.39  --  0.36 0.39  CREATININE 1.17 1.10  --   --   --   --  1.32*  TROPONINI <0.03  --  2.17* 13.08* 40.84*  --  >65.00*    Estimated Creatinine Clearance: 34 mL/min (A) (by C-G formula based on SCr of 1.32 mg/dL (H)).  Assessment:   84 yr old male continues on IV heparin for STEMI.  Medical management planned.    Heparin level remains therapeutic (0.39) on 750 units/hr. Thrombocytopenia stable, no bleeding reported.  Goal of Therapy:  Heparin level 0.3-0.7 units/ml Monitor platelets by anticoagulation protocol: Yes   Plan:   Continue heparin drip at 750 units/hr.  Daily heparin level and CBC while on heparin.  Follow up for length of therapy;  [place] 2/26 ~10:30pm.  Arty Baumgartner, [place] Pager: [phone] or [phone] 03/10/2017,12:12 PM

## 2017-03-10 NOTE — Progress Notes (Signed)
Patient was getting restless and agitated last night.  Gave patient 1 mg of Ativan to making him comfortable and relax.  BP are still in the low side SBP running in the 85 to 90s, last one 88/64.  Lung assessment still able to hear some rales and ronchi on all lobes.  Patient has been having runs of NSVT and multifocal PVCs.  I will keep monitoring patient.

## 2017-03-10 NOTE — Progress Notes (Addendum)
Initial Nutrition Assessment  DOCUMENTATION CODES:   Non-severe (moderate) malnutrition in context of chronic illness  INTERVENTION:    Continue Ensure Enlive po BID, each supplement provides 350 kcal and 20 grams of protein  NUTRITION DIAGNOSIS:   Moderate Malnutrition related to chronic illness(dementia) as evidenced by mild fat depletion, moderate fat depletion, mild muscle depletion, moderate muscle depletion.  GOAL:   Patient will meet greater than or equal to 90% of their needs  MONITOR:   PO intake, Supplement acceptance  REASON FOR ASSESSMENT:   Malnutrition Screening Tool    ASSESSMENT:   84 yo male with PMH of ASCVD, hypothyroidism, HLD, anemia, thrombocytopenia, dysphagia, DJD, MI, HTN, dementia with behavioral disturbances, and anxiety who was admitted on 2/26 S/P fall with A fib, posterior STEMI, hypothermia. Plans for conservative medical treatment for MI to ensure comfort.  Patient's family at bedside reports that he usually eats well, but intake has declined within the past few weeks. He drinks 1-2 bottles of Ensure per day at home. Weight stable for the past 7 months. They report that he has a cleft palate and has some difficulty with liquids going into his nose if he is not wearing his dentures. He is currently not wearing dentures. Since admission, patient has only been consuming bites of meals due to decreased alertness.  Labs and medications reviewed.   NUTRITION - FOCUSED PHYSICAL EXAM:    Most Recent Value  Orbital Region  Mild depletion  Upper Arm Region  Moderate depletion  Thoracic and Lumbar Region  Unable to assess  Buccal Region  Mild depletion  Temple Region  Mild depletion  Clavicle Bone Region  Moderate depletion  Clavicle and Acromion Bone Region  Moderate depletion  Scapular Bone Region  Unable to assess  Dorsal Hand  Unable to assess  Patellar Region  Mild depletion  Anterior Thigh Region  Mild depletion  Posterior Calf Region  No  depletion  Edema (RD Assessment)  None  Hair  Reviewed  Eyes  Unable to assess  Mouth  Unable to assess  Skin  Reviewed  Nails  Unable to assess       Diet Order:  Diet regular Room service appropriate? Yes; Fluid consistency: Thin  EDUCATION NEEDS:   No education needs have been identified at this time  Skin:  Skin Assessment: Skin Integrity Issues: Skin Integrity Issues:: Stage II, Stage I Stage I: buttocks Stage II: coccyx  Last BM:  2/27  Height:   Ht Readings from Last 1 Encounters:  02/11/2017 5\' 8"  (1.727 m)    Weight:   Wt Readings from Last 1 Encounters:  03/10/17 136 lb 14.5 oz (62.1 kg)    Ideal Body Weight:  70 kg  BMI:  Body mass index is 20.82 kg/m.  Estimated Nutritional Needs:   Kcal:  1700-1900  Protein:  85-100 gm  Fluid:  1.7-1.9 L    Molli Barrows, RD, LDN, CNSC Pager [phone] After Hours Pager [phone]

## 2017-03-10 NOTE — Progress Notes (Signed)
At approximately 1730 patient's HR began to sustain 150s-170s, afib RVR. Patient then began having very frequent runs of non-sustained Vtach. Runs were longer and closer together than had been noted previously during this shift. Patient was alert to self and calm at this time, BP 89/72. Dr. Sherral Hammers was notified and updated via phone and new orders were received via Epic. IV amiodarone initiated. Ellen Henri PA on call for cardiology was updated and assessed patient. Patient's significant other [name] was updated via phone at this time.

## 2017-03-10 NOTE — Addendum Note (Signed)
Addended by: Gordy Levan, Morell Mears A on: 03/10/2017 03:32 PM   Modules accepted: Orders

## 2017-03-10 NOTE — Telephone Encounter (Signed)
Noted  

## 2017-03-10 NOTE — Progress Notes (Signed)
  Echocardiogram 2D Echocardiogram has been performed.  Raymond [name] 03/10/2017, 5:19 PM

## 2017-03-10 NOTE — Progress Notes (Signed)
PHARMACY - PHYSICIAN COMMUNICATION CRITICAL VALUE ALERT - BLOOD CULTURE IDENTIFICATION (BCID)  Raymond [name] is an 84 y.o. male who presented to [facility] on 03/04/2017 with STEMI  Name of physician (or Provider) Contacted: K Schorr ([facility])  Current antibiotics: None   Changes to prescribed antibiotics recommended:  Likely contaminant, no antibiotics for now  Results for orders placed or performed during the hospital encounter of 02/19/2017  Blood Culture ID Panel (Reflexed) (Collected: 02/21/2017 10:18 PM)  Result Value Ref Range   Enterococcus species NOT DETECTED NOT DETECTED   Listeria monocytogenes NOT DETECTED NOT DETECTED   Staphylococcus species DETECTED (A) NOT DETECTED   Staphylococcus aureus NOT DETECTED NOT DETECTED   Methicillin resistance NOT DETECTED NOT DETECTED   Streptococcus species NOT DETECTED NOT DETECTED   Streptococcus agalactiae NOT DETECTED NOT DETECTED   Streptococcus pneumoniae NOT DETECTED NOT DETECTED   Streptococcus pyogenes NOT DETECTED NOT DETECTED   Acinetobacter baumannii NOT DETECTED NOT DETECTED   Enterobacteriaceae species NOT DETECTED NOT DETECTED   Enterobacter cloacae complex NOT DETECTED NOT DETECTED   Escherichia coli NOT DETECTED NOT DETECTED   Klebsiella oxytoca NOT DETECTED NOT DETECTED   Klebsiella pneumoniae NOT DETECTED NOT DETECTED   Proteus species NOT DETECTED NOT DETECTED   Serratia marcescens NOT DETECTED NOT DETECTED   Haemophilus influenzae NOT DETECTED NOT DETECTED   Neisseria meningitidis NOT DETECTED NOT DETECTED   Pseudomonas aeruginosa NOT DETECTED NOT DETECTED   Candida albicans NOT DETECTED NOT DETECTED   Candida glabrata NOT DETECTED NOT DETECTED   Candida krusei NOT DETECTED NOT DETECTED   Candida parapsilosis NOT DETECTED NOT DETECTED   Candida tropicalis NOT DETECTED NOT DETECTED    Narda Bonds 03/10/2017  1:17 AM

## 2017-03-10 NOTE — Progress Notes (Signed)
PROGRESS NOTE    DILLIAN FEIG  KWI:[id number] DOB: 1926/07/14 DOA: 02/20/2017 PCP: Raylene Everts, MD   Brief Narrative:  84 y.o. WM PMHx   severe dementia with behavioral disturbance, CAD, hypothyroidism, chronic anemia and thrombocytopenia, and anxiety   Presented to the ED w/ chest pain after a fall to the ground.  He was noted to be diaphoretic and pale.  EMS was called and patient was noted to be in atrial fibrillation with RVR with marked ST depression in V1 through V3 concerning for posterior STEMI.   Upon arrival to the ED the patient was found to be hypothermic with temperature 35.4 C, tachycardic in the 120s, with blood pressure of 72/58.  EKG features atrial fibrillation with incomplete LBBB and marked ST depression in V1 through V3.  Chest x-ray is notable for diffuse reticular opacity consistent with fibrosis, and patchy atelectasis or small infiltrates in the bases.  CT head was negative for acute intracranial abnormality.  Lactic acid was elevated to 3.61, and troponin was undetectable.  Cardiology was consulted and the pt was treated with rectal aspirin and a heparin infusion.  The family declined coronary intervention.    Subjective: 2/28 unresponsive.   Assessment & Plan:   Principal Problem:   Acute MI [facility]) Active Problems:   Hypothyroidism   Essential hypertension   Dementia   CKD (chronic kidney disease) stage 2, GFR 60-89 ml/min   ASCVD (arteriosclerotic cardiovascular disease)   Unspecified atrial fibrillation (HCC)   Sepsis, unspecified organism ([place])   Pressure injury of skin  Acute NSTEMI  -Family has chosen conservative management. Per EMR informed cardiology does not want intervention. -Per EMR unlikely patient to survive hospitalization. Medical care as per wishes of his son at the bedside (default POA - no other children - wife deceased) -Echocardiogram pending -Strict in and out -Daily weight -Transfuse for hemoglobin<8   Newly diagnosed  A. fib with RVR -Amiodarone drip -Heparin drip per pharmacy   SIRS  -On admission meets criteria for SIRS , however no clinical indication of infection. -most likely secondary to stress of acute NSTEMI. Antibiotics held    Hypothyroidism -Synthroid IV 50 g   Advanced dementia/AMS -Unable to evaluate secondary to altered mental status    Anemia  -Most likely anemia of chronic disease -Anemia panel pending -Continue to monitor closely   Thrombocytopenia -See anemia -No signs of acute bleed  Hypokalemia  -Potassium goal> 4 -K-Dur 40 mEq -Recheck K/Mg @ 1500   Goals of care  Spoke w/ son at bedside - plan is to continue conservative medical tx of his MI balanced w/ pain and anxiety meds to assure he is comfortable - if declines further will discuss transition to full comfort care only     DVT prophylaxis: Heparin drip Code Status: DO NOT RESUSCITATE Family Communication: Family at bedside for discussion of plan of care Disposition Plan: ??   Consultants:  Cardiology  Procedures/Significant Events:  Echocardiogram pending   I have personally reviewed and interpreted all radiology studies and my findings are as above.  VENTILATOR SETTINGS:    Cultures   Antimicrobials: Anti-infectives (From admission, onward)   Start     Stop   03/09/17 2200  vancomycin (VANCOCIN) IVPB 1000 mg/200 mL premix  Status:  Discontinued     03/09/17 1207   03/09/17 0600  piperacillin-tazobactam (ZOSYN) IVPB 3.375 g  Status:  Discontinued     03/09/17 1207   03/09/17 0000  piperacillin-tazobactam (ZOSYN) IVPB 3.375 g  03/09/17 0210   03/09/17 0000  vancomycin (VANCOCIN) IVPB 1000 mg/200 mL premix     03/09/17 0205       Devices    LINES / TUBES:      Continuous Infusions: . sodium chloride 10 mL/hr at 03/09/17 1311  . heparin 750 Units/hr (03/10/17 0140)     Objective: Vitals:   03/10/17 0415 03/10/17 0507 03/10/17 0730 03/10/17 0806  BP:  (!) 88/64  91/74   Pulse:    (!) 108  Resp:    (!) 25  Temp:    97.7 F (36.5 C)  TempSrc:    Axillary  SpO2: 100%  96% 100%  Weight:      Height:        Intake/Output Summary (Last 24 hours) at 03/10/2017 0855 Last data filed at 03/10/2017 0100 Gross per 24 hour  Intake 361.02 ml  Output 2850 ml  Net -2488.98 ml   Filed Weights   02/13/2017 2141 03/10/17 0357  Weight: 133 lb (60.3 kg) 136 lb 14.5 oz (62.1 kg)    Examination:  General: Unresponsive, No acute respiratory distress, cachectic Neck:  Negative scars, masses, torticollis, lymphadenopathy, JVD Lungs: Clear to auscultation bilaterally without wheezes or crackles Cardiovascular: Irregular irregular rhythm and rate, negative murmur gallop or rub normal S1 and S2 Abdomen: negative abdominal pain, nondistended, positive soft, bowel sounds, no rebound, no ascites, no appreciable mass Extremities: No significant cyanosis, clubbing, or edema bilateral lower extremities Skin: Negative rashes, lesions, ulcers Psychiatric:  Unable to evaluate secondary to altered mental status  Central nervous system:  Unable to evaluate secondary to altered mental status  .     Data Reviewed: Care during the described time interval was provided by me .  I have reviewed this patient's available data, including medical history, events of note, physical examination, and all test results as part of my evaluation.   CBC: Recent Labs  Lab 03/07/2017 2218 03/01/2017 2238 03/09/17 0221 03/10/17 0347  WBC 9.7  --  9.7 10.4  NEUTROABS 7.6  --   --   --   HGB 11.1* 11.2* 10.7* 11.1*  HCT 33.4* 33.0* 32.1* 33.4*  MCV 96.3  --  97.0 94.4  PLT 108*  --  114* 950*   Basic Metabolic Panel: Recent Labs  Lab 02/27/2017 2218 02/14/2017 2238 03/10/17 0347  NA 138 141 141  K 4.0 4.1 3.3*  CL 107 105 103  CO2 21*  --  26  GLUCOSE 153* 148* 125*  BUN 13 14 20   CREATININE 1.17 1.10 1.32*  CALCIUM 8.4*  --  8.4*   GFR: Estimated Creatinine Clearance: 34 mL/min (A)  (by C-G formula based on SCr of 1.32 mg/dL (H)). Liver Function Tests: Recent Labs  Lab 03/03/2017 2218  AST 39  ALT 23  ALKPHOS 84  BILITOT 0.4  PROT 6.1*  ALBUMIN 3.3*   No results for input(s): LIPASE, AMYLASE in the last 168 hours. No results for input(s): AMMONIA in the last 168 hours. Coagulation Profile: Recent Labs  Lab 02/23/2017 2218  INR 1.18   Cardiac Enzymes: Recent Labs  Lab 03/05/2017 2218 03/09/17 0221 03/09/17 0718 03/09/17 1300 03/10/17 0347  TROPONINI <0.03 2.17* 13.08* 40.84* >65.00*   BNP (last 3 results) No results for input(s): PROBNP in the last 8760 hours. HbA1C: No results for input(s): HGBA1C in the last 72 hours. CBG: No results for input(s): GLUCAP in the last 168 hours. Lipid Profile: Recent Labs    02/17/2017 2218  CHOL  110  HDL 35*  LDLCALC 48  TRIG 135  CHOLHDL 3.1   Thyroid Function Tests: Recent Labs    03/09/17 0221  TSH 0.416   Anemia Panel: No results for input(s): VITAMINB12, FOLATE, FERRITIN, TIBC, IRON, RETICCTPCT in the last 72 hours. Urine analysis:    Component Value Date/Time   COLORURINE YELLOW 10/04/2016 [place] 10/04/2016 0952   LABSPEC 1.012 10/04/2016 0952   PHURINE 7.0 10/04/2016 0952   GLUCOSEU NEGATIVE 10/04/2016 0952   HGBUR NEGATIVE 10/04/2016 0952   KETONESUR NEGATIVE 10/04/2016 0952   PROTEINUR NEGATIVE 10/04/2016 0952   NITRITE NEGATIVE 10/04/2016 0952   LEUKOCYTESUR NEGATIVE 10/04/2016 0952   Sepsis Labs: @LABRCNTIP (procalcitonin:4,lacticidven:4)  ) Recent Results (from the past 240 hour(s))  Culture, blood (routine x 2)     Status: None (Preliminary result)   Collection Time: 02/18/2017 10:18 PM  Result Value Ref Range Status   Specimen Description BLOOD LEFT ANTECUBITAL  Final   Special Requests   Final    BOTTLES DRAWN AEROBIC AND ANAEROBIC Blood Culture adequate volume   Culture  Setup Time   Final    GRAM POSITIVE COCCI AEROBIC BOTTLE ONLY Organism ID to  follow CRITICAL RESULT CALLED TO, READ BACK BY AND VERIFIED WITH: Karsten Ro [facility] 03/10/17 0008 JDW Performed at [facility], [place] [address] [place], [place] [address]    Culture PENDING  Incomplete   Report Status PENDING  Incomplete  Blood Culture ID Panel (Reflexed)     Status: Abnormal   Collection Time: 02/23/2017 10:18 PM  Result Value Ref Range Status   Enterococcus species NOT DETECTED NOT DETECTED Final   Listeria monocytogenes NOT DETECTED NOT DETECTED Final   Staphylococcus species DETECTED (A) NOT DETECTED Final    Comment: Methicillin (oxacillin) susceptible coagulase negative staphylococcus. Possible blood culture contaminant (unless isolated from more than one blood culture draw or clinical case suggests pathogenicity). No antibiotic treatment is indicated for blood  culture contaminants. CRITICAL RESULT CALLED TO, READ BACK BY AND VERIFIED WITH: J LEDFORD PHARMD 03/10/17 0008 JDW    Staphylococcus aureus NOT DETECTED NOT DETECTED Final   Methicillin resistance NOT DETECTED NOT DETECTED Final   Streptococcus species NOT DETECTED NOT DETECTED Final   Streptococcus agalactiae NOT DETECTED NOT DETECTED Final   Streptococcus pneumoniae NOT DETECTED NOT DETECTED Final   Streptococcus pyogenes NOT DETECTED NOT DETECTED Final   Acinetobacter baumannii NOT DETECTED NOT DETECTED Final   Enterobacteriaceae species NOT DETECTED NOT DETECTED Final   Enterobacter cloacae complex NOT DETECTED NOT DETECTED Final   Escherichia coli NOT DETECTED NOT DETECTED Final   Klebsiella oxytoca NOT DETECTED NOT DETECTED Final   Klebsiella pneumoniae NOT DETECTED NOT DETECTED Final   Proteus species NOT DETECTED NOT DETECTED Final   Serratia marcescens NOT DETECTED NOT DETECTED Final   Haemophilus influenzae NOT DETECTED NOT DETECTED Final   Neisseria meningitidis NOT DETECTED NOT DETECTED Final   Pseudomonas aeruginosa NOT DETECTED NOT DETECTED Final   Candida albicans NOT DETECTED NOT  DETECTED Final   Candida glabrata NOT DETECTED NOT DETECTED Final   Candida krusei NOT DETECTED NOT DETECTED Final   Candida parapsilosis NOT DETECTED NOT DETECTED Final   Candida tropicalis NOT DETECTED NOT DETECTED Final    Comment: Performed at [facility], [place]. [address], [place], [place] [address]  MRSA PCR Screening     Status: None   Collection Time: 03/09/17  3:45 PM  Result Value Ref Range Status  MRSA by PCR NEGATIVE NEGATIVE Final    Comment:        The GeneXpert MRSA Assay (FDA approved for NASAL specimens only), is one component of a comprehensive MRSA colonization surveillance program. It is not intended to diagnose MRSA infection nor to guide or monitor treatment for MRSA infections. Performed at [facility], [place] [address], [place], [place] [address]          Radiology Studies: Ct Head Wo Contrast  Result Date: 02/15/2017 CLINICAL DATA:  [place] at home EXAM: CT HEAD WITHOUT CONTRAST TECHNIQUE: Contiguous axial images were obtained from the base of the skull through the vertex without intravenous contrast. COMPARISON:  CT 09/21/2015 FINDINGS: Brain: No acute territorial infarction, hemorrhage or intracranial mass is visualized. Moderate severe small vessel ischemic changes of the white matter. Moderate atrophy. Stable ventricle size Vascular: No hyperdense vessels.  Carotid vascular calcification Skull: No depressed skull fracture. Thinning of the left parietal bone Sinuses/Orbits: Mild mucosal thickening in the ethmoid sinuses. No acute orbital abnormality Other: None IMPRESSION: 1. No CT evidence for acute intracranial abnormality. 2. Atrophy and small vessel ischemic changes of the white matter Electronically Signed   By: Donavan Foil M.D.   On: 02/27/2017 22:13   Dg Chest Port 1 View  Result Date: 03/07/2017 CLINICAL DATA:  [place] EXAM: PORTABLE CHEST 1 VIEW COMPARISON:  03/03/2016 FINDINGS: Borderline to mild cardiomegaly with aortic atherosclerosis.  Diffuse reticular opacity consistent with fibrosis. Patchy atelectasis or minimal infiltrate at the left greater than right bases. No pleural effusion. Apical pleural and parenchymal scarring. No pneumothorax. IMPRESSION: 1. Diffuse reticular opacity consistent with fibrosis. Patchy atelectasis or small infiltrates at the left greater than right bases. Negative for pneumothorax 2. Borderline to mild cardiomegaly. Electronically Signed   By: Donavan Foil M.D.   On: 02/12/2017 22:23        Scheduled Meds: . aspirin  81 mg Oral Daily  . atorvastatin  10 mg Oral q1800  . feeding supplement (ENSURE ENLIVE)  237 mL Oral BID BM  . furosemide  80 mg Intravenous Once  . mouth rinse  15 mL Mouth Rinse BID  . polyethylene glycol  17 g Oral BID  . risperiDONE  0.5 mg Oral BID   Continuous Infusions: . sodium chloride 10 mL/hr at 03/09/17 1311  . heparin 750 Units/hr (03/10/17 0140)     LOS: 1 day    Time spent: 40 minutes    Efraim Vanallen, Geraldo Docker, MD [facility] Pager [phone]   If 7PM-7AM, please contact night-coverage [email or web address] Password [email or web address] 03/10/2017, 8:55 AM

## 2017-03-10 NOTE — Telephone Encounter (Signed)
Sent!

## 2017-03-10 NOTE — Progress Notes (Addendum)
Progress Note  Patient Name: Raymond [name] Date of Encounter: 03/10/2017  Primary Cardiologist: Jenkins Rouge, MD   Subjective   No complaints.  He is being fed by a Presenter, broadcasting.  Inpatient Medications    Scheduled Meds: . aspirin  81 mg Oral Daily  . atorvastatin  10 mg Oral q1800  . feeding supplement (ENSURE ENLIVE)  237 mL Oral BID BM  . furosemide  80 mg Intravenous Once  . mouth rinse  15 mL Mouth Rinse BID  . polyethylene glycol  17 g Oral BID  . potassium chloride  40 mEq Oral Once  . risperiDONE  0.5 mg Oral BID   Continuous Infusions: . sodium chloride 10 mL/hr at 03/09/17 1311  . heparin 750 Units/hr (03/10/17 0140)   PRN Meds: acetaminophen, haloperidol lactate, LORazepam, morphine injection, ondansetron (ZOFRAN) IV   Vital Signs    Vitals:   03/10/17 0730 03/10/17 0806 03/10/17 0815 03/10/17 0904  BP:  91/74  (!) 82/58  Pulse:  (!) 108  (!) 102  Resp:  (!) 25  (!) 28  Temp:  97.7 F (36.5 C)    TempSrc:  Axillary    SpO2: 96% 100% 99%   Weight:      Height:        Intake/Output Summary (Last 24 hours) at 03/10/2017 0917 Last data filed at 03/10/2017 0100 Gross per 24 hour  Intake 361.02 ml  Output 2850 ml  Net -2488.98 ml   Filed Weights   02/27/2017 2141 03/10/17 0357  Weight: 133 lb (60.3 kg) 136 lb 14.5 oz (62.1 kg)    Telemetry    Coarse AFib, Rate controlled - Personally Reviewed  ECG    Prominent P waves in some leads but likely coarse AFib - Personally Reviewed  Physical Exam   GEN: No acute distress.   Neck: No JVD Cardiac: RRR, no murmurs, rubs, or gallops.  Respiratory: Clear to auscultation bilaterally. GI: Soft, nontender, non-distended  MS: No edema; No deformity. Neuro:  Nonfocal , not answering questions- likely related to dementia Psych: Normal affect ; unable to answer questions  Labs    Chemistry Recent Labs  Lab 02/25/2017 2218 03/09/2017 2238 03/10/17 0347  NA 138 141 141  K 4.0 4.1 3.3*  CL 107 105  103  CO2 21*  --  26  GLUCOSE 153* 148* 125*  BUN 13 14 20   CREATININE 1.17 1.10 1.32*  CALCIUM 8.4*  --  8.4*  PROT 6.1*  --   --   ALBUMIN 3.3*  --   --   AST 39  --   --   ALT 23  --   --   ALKPHOS 84  --   --   BILITOT 0.4  --   --   GFRNONAA 54*  --  46*  GFRAA >60  --  54*  ANIONGAP 10  --  12     Hematology Recent Labs  Lab 03/09/2017 2218 02/25/2017 2238 03/09/17 0221 03/10/17 0347  WBC 9.7  --  9.7 10.4  RBC 3.47*  --  3.31* 3.54*  HGB 11.1* 11.2* 10.7* 11.1*  HCT 33.4* 33.0* 32.1* 33.4*  MCV 96.3  --  97.0 94.4  MCH 32.0  --  32.3 31.4  MCHC 33.2  --  33.3 33.2  RDW 13.7  --  13.7 13.6  PLT 108*  --  114* 125*    Cardiac Enzymes Recent Labs  Lab 03/09/17 0221 03/09/17 0718 03/09/17 1300 03/10/17  0347  TROPONINI 2.17* 13.08* 40.84* >65.00*    Recent Labs  Lab 02/28/2017 2237  TROPIPOC 0.00     BNP Recent Labs  Lab 02/23/2017 2218  BNP 64.0     DDimer No results for input(s): DDIMER in the last 168 hours.   Radiology    Ct Head Wo Contrast  Result Date: 02/23/2017 CLINICAL DATA:  [place] at home EXAM: CT HEAD WITHOUT CONTRAST TECHNIQUE: Contiguous axial images were obtained from the base of the skull through the vertex without intravenous contrast. COMPARISON:  CT 09/21/2015 FINDINGS: Brain: No acute territorial infarction, hemorrhage or intracranial mass is visualized. Moderate severe small vessel ischemic changes of the white matter. Moderate atrophy. Stable ventricle size Vascular: No hyperdense vessels.  Carotid vascular calcification Skull: No depressed skull fracture. Thinning of the left parietal bone Sinuses/Orbits: Mild mucosal thickening in the ethmoid sinuses. No acute orbital abnormality Other: None IMPRESSION: 1. No CT evidence for acute intracranial abnormality. 2. Atrophy and small vessel ischemic changes of the white matter Electronically Signed   By: Donavan Foil M.D.   On: 03/05/2017 22:13   Dg Chest Port 1 View  Result Date:  03/02/2017 CLINICAL DATA:  [place] EXAM: PORTABLE CHEST 1 VIEW COMPARISON:  03/03/2016 FINDINGS: Borderline to mild cardiomegaly with aortic atherosclerosis. Diffuse reticular opacity consistent with fibrosis. Patchy atelectasis or minimal infiltrate at the left greater than right bases. No pleural effusion. Apical pleural and parenchymal scarring. No pneumothorax. IMPRESSION: 1. Diffuse reticular opacity consistent with fibrosis. Patchy atelectasis or small infiltrates at the left greater than right bases. Negative for pneumothorax 2. Borderline to mild cardiomegaly. Electronically Signed   By: Donavan Foil M.D.   On: 02/19/2017 22:23    Cardiac Studies     Patient Profile     84 y.o. male with ischemia in the setting of dehydration  Assessment & Plan    STEMI: Medically treated significant troponin elevation.  Managing symptoms at this time.  Rate controlled AFib.  No plans for invasive therapy.  Dementia: Stable.  Has mittens on his hands.   CRI: Stage 3.  Stable.  No plans for dye exposure.  Hyperlipidemia: Continue atorvastatin.  For questions or updates, please contact [place] Please consult [email or web address] for contact info under Cardiology/STEMI.      Signed, Larae Grooms, MD  03/10/2017, 9:17 AM

## 2017-03-11 DIAGNOSIS — F0150 Vascular dementia without behavioral disturbance: Secondary | ICD-10-CM

## 2017-03-11 DIAGNOSIS — D638 Anemia in other chronic diseases classified elsewhere: Secondary | ICD-10-CM

## 2017-03-11 DIAGNOSIS — I219 Acute myocardial infarction, unspecified: Secondary | ICD-10-CM

## 2017-03-11 DIAGNOSIS — D696 Thrombocytopenia, unspecified: Secondary | ICD-10-CM

## 2017-03-11 LAB — CBC
HEMATOCRIT: 33.5 % — AB (ref 39.0–52.0)
HEMOGLOBIN: 11 g/dL — AB (ref 13.0–17.0)
MCH: 31.4 pg (ref 26.0–34.0)
MCHC: 32.8 g/dL (ref 30.0–36.0)
MCV: 95.7 fL (ref 78.0–100.0)
Platelets: 109 10*3/uL — ABNORMAL LOW (ref 150–400)
RBC: 3.5 MIL/uL — ABNORMAL LOW (ref 4.22–5.81)
RDW: 14.1 % (ref 11.5–15.5)
WBC: 8.9 10*3/uL (ref 4.0–10.5)

## 2017-03-11 LAB — CULTURE, BLOOD (ROUTINE X 2): Special Requests: ADEQUATE

## 2017-03-11 LAB — IRON AND TIBC
Iron: 9 ug/dL — ABNORMAL LOW (ref 45–182)
SATURATION RATIOS: 4 % — AB (ref 17.9–39.5)
TIBC: 246 ug/dL — ABNORMAL LOW (ref 250–450)
UIBC: 237 ug/dL

## 2017-03-11 LAB — BASIC METABOLIC PANEL
Anion gap: 11 (ref 5–15)
BUN: 24 mg/dL — AB (ref 6–20)
CALCIUM: 9 mg/dL (ref 8.9–10.3)
CHLORIDE: 106 mmol/L (ref 101–111)
CO2: 26 mmol/L (ref 22–32)
CREATININE: 1.14 mg/dL (ref 0.61–1.24)
GFR calc Af Amer: >60 mL/min (ref >60)
GFR calc non Af Amer: 55 mL/min — ABNORMAL LOW (ref >60)
Glucose, Bld: 142 mg/dL — ABNORMAL HIGH (ref 65–99)
Potassium: 4.1 mmol/L (ref 3.5–5.1)
Sodium: 143 mmol/L (ref 135–145)

## 2017-03-11 LAB — TROPONIN I: TROPONIN I: 34.86 ng/mL — AB (ref <0.03)

## 2017-03-11 LAB — RETICULOCYTES
RBC.: 3.5 MIL/uL — AB (ref 4.22–5.81)
RETIC CT PCT: 0.8 % (ref 0.4–3.1)
Retic Count, Absolute: 28 10*3/uL (ref 19.0–186.0)

## 2017-03-11 LAB — HEPARIN LEVEL (UNFRACTIONATED)
HEPARIN UNFRACTIONATED: 0.21 [IU]/mL — AB (ref 0.30–0.70)
Heparin Unfractionated: 0.15 IU/mL — ABNORMAL LOW (ref 0.30–0.70)

## 2017-03-11 LAB — VITAMIN B12: Vitamin B-12: 227 pg/mL (ref 180–914)

## 2017-03-11 LAB — MAGNESIUM: Magnesium: 1.7 mg/dL (ref 1.7–2.4)

## 2017-03-11 LAB — FOLATE: FOLATE: 38.7 ng/mL (ref >5.9)

## 2017-03-11 LAB — FERRITIN: Ferritin: 131 ng/mL (ref 24–336)

## 2017-03-11 MED ORDER — LEVOTHYROXINE SODIUM 100 MCG PO TABS: 100.0000 ug | ORAL_TABLET | Freq: Every day | ORAL | Status: DC

## 2017-03-11 MED ORDER — AMIODARONE HCL IN DEXTROSE 360-4.14 MG/200ML-% IV SOLN
30.0000 mg/h | INTRAVENOUS | Status: DC
  Administered 2017-03-11 – 2017-03-12 (×2): 30 mg/h via INTRAVENOUS
  Filled 2017-03-11 (×3): qty 200

## 2017-03-11 MED ORDER — AMIODARONE HCL 100 MG PO TABS
100.0000 mg | ORAL_TABLET | Freq: Every day | ORAL | Status: DC
  Filled 2017-03-11: qty 1

## 2017-03-11 MED ORDER — LEVOTHYROXINE SODIUM 100 MCG IV SOLR: 50.0000 ug | Freq: Every day | INTRAVENOUS | Status: DC

## 2017-03-11 MED ORDER — AMIODARONE HCL IN DEXTROSE 360-4.14 MG/200ML-% IV SOLN: 60.0000 mg/h | INTRAVENOUS | Status: AC

## 2017-03-11 NOTE — Progress Notes (Signed)
ANTICOAGULATION CONSULT NOTE - Follow Up Consult  Pharmacy Consult for Heparin Indication: STEMI  No Known Allergies  Patient Measurements: Height: 5\' 8"  (172.7 cm) Weight: 139 lb 8.8 oz (63.3 kg) IBW/kg (Calculated) : 68.4 Heparin Dosing Weight: 62 kg  Vital Signs: Temp: 98.2 F (36.8 C) (03/01 0407) Temp Source: Axillary (03/01 0407) BP: 91/79 (03/01 0407) Pulse Rate: 107 (03/01 0407)  Labs: Recent Labs    02/20/2017 2218 03/04/2017 2238 03/09/17 0221  03/09/17 0718 03/09/17 1300 03/09/17 1634 03/10/17 0347 03/11/17 0431  HGB 11.1* 11.2* 10.7*  --   --   --   --  11.1* 11.0*  HCT 33.4* 33.0* 32.1*  --   --   --   --  33.4* 33.5*  PLT 108*  --  114*  --   --   --   --  125* PENDING  APTT 26  --   --   --   --   --   --   --   --   LABPROT 14.9  --   --   --   --   --   --   --   --   INR 1.18  --   --   --   --   --   --   --   --   HEPARINUNFRC  --   --   --    < > 0.39  --  0.36 0.39 0.15*  CREATININE 1.17 1.10  --   --   --   --   --  1.32*  --   TROPONINI <0.03  --  2.17*  --  13.08* 40.84*  --  >65.00*  --    < > = values in this interval not displayed.    Estimated Creatinine Clearance: 34.6 mL/min (A) (by C-G formula based on SCr of 1.32 mg/dL (H)).  Assessment: 84 yr old male continues on IV heparin for ACS.  Medical management planned.  Heparin level low this morning (0.15) on 750 units/hr, had been stable the past 3 checks . Thrombocytopenia stable, pltc this morning is still pending. No bleeding reported, heparin was stopped last night for a few hours d/t lack of access and needing IV amiodarone but was resumed at 2130 so would expect to be back at steady state this morning. Will make small rate change and recheck later today.  Afib with rvr noted last night.  Goal of Therapy:  Heparin level 0.3-0.7 units/ml Monitor platelets by anticoagulation protocol: Yes   Plan:  Increase heparin drip to 850 units/hr. Daily heparin level and CBC while on  heparin. Follow up for length of therapy;  [place] 2/26 ~10:30pm.  Erin Hearing PharmD., BCPS Clinical Pharmacist 03/11/2017 5:41 AM

## 2017-03-11 NOTE — Progress Notes (Signed)
PROGRESS NOTE    Raymond [name]  CWC:[id number] DOB: [birth year] DOA: 03/01/2017 PCP: Raylene Everts, MD   Brief Narrative:  84 y.o. WM PMHx   severe dementia with behavioral disturbance, CAD, hypothyroidism, chronic anemia and thrombocytopenia, and anxiety   Presented to the ED w/ chest pain after a fall to the ground.  He was noted to be diaphoretic and pale.  EMS was called and patient was noted to be in atrial fibrillation with RVR with marked ST depression in V1 through V3 concerning for posterior STEMI.   Upon arrival to the ED the patient was found to be hypothermic with temperature 35.4 C, tachycardic in the 120s, with blood pressure of 72/58.  EKG features atrial fibrillation with incomplete LBBB and marked ST depression in V1 through V3.  Chest x-ray is notable for diffuse reticular opacity consistent with fibrosis, and patchy atelectasis or small infiltrates in the bases.  CT head was negative for acute intracranial abnormality.  Lactic acid was elevated to 3.61, and troponin was undetectable.  Cardiology was consulted and the pt was treated with rectal aspirin and a heparin infusion.  The family declined coronary intervention.    Subjective: 3/1 sleepy difficult to arouse. Negative CP, negative SOB. Per HCPOA patient has frequent falls    Assessment & Plan:   Principal Problem:   Acute MI ([place]) Active Problems:   Hypothyroidism   Essential hypertension   Dementia   CKD (chronic kidney disease) stage 2, GFR 60-89 ml/min   ASCVD (arteriosclerotic cardiovascular disease)   Unspecified atrial fibrillation (HCC)   Sepsis, unspecified organism (HCC)   Pressure injury of skin  Acute STEMI/ Acute Systolic and Diastolic CHF  -Family has chosen conservative management. Per EMR informed cardiology does not want intervention. -Per EMR unlikely patient to survive hospitalization. Medical care as per wishes of his son at the bedside (default POA - no other children - wife  deceased) -Echocardiogram: Consistent with systolic and diastolic CHF see results below.no previous echocardiogram for comparison. -Strict in and out since admission -2.6 L -Daily weight Filed Weights   02/22/2017 2141 03/10/17 0357 03/11/17 0407  Weight: 133 lb (60.3 kg) 136 lb 14.5 oz (62.1 kg) 139 lb 8.8 oz (63.3 kg)  -Transfuse for hemoglobin<8   Newly diagnosed A. fib with RVR -NSR -Continue Amiodarone drip patient unable to take PO  -continue heparin drip per pharmacy. Discuss with family changing to DOAC(but given patient's frequent falls may not be advisable)   SIRS  -On admission meets criteria for SIRS , however no clinical indication of infection. -most likely secondary to stress of acute NSTEMI. Antibiotics held  -no signs or symptoms of infection   Hypothyroidism -Synthroid IV 50 g. Until patient able to take PO medication.   Advanced dementia/AMS -Unable to evaluate secondary to altered mental status    Anemia of chronic disease  -Most likely anemia of chronic disease -Anemia panel: Consistent with anemia of chronic disease. -Continue to monitor closely   Thrombocytopenia -most likely secondary to stress related to NSTEMI  -See anemia -no signs of acute bleed. -Cardiology is titrating heparin with intention to discontinue on 3/2. See note  Hypokalemia  -Potassium goal> 4     Goals of care  -Spoke w/ son at bedside - plan is to continue conservative medical tx of his MI balanced w/ pain and anxiety meds to assure he is comfortable - if declines further will discuss transition to full comfort care only  -If no improvement in next  48 hours would consult palliative care.    DVT prophylaxis: Heparin drip Code Status: DO NOT RESUSCITATE Family Communication: Family at bedside for discussion of plan of care Disposition Plan: TBD   Consultants:  Cardiology   Procedures/Significant Events:  2/28 Echocardiogram; Left ventricle: LVEF= 40% to 45%.-   Akinesis and scarring of the basal-midinferolateral, inferior,  and inferoseptal myocardium; consistent with infarction in the   distribution of the right coronary artery. -Grade 2 diastolic dysfunction. - Aortic valve:moderate regurgitation  - Mitral valve: mild to moderate regurgitation     I have personally reviewed and interpreted all radiology studies and my findings are as above.  VENTILATOR SETTINGS:    Cultures   Antimicrobials: Anti-infectives (From admission, onward)   Start     Stop   03/09/17 2200  vancomycin (VANCOCIN) IVPB 1000 mg/200 mL premix  Status:  Discontinued     03/09/17 1207   03/09/17 0600  piperacillin-tazobactam (ZOSYN) IVPB 3.375 g  Status:  Discontinued     03/09/17 1207   03/09/17 0000  piperacillin-tazobactam (ZOSYN) IVPB 3.375 g     03/09/17 0210   03/09/17 0000  vancomycin (VANCOCIN) IVPB 1000 mg/200 mL premix     03/09/17 0205       Devices    LINES / TUBES:      Continuous Infusions: . sodium chloride 10 mL/hr at 03/09/17 1311  . amiodarone 30 mg/hr (03/11/17 0413)  . heparin 850 Units/hr (03/11/17 1159)     Objective: Vitals:   03/11/17 0003 03/11/17 0407 03/11/17 0750 03/11/17 1154  BP: 103/65 91/79 111/79 128/85  Pulse: 100 (!) 107 89 (!) 102  Resp:  (!) 27 (!) 35 (!) 33  Temp: 98.1 F (36.7 C) 98.2 F (36.8 C) 98.2 F (36.8 C) 98.1 F (36.7 C)  TempSrc: Axillary Axillary Axillary Axillary  SpO2: 100% 94% 95% 90%  Weight:  139 lb 8.8 oz (63.3 kg)    Height:        Intake/Output Summary (Last 24 hours) at 03/11/2017 1425 Last data filed at 03/11/2017 4193 Gross per 24 hour  Intake 112.5 ml  Output 200 ml  Net -87.5 ml   Filed Weights   02/19/2017 2141 03/10/17 0357 03/11/17 0407  Weight: 133 lb (60.3 kg) 136 lb 14.5 oz (62.1 kg) 139 lb 8.8 oz (63.3 kg)    Physical Exam:  General:  minimally responsive, No acute respiratory distress, cachectic Neck:  Negative scars, masses, torticollis, lymphadenopathy,  JVD Lungs: Clear to auscultation bilaterally without wheezes or crackles Cardiovascular:  regular rhythm and rate, negative murmur gallop or rub normal S1 and S2 Abdomen: negative abdominal pain, nondistended, positive soft, bowel sounds, no rebound, no ascites, no appreciable mass Extremities: No significant cyanosis, clubbing, or edema bilateral lower extremities Skin: Negative rashes, lesions, ulcers Psychiatric:  Unable to evaluate secondary to altered mental status  Central nervous system:  Unable to evaluate secondary to altered mental status  .     Data Reviewed: Care during the described time interval was provided by me .  I have reviewed this patient's available data, including medical history, events of note, physical examination, and all test results as part of my evaluation.   CBC: Recent Labs  Lab 02/15/2017 2218 02/22/2017 2238 03/09/17 0221 03/10/17 0347 03/11/17 0431  WBC 9.7  --  9.7 10.4 8.9  NEUTROABS 7.6  --   --   --   --   HGB 11.1* 11.2* 10.7* 11.1* 11.0*  HCT 33.4* 33.0* 32.1* 33.4*  33.5*  MCV 96.3  --  97.0 94.4 95.7  PLT 108*  --  114* 125* 149*   Basic Metabolic Panel: Recent Labs  Lab 02/24/2017 2218 02/28/2017 2238 03/10/17 0347 03/10/17 1356 03/11/17 0431  NA 138 141 141  --  143  K 4.0 4.1 3.3* 4.0 4.1  CL 107 105 103  --  106  CO2 21*  --  26  --  26  GLUCOSE 153* 148* 125*  --  142*  BUN 13 14 20   --  24*  CREATININE 1.17 1.10 1.32*  --  1.14  CALCIUM 8.4*  --  8.4*  --  9.0  MG  --   --   --  1.7 1.7   GFR: Estimated Creatinine Clearance: 40.1 mL/min (by C-G formula based on SCr of 1.14 mg/dL). Liver Function Tests: Recent Labs  Lab 03/01/2017 2218  AST 39  ALT 23  ALKPHOS 84  BILITOT 0.4  PROT 6.1*  ALBUMIN 3.3*   No results for input(s): LIPASE, AMYLASE in the last 168 hours. No results for input(s): AMMONIA in the last 168 hours. Coagulation Profile: Recent Labs  Lab 02/16/2017 2218  INR 1.18   Cardiac Enzymes: Recent Labs   Lab 03/09/17 0221 03/09/17 0718 03/09/17 1300 03/10/17 0347 03/11/17 0431  TROPONINI 2.17* 13.08* 40.84* >65.00* 34.86*   BNP (last 3 results) No results for input(s): PROBNP in the last 8760 hours. HbA1C: No results for input(s): HGBA1C in the last 72 hours. CBG: No results for input(s): GLUCAP in the last 168 hours. Lipid Profile: Recent Labs    02/18/2017 2218  CHOL 110  HDL 35*  LDLCALC 48  TRIG 135  CHOLHDL 3.1   Thyroid Function Tests: Recent Labs    03/09/17 0221  TSH 0.416   Anemia Panel: Recent Labs    03/11/17 0431  VITAMINB12 227  FOLATE 38.7  FERRITIN 131  TIBC 246*  IRON 9*  RETICCTPCT 0.8   Urine analysis:    Component Value Date/Time   COLORURINE YELLOW 10/04/2016 0952   APPEARANCEUR CLEAR 10/04/2016 0952   LABSPEC 1.012 10/04/2016 0952   PHURINE 7.0 10/04/2016 0952   GLUCOSEU NEGATIVE 10/04/2016 0952   HGBUR NEGATIVE 10/04/2016 0952   KETONESUR NEGATIVE 10/04/2016 0952   PROTEINUR NEGATIVE 10/04/2016 0952   NITRITE NEGATIVE 10/04/2016 0952   LEUKOCYTESUR NEGATIVE 10/04/2016 0952   Sepsis Labs: @LABRCNTIP (procalcitonin:4,lacticidven:4)  ) Recent Results (from the past 240 hour(s))  Culture, blood (routine x 2)     Status: Abnormal   Collection Time: 02/26/2017 10:18 PM  Result Value Ref Range Status   Specimen Description BLOOD LEFT ANTECUBITAL  Final   Special Requests   Final    BOTTLES DRAWN AEROBIC AND ANAEROBIC Blood Culture adequate volume   Culture  Setup Time   Final    GRAM POSITIVE COCCI AEROBIC BOTTLE ONLY CRITICAL RESULT CALLED TO, READ BACK BY AND VERIFIED WITH: J LEDFORD PHARMD 03/10/17 0008 JDW    Culture (A)  Final    STAPHYLOCOCCUS SPECIES (COAGULASE NEGATIVE) THE SIGNIFICANCE OF ISOLATING THIS ORGANISM FROM A SINGLE SET OF BLOOD CULTURES WHEN MULTIPLE SETS ARE DRAWN IS UNCERTAIN. PLEASE NOTIFY THE MICROBIOLOGY DEPARTMENT WITHIN ONE WEEK IF SPECIATION AND SENSITIVITIES ARE REQUIRED. Performed at [facility], [place] [address] [place], [place] [address]    Report Status 03/11/2017 FINAL  Final  Blood Culture ID Panel (Reflexed)     Status: Abnormal   Collection Time: 03/03/2017 10:18 PM  Result Value Ref  Range Status   Enterococcus species NOT DETECTED NOT DETECTED Final   Listeria monocytogenes NOT DETECTED NOT DETECTED Final   Staphylococcus species DETECTED (A) NOT DETECTED Final    Comment: Methicillin (oxacillin) susceptible coagulase negative staphylococcus. Possible blood culture contaminant (unless isolated from more than one blood culture draw or clinical case suggests pathogenicity). No antibiotic treatment is indicated for blood  culture contaminants. CRITICAL RESULT CALLED TO, READ BACK BY AND VERIFIED WITH: J LEDFORD PHARMD 03/10/17 0008 JDW    Staphylococcus aureus NOT DETECTED NOT DETECTED Final   Methicillin resistance NOT DETECTED NOT DETECTED Final   Streptococcus species NOT DETECTED NOT DETECTED Final   Streptococcus agalactiae NOT DETECTED NOT DETECTED Final   Streptococcus pneumoniae NOT DETECTED NOT DETECTED Final   Streptococcus pyogenes NOT DETECTED NOT DETECTED Final   Acinetobacter baumannii NOT DETECTED NOT DETECTED Final   Enterobacteriaceae species NOT DETECTED NOT DETECTED Final   Enterobacter cloacae complex NOT DETECTED NOT DETECTED Final   Escherichia coli NOT DETECTED NOT DETECTED Final   Klebsiella oxytoca NOT DETECTED NOT DETECTED Final   Klebsiella pneumoniae NOT DETECTED NOT DETECTED Final   Proteus species NOT DETECTED NOT DETECTED Final   Serratia marcescens NOT DETECTED NOT DETECTED Final   Haemophilus influenzae NOT DETECTED NOT DETECTED Final   Neisseria meningitidis NOT DETECTED NOT DETECTED Final   Pseudomonas aeruginosa NOT DETECTED NOT DETECTED Final   Candida albicans NOT DETECTED NOT DETECTED Final   Candida glabrata NOT DETECTED NOT DETECTED Final   Candida krusei NOT DETECTED NOT DETECTED Final   Candida parapsilosis NOT DETECTED NOT  DETECTED Final   Candida tropicalis NOT DETECTED NOT DETECTED Final    Comment: Performed at [facility], [place]. [address] [place], [place] [address]  Culture, blood (routine x 2)     Status: None (Preliminary result)   Collection Time: 03/02/2017 11:42 PM  Result Value Ref Range Status   Specimen Description BLOOD LEFT HAND  Final   Special Requests IN PEDIATRIC BOTTLE Blood Culture adequate volume  Final   Culture   Final    NO GROWTH 2 DAYS Performed at [facility], [place] [address], [place], [place] [address]    Report Status PENDING  Incomplete  MRSA PCR Screening     Status: None   Collection Time: 03/09/17  3:45 PM  Result Value Ref Range Status   MRSA by PCR NEGATIVE NEGATIVE Final    Comment:        The GeneXpert MRSA Assay (FDA approved for NASAL specimens only), is one component of a comprehensive MRSA colonization surveillance program. It is not intended to diagnose MRSA infection nor to guide or monitor treatment for MRSA infections. Performed at [facility], [place] [address], [place], [place] [address]          Radiology Studies: No results found.      Scheduled Meds: . aspirin  81 mg Oral Daily  . atorvastatin  10 mg Oral q1800  . feeding supplement (ENSURE ENLIVE)  237 mL Oral BID BM  . levothyroxine  50 mcg Intravenous Daily  . mouth rinse  15 mL Mouth Rinse BID  . polyethylene glycol  17 g Oral BID  . risperiDONE  0.5 mg Oral BID   Continuous Infusions: . sodium chloride 10 mL/hr at 03/09/17 1311  . amiodarone 30 mg/hr (03/11/17 0413)  . heparin 850 Units/hr (03/11/17 1159)     LOS: 2 days    Time spent: 40 minutes  Allie Bossier, MD [facility] Pager [phone]   If 7PM-7AM, please contact night-coverage [email or web address] Password [email or web address] 03/11/2017, 2:25 PM

## 2017-03-11 NOTE — Progress Notes (Signed)
Progress Note  Patient Name: Raymond [name] Date of Encounter: 03/11/2017  Primary Cardiologist: Jenkins Rouge, MD   Subjective   No complaints.  Answers no when I ask if he is hungry? ( Breakfast was sitting next to his bed).  Inpatient Medications    Scheduled Meds: . aspirin  81 mg Oral Daily  . atorvastatin  10 mg Oral q1800  . feeding supplement (ENSURE ENLIVE)  237 mL Oral BID BM  . levothyroxine  50 mcg Intravenous Daily  . mouth rinse  15 mL Mouth Rinse BID  . polyethylene glycol  17 g Oral BID  . risperiDONE  0.5 mg Oral BID   Continuous Infusions: . sodium chloride 10 mL/hr at 03/09/17 1311  . amiodarone 30 mg/hr (03/11/17 0413)  . heparin 850 Units/hr (03/11/17 1159)   PRN Meds: acetaminophen, haloperidol lactate, LORazepam, morphine injection, ondansetron (ZOFRAN) IV   Vital Signs    Vitals:   03/11/17 0003 03/11/17 0407 03/11/17 0750 03/11/17 1154  BP: 103/65 91/79 111/79 128/85  Pulse: 100 (!) 107 89 (!) 102  Resp:  (!) 27 (!) 35 (!) 33  Temp: 98.1 F (36.7 C) 98.2 F (36.8 C) 98.2 F (36.8 C) 98.1 F (36.7 C)  TempSrc: Axillary Axillary Axillary Axillary  SpO2: 100% 94% 95% 90%  Weight:  139 lb 8.8 oz (63.3 kg)    Height:        Intake/Output Summary (Last 24 hours) at 03/11/2017 1440 Last data filed at 03/11/2017 0852 Gross per 24 hour  Intake 112.5 ml  Output 200 ml  Net -87.5 ml   Filed Weights   02/22/2017 2141 03/10/17 0357 03/11/17 0407  Weight: 133 lb (60.3 kg) 136 lb 14.5 oz (62.1 kg) 139 lb 8.8 oz (63.3 kg)    Telemetry     none recent- Personally Reviewed  ECG    Sinus tach - Personally Reviewed  Physical Exam   GEN: No acute distress.   Neck: No JVD Cardiac: RRR, no murmurs, rubs, or gallops.  Respiratory: Clear to auscultation bilaterally. GI: Soft, nontender, non-distended  MS: No edema; No deformity. Neuro:  Nonfocal ; confused Psych: Normal affect   Labs    Chemistry Recent Labs  Lab 02/27/2017 2218  03/01/2017 2238 03/10/17 0347 03/10/17 1356 03/11/17 0431  NA 138 141 141  --  143  K 4.0 4.1 3.3* 4.0 4.1  CL 107 105 103  --  106  CO2 21*  --  26  --  26  GLUCOSE 153* 148* 125*  --  142*  BUN 13 14 20   --  24*  CREATININE 1.17 1.10 1.32*  --  1.14  CALCIUM 8.4*  --  8.4*  --  9.0  PROT 6.1*  --   --   --   --   ALBUMIN 3.3*  --   --   --   --   AST 39  --   --   --   --   ALT 23  --   --   --   --   ALKPHOS 84  --   --   --   --   BILITOT 0.4  --   --   --   --   GFRNONAA 54*  --  46*  --  55*  GFRAA >60  --  54*  --  >60  ANIONGAP 10  --  12  --  11     Hematology Recent Labs  Lab 03/09/17  0221 03/10/17 0347 03/11/17 0431  WBC 9.7 10.4 8.9  RBC 3.31* 3.54* 3.50*  3.50*  HGB 10.7* 11.1* 11.0*  HCT 32.1* 33.4* 33.5*  MCV 97.0 94.4 95.7  MCH 32.3 31.4 31.4  MCHC 33.3 33.2 32.8  RDW 13.7 13.6 14.1  PLT 114* 125* 109*    Cardiac Enzymes Recent Labs  Lab 03/09/17 0718 03/09/17 1300 03/10/17 0347 03/11/17 0431  TROPONINI 13.08* 40.84* >65.00* 34.86*    Recent Labs  Lab 03/06/2017 2237  TROPIPOC 0.00     BNP Recent Labs  Lab 02/21/2017 2218  BNP 64.0     DDimer No results for input(s): DDIMER in the last 168 hours.   Radiology    No results found.  Cardiac Studies     Patient Profile     84 y.o. male with acute posterior MI  Assessment & Plan    1) medical management of sx of MI.  No plans for invasive management. He has been on heparin for 48 hours.  WOuld stop heparin tomorrow morning as part of the medical therapy for his STEMI.  He appears comfortable although dementia interferes with our ability to communicate.  Goals of care will be most important.  For questions or updates, please contact [place] Please consult [email or web address] for contact info under Cardiology/STEMI.      Signed, Larae Grooms, MD  03/11/2017, 2:40 PM

## 2017-03-11 NOTE — Progress Notes (Signed)
ANTICOAGULATION CONSULT NOTE - Follow Up Consult  Pharmacy Consult for Heparin Indication: STEMI  No Known Allergies  Patient Measurements: Height: 5\' 8"  (172.7 cm) Weight: 139 lb 8.8 oz (63.3 kg) IBW/kg (Calculated) : 68.4 Heparin Dosing Weight: 63 kg  Vital Signs: Temp: 98.1 F (36.7 C) (03/01 1154) Temp Source: Axillary (03/01 1154) BP: 128/85 (03/01 1154) Pulse Rate: 102 (03/01 1154)  Labs: Recent Labs    03/01/2017 2218 03/02/2017 2238 03/09/17 0221  03/09/17 1300  03/10/17 0347 03/11/17 0431 03/11/17 1443  HGB 11.1* 11.2* 10.7*  --   --   --  11.1* 11.0*  --   HCT 33.4* 33.0* 32.1*  --   --   --  33.4* 33.5*  --   PLT 108*  --  114*  --   --   --  125* 109*  --   APTT 26  --   --   --   --   --   --   --   --   LABPROT 14.9  --   --   --   --   --   --   --   --   INR 1.18  --   --   --   --   --   --   --   --   HEPARINUNFRC  --   --   --    < >  --    < > 0.39 0.15* 0.21*  CREATININE 1.17 1.10  --   --   --   --  1.32* 1.14  --   TROPONINI <0.03  --  2.17*   < > 40.84*  --  >65.00* 34.86*  --    < > = values in this interval not displayed.    Estimated Creatinine Clearance: 40.1 mL/min (by C-G formula based on SCr of 1.14 mg/dL).  Assessment:   84 yr old male continues on IV heparin for STEMI.  Medical management planned.    Heparin level remains  subtherapeutic (0.21) on 850 units/hr, after increase rate early this am. Thrombocytopenia stable, no bleeding reported.  Noted plan to stop IV heparin on 3/2  Goal of Therapy:  Heparin level 0.3-0.7 units/ml Monitor platelets by anticoagulation protocol: Yes   Plan:   Increase heparin drip to 1000 units/hr.  Next heparin level and CBC in am.  Expecting heparin drip to stop on 3/2 per Cardiology plan.  Arty Baumgartner, [place] Pager: [phone] or [phone] 03/11/2017,3:49 PM

## 2017-03-11 DEATH — deceased

## 2017-03-12 DIAGNOSIS — E440 Moderate protein-calorie malnutrition: Secondary | ICD-10-CM

## 2017-03-12 LAB — HEPARIN LEVEL (UNFRACTIONATED): HEPARIN UNFRACTIONATED: 0.25 [IU]/mL — AB (ref 0.30–0.70)

## 2017-03-12 LAB — BASIC METABOLIC PANEL
Anion gap: 11 (ref 5–15)
BUN: 21 mg/dL — ABNORMAL HIGH (ref 6–20)
CHLORIDE: 106 mmol/L (ref 101–111)
CO2: 25 mmol/L (ref 22–32)
Calcium: 8.7 mg/dL — ABNORMAL LOW (ref 8.9–10.3)
Creatinine, Ser: 1.04 mg/dL (ref 0.61–1.24)
Glucose, Bld: 149 mg/dL — ABNORMAL HIGH (ref 65–99)
POTASSIUM: 3.7 mmol/L (ref 3.5–5.1)
SODIUM: 142 mmol/L (ref 135–145)

## 2017-03-12 LAB — MAGNESIUM: MAGNESIUM: 1.8 mg/dL (ref 1.7–2.4)

## 2017-03-12 LAB — CBC
HCT: 33.4 % — ABNORMAL LOW (ref 39.0–52.0)
HEMOGLOBIN: 10.9 g/dL — AB (ref 13.0–17.0)
MCH: 30.8 pg (ref 26.0–34.0)
MCHC: 32.6 g/dL (ref 30.0–36.0)
MCV: 94.4 fL (ref 78.0–100.0)
Platelets: 113 10*3/uL — ABNORMAL LOW (ref 150–400)
RBC: 3.54 MIL/uL — AB (ref 4.22–5.81)
RDW: 13.9 % (ref 11.5–15.5)
WBC: 8.4 10*3/uL (ref 4.0–10.5)

## 2017-03-12 LAB — TROPONIN I: TROPONIN I: 21.86 ng/mL — AB (ref <0.03)

## 2017-03-12 MED ORDER — METOPROLOL TARTRATE 25 MG PO TABS
25.0000 mg | ORAL_TABLET | Freq: Two times a day (BID) | ORAL | Status: DC
  Administered 2017-03-12 – 2017-03-13 (×3): 25 mg via ORAL
  Filled 2017-03-12 (×3): qty 1

## 2017-03-12 MED ORDER — ENOXAPARIN SODIUM 40 MG/0.4ML ~~LOC~~ SOLN
40.0000 mg | SUBCUTANEOUS | Status: DC
  Administered 2017-03-12 – 2017-03-15 (×4): 40 mg via SUBCUTANEOUS
  Filled 2017-03-12 (×4): qty 0.4

## 2017-03-12 MED ORDER — ENOXAPARIN SODIUM 60 MG/0.6ML ~~LOC~~ SOLN
60.0000 mg | Freq: Two times a day (BID) | SUBCUTANEOUS | Status: DC
Start: 2017-03-12 — End: 2017-03-12
  Filled 2017-03-12: qty 0.6

## 2017-03-12 MED ORDER — LEVOTHYROXINE SODIUM 100 MCG PO TABS
100.0000 ug | ORAL_TABLET | Freq: Every day | ORAL | Status: DC
  Administered 2017-03-13 – 2017-03-16 (×4): 100 ug via ORAL
  Filled 2017-03-12 (×5): qty 1

## 2017-03-12 MED ORDER — AMIODARONE HCL 200 MG PO TABS
200.0000 mg | ORAL_TABLET | Freq: Two times a day (BID) | ORAL | Status: DC
  Administered 2017-03-12 – 2017-03-16 (×9): 200 mg via ORAL
  Filled 2017-03-12 (×9): qty 1

## 2017-03-12 NOTE — Progress Notes (Signed)
Cortrak Tube Team Note:  Consult received to place a Cortrak feeding tube.   Per chart, patient on diet, but with severe dementia and has been too lethargic to take almost anything by mouth and cortrak ordered. It appears palliative/comfort care is being considered  Spoke with patients RN who also took care of patient yesterday. He states the wife was in agreement with placement of short term NGT yesterday, hence c/s was placed, however, today the wife told RN she thinks "we can hold off for now" in regards to tube placement.   Will discontinue consult. If goals of care change and short term NGT desired, please re-consult cortrak team. Note team is only available M, W, F and Saturday. Pager for team member working that day can be found at [email or web address] (password [email or web address]) for replacement.   If after hours and replacement cannot be delayed, place a NG tube and confirm placement with an abdominal x-ray.   Burtis Junes RD, LDN, CNSC Clinical Nutrition Pager: [phone] 03/12/2017 1:58 PM

## 2017-03-12 NOTE — Progress Notes (Signed)
[place] TEAM 1 - Stepdown/ICU TEAM  Raymond [name]  OZH:[id number] DOB: 04-Apr-1926 DOA: 02/21/2017 PCP: Raylene Everts, MD    Brief Narrative:  84 y.o. male with a history of severe dementia with behavioral disturbance, CAD, hypothyroidism, chronic anemia and thrombocytopenia, and anxiety who presented to the ED w/ chest pain after a fall to the ground.  He was noted to be diaphoretic and pale.  EMS was called and patient was noted to be in atrial fibrillation with RVR with marked ST depression in V1 through V3 concerning for posterior STEMI.  Upon arrival to the ED the patient was found to be hypothermic with temperature 35.4 C, tachycardic in the 120s, with blood pressure of 72/58.  EKG features atrial fibrillation with incomplete LBBB and marked ST depression in V1 through V3.  Chest x-ray is notable for diffuse reticular opacity consistent with fibrosis, and patchy atelectasis or small infiltrates in the bases.  CT head was negative for acute intracranial abnormality.  Lactic acid was elevated to 3.61, and troponin was undetectable.  Cardiology was consulted and the pt was treated with rectal aspirin and a heparin infusion.  The family declined coronary intervention.  Subjective: [place] th/o the time of my visit.  Spoke w/ his significant other/POA at the bedside.  No evidence of resp distress or uncontrolled pain at the time of my visit.    Assessment & Plan:  Acute MI Not a candidate for intervention - Cards has been following - EF 40-45% via TTE w/ akinesis of the basal-mid-inferolateral, inferior, and infero-septal myocardium c/w RCA MI - appears to be stabilizing from a medical standpoint  Ischemic Systolic CHF (EF 62-95%) and Grade 2 Diastolic CHF  No signif volume overload in setting of extremely limited intake   Newly diagnosed Afib w/ RVR Rate controlled at this time - is not a candidate for long term anticoag given high fall risk and severe cognitive decline - change  lovenox to DVT prophy dose only   SIRS  SIRS due to stress of AMI - no clincal findings suggestive of infection   Hypothyroidism Cont home synthroid dose  Advanced dementia > terminal dementia Signif other relates a course of signif functional decline over last year, to the point that pt was fully dependent on others for care - we discussed the fact that his acute illness will likely accelerate this cognitive decline, and that he will not likely recover even to his level of fxn prior to his MI - I have advised her that he will very likely not be able to be cared for at home - we have also agreed that a feeding tube in this situation is not appropriate, and that we will accept whatever nutrition he is able to consume voluntarily   Anemia   Thrombocytopenia  Goals of care  Spoke w/ his POA (who is not his son) at bedside - discussed focusing on controlling his anxiety and discomfort, and the need to have him placed in a facility where 24hr support will be available - unclear to me at this time if SNF or a residential hospice house would be most appropriate  DVT prophylaxis: lovenox  Code Status: DNR - NO CODE Family Communication: spoke w/ POA/significant other at bedside  Disposition Plan: pending   Consultants:  Cardiology   Procedures: none  Antimicrobials:  none   Objective: Blood pressure 138/84, pulse (!) 141, temperature (!) 97.5 F (36.4 C), temperature source Axillary, resp. rate (!) 42, height 5\' 8"  (  1.727 m), weight 61.1 kg (134 lb 11.2 oz), SpO2 (!) 86 %.  Intake/Output Summary (Last 24 hours) at 03/12/2017 1459 Last data filed at 03/12/2017 0600 Gross per 24 hour  Intake 843 ml  Output 300 ml  Net 543 ml   Filed Weights   03/10/17 0357 03/11/17 0407 03/12/17 0434  Weight: 62.1 kg (136 lb 14.5 oz) 63.3 kg (139 lb 8.8 oz) 61.1 kg (134 lb 11.2 oz)    Examination: General: No acute respiratory distress Lungs: Clear to auscultation bilaterally without wheezes or  crackles Cardiovascular: tachycardic - regular - no M  Abdomen: Nontender, nondistended, soft, bowel sounds positive, no rebound, no ascites, no appreciable mass Extremities: No significant cyanosis, clubbing, or edema bilateral lower extremities   CBC: Recent Labs  Lab 03/05/2017 2218 03/07/2017 2238 03/09/17 0221 03/10/17 0347 03/11/17 0431 03/12/17 0300  WBC 9.7  --  9.7 10.4 8.9 8.4  NEUTROABS 7.6  --   --   --   --   --   HGB 11.1* 11.2* 10.7* 11.1* 11.0* 10.9*  HCT 33.4* 33.0* 32.1* 33.4* 33.5* 33.4*  MCV 96.3  --  97.0 94.4 95.7 94.4  PLT 108*  --  114* 125* 109* 741*   Basic Metabolic Panel: Recent Labs  Lab 02/23/2017 2218 02/18/2017 2238 03/10/17 0347 03/10/17 1356 03/11/17 0431 03/12/17 0300  NA 138 141 141  --  143 142  K 4.0 4.1 3.3* 4.0 4.1 3.7  CL 107 105 103  --  106 106  CO2 21*  --  26  --  26 25  GLUCOSE 153* 148* 125*  --  142* 149*  BUN 13 14 20   --  24* 21*  CREATININE 1.17 1.10 1.32*  --  1.14 1.04  CALCIUM 8.4*  --  8.4*  --  9.0 8.7*  MG  --   --   --  1.7 1.7 1.8   GFR: Estimated Creatinine Clearance: 42.4 mL/min (by C-G formula based on SCr of 1.04 mg/dL).  Liver Function Tests: Recent Labs  Lab 02/17/2017 2218  AST 39  ALT 23  ALKPHOS 84  BILITOT 0.4  PROT 6.1*  ALBUMIN 3.3*    Coagulation Profile: Recent Labs  Lab 02/24/2017 2218  INR 1.18    Cardiac Enzymes: Recent Labs  Lab 03/09/17 0718 03/09/17 1300 03/10/17 0347 03/11/17 0431 03/12/17 0300  TROPONINI 13.08* 40.84* >65.00* 34.86* 21.86*    Scheduled Meds: . amiodarone  200 mg Oral BID  . aspirin  81 mg Oral Daily  . atorvastatin  10 mg Oral q1800  . enoxaparin (LOVENOX) injection  60 mg Subcutaneous Q12H  . feeding supplement (ENSURE ENLIVE)  237 mL Oral BID BM  . [START ON 03/13/2017] levothyroxine  100 mcg Oral QAC breakfast  . mouth rinse  15 mL Mouth Rinse BID  . metoprolol tartrate  25 mg Oral BID  . polyethylene glycol  17 g Oral BID  . risperiDONE  0.5 mg  Oral BID    LOS: 3 days   Cherene Altes, MD [facility] Office  [phone] Pager - Text Page per Amion as per below:  On-Call/Text Page:      [email or web address]      password [email or web address]  If 7PM-7AM, please contact night-coverage [email or web address] Password [email or web address] 03/12/2017, 2:59 PM

## 2017-03-12 NOTE — Progress Notes (Signed)
ANTICOAGULATION CONSULT NOTE - Initial Consult  Pharmacy Consult for enoxaparin Indication: atrial fibrillation  No Known Allergies  Patient Measurements: Height: 5\' 8"  (172.7 cm) Weight: 134 lb 11.2 oz (61.1 kg) IBW/kg (Calculated) : 68.4   Vital Signs: Temp: 97.5 F (36.4 C) (03/02 0803) Temp Source: Axillary (03/02 0803) BP: 138/84 (03/02 0803) Pulse Rate: 141 (03/02 0803)  Labs: Recent Labs    03/10/17 0347 03/11/17 0431 03/11/17 1443 03/12/17 0300  HGB 11.1* 11.0*  --  10.9*  HCT 33.4* 33.5*  --  33.4*  PLT 125* 109*  --  113*  HEPARINUNFRC 0.39 0.15* 0.21* 0.25*  CREATININE 1.32* 1.14  --  1.04  TROPONINI >65.00* 34.86*  --  21.86*    Estimated Creatinine Clearance: 42.4 mL/min (by C-G formula based on SCr of 1.04 mg/dL).   Medical History: Past Medical History:  Diagnosis Date  . Anemia   . Anxiety   . ASCVD (arteriosclerotic cardiovascular disease)    2 MI's in 1990s  . Cataract   . Chronic renal insufficiency, stage II (mild)    CrCl in the 60s  . Dementia   . Depression   . DJD (degenerative joint disease)   . Esophageal dysphagia    Secondary to Schatzki's ring with dilation x2, most recently in 06/09, negative for H. pylori  . Gallstones   . GERD (gastroesophageal reflux disease)   . HTN (hypertension)    BP meds stopped 2016 due to recurrent orthostatic hypotension  . Hyperlipidemia   . Hypothyroidism   . Mild cognitive impairment with memory loss   . Myocardial infarction [facility]) [id number]   Cardiac cath done but no other intervention that we know of  . Orthostatic hypotension   . Psoriasis   . Thrombocytopenia ([place])    Platelets 99K on 09/2009 labs  . Ulcerative proctitis ([place])    Dx: 2000, started Asacol at that time.  Was taken off asacol 2014 and has done fine since (Dr. Sydell Axon, [facility])    Medications:  Scheduled:  . amiodarone  200 mg Oral BID  . aspirin  81 mg Oral Daily  . atorvastatin  10 mg Oral q1800  .  enoxaparin (LOVENOX) injection  60 mg Subcutaneous Q12H  . feeding supplement (ENSURE ENLIVE)  237 mL Oral BID BM  . [START ON 03/13/2017] levothyroxine  100 mcg Oral QAC breakfast  . mouth rinse  15 mL Mouth Rinse BID  . metoprolol tartrate  25 mg Oral BID  . polyethylene glycol  17 g Oral BID  . risperiDONE  0.5 mg Oral BID   Infusions:  . sodium chloride 10 mL/hr at 03/09/17 1311    Assessment: 34 YOM previously on heparin for medical management of NSTEMI, found with new onset atrial fibrillation. Heparin infusion was discontinued today ~12pm. Pharmacy consulted to dose enoxaparin for Afib while family/primary team decide on long term anticoagulation plans. Apparently patient has a history of frequent falls, although this is not a contraindication for DOACs. CBC is stable, no bleeding noted on heparin. Renal function at baseline, CrCl ~42.   Goal of Therapy:  Monitor platelets by anticoagulation protocol: Yes   Plan:  Initiate enoxaparin 60 mg Hector every 12 hours Monitor CBC, s/sx of bleeding Consider switch to DOAC (apixaban preferable in elderly)    Charlene Brooke, PharmD PGY1 Pharmacy Resident Phone: [phone] After 3:30PM please call [facility] [phone] 03/12/2017,1:23 PM

## 2017-03-12 NOTE — Progress Notes (Addendum)
Subjective:  No history is obtainable.  The wife is at bedside and download of old medical records and had a number of questions about echo results as well as diagnoses which we answered.  He has significant dementia and not able to answer any questions Wife notes that he falls frequently at home.  Objective:  Vital Signs in the last 24 hours: BP 138/84 (BP Location: Left Arm)   Pulse (!) 141   Temp (!) 97.5 F (36.4 C) (Axillary)   Resp (!) 42   Ht 5\' 8"  (1.727 m)   Wt 61.1 kg (134 lb 11.2 oz)   SpO2 (!) 86%   BMI 20.48 kg/m   Physical Exam: Raymond [name] elderly male currently in no acute distress Lungs:  Clear Cardiac:  Rapid irregular rhythm, normal S1 and S2, no S3,  2/6 systolic murmur at aortic valve Abdomen:  Soft, nontender, no masses Extremities:  No edema present  Intake/Output from previous day: 03/01 0701 - 03/02 0700 In: 843 [P.O.:120; I.V.:723] Out: 300 [Urine:300]  Weight Filed Weights   03/10/17 0357 03/11/17 0407 03/12/17 0434  Weight: 62.1 kg (136 lb 14.5 oz) 63.3 kg (139 lb 8.8 oz) 61.1 kg (134 lb 11.2 oz)    Lab Results: Basic Metabolic Panel: Recent Labs    03/11/17 0431 03/12/17 0300  NA 143 142  K 4.1 3.7  CL 106 106  CO2 26 25  GLUCOSE 142* 149*  BUN 24* 21*  CREATININE 1.14 1.04   CBC: Recent Labs    03/11/17 0431 03/12/17 0300  WBC 8.9 8.4  HGB 11.0* 10.9*  HCT 33.5* 33.4*  MCV 95.7 94.4  PLT 109* 113*   Cardiac Panel (last 3 results) Recent Labs    03/10/17 0347 03/11/17 0431 03/12/17 0300  TROPONINI >65.00* 34.86* 21.86*    Telemetry: Rapid atrial fibrillation  Assessment/Plan:  1.  Recent acute inferoposterior infarction not candidate for intervention no plans for invasive management 2.  New-onset of atrial fibrillation with rapid ventricular response-he was in sinus rhythm one year ago by EKG. 3.  Severe dementia that limits history and will affect management.  Recommendations:  Will try beta blocker to  slow ventricular rate.  I am not sure that he is a good candidate for long-term anticoagulation because of his wife notes that he falls frequently at home.  Change to oral amiodarone for now in hopes that he will convert. Answered wife's questions regarding echo results and other diagnoses. I'm going to stop his heparin and change him to Lovenox to help with the management.     Kerry Hough  MD [facility] Cardiology  03/12/2017, 10:14 AM

## 2017-03-12 NOTE — Progress Notes (Signed)
ANTICOAGULATION CONSULT NOTE - Follow Up Consult  Pharmacy Consult for Heparin Indication: STEMI  No Known Allergies  Patient Measurements: Height: 5\' 8"  (172.7 cm) Weight: 139 lb 8.8 oz (63.3 kg) IBW/kg (Calculated) : 68.4 Heparin Dosing Weight: 63 kg  Vital Signs: Temp: 98.7 F (37.1 C) (03/01 2354) Temp Source: Axillary (03/01 2354) BP: 112/74 (03/01 2354) Pulse Rate: 112 (03/01 2354)  Labs: Recent Labs    03/09/17 1300  03/10/17 0347 03/11/17 0431 03/11/17 1443 03/12/17 0300  HGB  --    < > 11.1* 11.0*  --  10.9*  HCT  --   --  33.4* 33.5*  --  33.4*  PLT  --   --  125* 109*  --  113*  HEPARINUNFRC  --    < > 0.39 0.15* 0.21* 0.25*  CREATININE  --   --  1.32* 1.14  --   --   TROPONINI 40.84*  --  >65.00* 34.86*  --   --    < > = values in this interval not displayed.    Estimated Creatinine Clearance: 40.1 mL/min (by C-G formula based on SCr of 1.14 mg/dL).  Assessment: 84 yr old male continues on IV heparin for STEMI.  Medical management planned.  Heparin level remains subtherapeutic (0.25) on 1000 units/hr. Thrombocytopenia stable, no bleeding reported. Noted plan to stop IV heparin this morning 3/2. Will conservatively increase rate and follow for d/c on morning rounds.   Goal of Therapy:  Heparin level 0.3-0.7 units/ml Monitor platelets by anticoagulation protocol: Yes   Plan:   Increase heparin drip to 1100 units/hr.  Next heparin level and CBC in am.  Expecting heparin drip to stop this morning 3/2 per Cardiology plan.  Erin Hearing PharmD., BCPS Clinical Pharmacist 03/12/2017 4:01 AM

## 2017-03-13 MED ORDER — METOPROLOL TARTRATE 50 MG PO TABS: 50.0000 mg | ORAL_TABLET | Freq: Two times a day (BID) | ORAL | Status: DC

## 2017-03-13 MED ORDER — DIGOXIN 0.25 MG/ML IJ SOLN
0.2500 mg | Freq: Four times a day (QID) | INTRAMUSCULAR | Status: AC
  Administered 2017-03-13 (×2): 0.25 mg via INTRAVENOUS
  Filled 2017-03-13 (×2): qty 2

## 2017-03-13 MED ORDER — METOPROLOL TARTRATE 5 MG/5ML IV SOLN
5.0000 mg | INTRAVENOUS | Status: DC | PRN
  Administered 2017-03-14 (×2): 5 mg via INTRAVENOUS
  Filled 2017-03-13 (×3): qty 5

## 2017-03-13 MED ORDER — METOPROLOL TARTRATE 25 MG PO TABS
25.0000 mg | ORAL_TABLET | Freq: Three times a day (TID) | ORAL | Status: DC
  Administered 2017-03-13 (×2): 25 mg via ORAL
  Filled 2017-03-13 (×2): qty 1

## 2017-03-13 NOTE — Progress Notes (Addendum)
[place] TEAM 1 - Stepdown/ICU TEAM  Raymond [name]  NLG:[id number] DOB: Jul 24, 1926 DOA: 02/24/2017 PCP: Raylene Everts, MD    Brief Narrative:  84 y.o. male with a history of severe dementia with behavioral disturbance, CAD, hypothyroidism, chronic anemia and thrombocytopenia, and anxiety who presented to the ED w/ chest pain after a fall to the ground.  He was noted to be diaphoretic and pale.  EMS was called and patient was noted to be in atrial fibrillation with RVR with marked ST depression in V1 through V3 concerning for posterior STEMI.  Upon arrival to the ED the patient was found to be hypothermic with temperature 35.4 C, tachycardic in the 120s, with blood pressure of 72/58.  EKG features atrial fibrillation with incomplete LBBB and marked ST depression in V1 through V3.  Chest x-ray is notable for diffuse reticular opacity consistent with fibrosis, and patchy atelectasis or small infiltrates in the bases.  CT head was negative for acute intracranial abnormality.  Lactic acid was elevated to 3.61, and troponin was undetectable.  Cardiology was consulted and the pt was treated with rectal aspirin and a heparin infusion.  The family declined coronary intervention.  Subjective: RVR has been more difficult to control today.  The pt is awake but calm at present.  He does not look uncomfortably or to be in any respiratory distress.    Assessment & Plan:  Acute MI Not a candidate for intervention - Cards has been following - EF 40-45% via TTE w/ akinesis of the basal-mid-inferolateral, inferior, and infero-septal myocardium c/w RCA MI - appears to be stabilizing from a medical standpoint  Ischemic Systolic CHF (EF 08-14%) and Grade 2 Diastolic CHF  No signif volume overload in setting of extremely limited intake   Newly diagnosed Afib w/ RVR Rate proving difficult to control today - is not a candidate for long term anticoag given high fall risk and severe cognitive decline - changed  lovenox to DVT prophy dose only 3/2 - digoxin being dosed by Cards - increase BB as BP allows   SIRS  SIRS due to stress of AMI - no clincal findings suggestive of infection   Hypothyroidism Cont home synthroid dose  Advanced dementia > terminal dementia Signif other relates a course of signif functional decline over last year, to the point that pt was fully dependent on others for care - we discussed the fact that his acute illness will likely accelerate this cognitive decline, and that he will not likely recover even to his prior level of fxn - I have advised her that he will very likely not be able to be cared for at home - we have also agreed that a feeding tube in this situation is not appropriate, and that we will accept whatever nutrition he is able to consume voluntarily   Anemia   Thrombocytopenia  Goals of care  Spoke w/ his POA (who is not his son) at bedside - discussed focusing on controlling his anxiety and discomfort, and the need to have him placed in a facility where 24hr support will be available - unclear to me at this time if SNF or a residential hospice house would be most appropriate  DVT prophylaxis: lovenox  Code Status: DNR - NO CODE Family Communication: spoke w/ POA/significant other at bedside  Disposition Plan: pending - CSW, Case Manager, and ?Hospice to comment on options this week   Consultants:  Cardiology   Procedures: none  Antimicrobials:  none   Objective:  Blood pressure 118/90, pulse (!) 43, temperature 98.6 F (37 C), temperature source Axillary, resp. rate 18, height 5\' 8"  (1.727 m), weight 61.1 kg (134 lb 11.2 oz), SpO2 100 %.  Intake/Output Summary (Last 24 hours) at 03/13/2017 1506 Last data filed at 03/13/2017 0630 Gross per 24 hour  Intake 110 ml  Output -  Net 110 ml   Filed Weights   03/10/17 0357 03/11/17 0407 03/12/17 0434  Weight: 62.1 kg (136 lb 14.5 oz) 63.3 kg (139 lb 8.8 oz) 61.1 kg (134 lb 11.2 oz)     Examination: General: No acute respiratory distress Lungs: CTA B - no wheezing  Cardiovascular: tachycardic - irreg irreg - no M  Abdomen: NT/ND, soft, bs+ Extremities: No signif edema bilateral lower extremities   CBC: Recent Labs  Lab 02/25/2017 2218 02/24/2017 2238 03/09/17 0221 03/10/17 0347 03/11/17 0431 03/12/17 0300  WBC 9.7  --  9.7 10.4 8.9 8.4  NEUTROABS 7.6  --   --   --   --   --   HGB 11.1* 11.2* 10.7* 11.1* 11.0* 10.9*  HCT 33.4* 33.0* 32.1* 33.4* 33.5* 33.4*  MCV 96.3  --  97.0 94.4 95.7 94.4  PLT 108*  --  114* 125* 109* 478*   Basic Metabolic Panel: Recent Labs  Lab 02/11/2017 2218 02/13/2017 2238 03/10/17 0347 03/10/17 1356 03/11/17 0431 03/12/17 0300  NA 138 141 141  --  143 142  K 4.0 4.1 3.3* 4.0 4.1 3.7  CL 107 105 103  --  106 106  CO2 21*  --  26  --  26 25  GLUCOSE 153* 148* 125*  --  142* 149*  BUN 13 14 20   --  24* 21*  CREATININE 1.17 1.10 1.32*  --  1.14 1.04  CALCIUM 8.4*  --  8.4*  --  9.0 8.7*  MG  --   --   --  1.7 1.7 1.8   GFR: Estimated Creatinine Clearance: 42.4 mL/min (by C-G formula based on SCr of 1.04 mg/dL).  Liver Function Tests: Recent Labs  Lab 02/27/2017 2218  AST 39  ALT 23  ALKPHOS 84  BILITOT 0.4  PROT 6.1*  ALBUMIN 3.3*    Coagulation Profile: Recent Labs  Lab 03/04/2017 2218  INR 1.18    Cardiac Enzymes: Recent Labs  Lab 03/09/17 0718 03/09/17 1300 03/10/17 0347 03/11/17 0431 03/12/17 0300  TROPONINI 13.08* 40.84* >65.00* 34.86* 21.86*    Scheduled Meds: . amiodarone  200 mg Oral BID  . aspirin  81 mg Oral Daily  . atorvastatin  10 mg Oral q1800  . digoxin  0.25 mg Intravenous Q6H  . enoxaparin (LOVENOX) injection  40 mg Subcutaneous Q24H  . feeding supplement (ENSURE ENLIVE)  237 mL Oral BID BM  . levothyroxine  100 mcg Oral QAC breakfast  . mouth rinse  15 mL Mouth Rinse BID  . metoprolol tartrate  25 mg Oral BID  . polyethylene glycol  17 g Oral BID  . risperiDONE  0.5 mg Oral BID     LOS: 4 days   Cherene Altes, MD [facility] Office  [phone] Pager - Text Page per Amion as per below:  On-Call/Text Page:      [email or web address]      password [email or web address]  If 7PM-7AM, please contact night-coverage [email or web address] Password [email or web address] 03/13/2017, 3:06 PM

## 2017-03-13 NOTE — Progress Notes (Signed)
Subjective:  No history is obtainable.  Not able to respond or answer questions.  Remains in rapid atrial fibrillation and blood pressure is borderline.  Objective:  Vital Signs in the last 24 hours: BP 100/75 (BP Location: Left Arm)   Pulse (!) 136   Temp 99.5 F (37.5 C) (Axillary)   Resp (!) 21   Ht 5\' 8"  (1.727 m)   Wt 61.1 kg (134 lb 11.2 oz)   SpO2 98%   BMI 20.48 kg/m   Physical Exam: Raymond [name] elderly male currently in no acute distress Lungs:  Clear Cardiac:  Rapid irregular rhythm, normal S1 and S2, no S3,  2/6 systolic murmur at aortic valve Abdomen:  Soft, nontender, no masses Extremities:  No edema present  Intake/Output from previous day: 03/02 0701 - 03/03 0700 In: 110 [P.O.:110] Out: -   Weight Filed Weights   03/10/17 0357 03/11/17 0407 03/12/17 0434  Weight: 62.1 kg (136 lb 14.5 oz) 63.3 kg (139 lb 8.8 oz) 61.1 kg (134 lb 11.2 oz)    Lab Results: Basic Metabolic Panel: Recent Labs    03/11/17 0431 03/12/17 0300  NA 143 142  K 4.1 3.7  CL 106 106  CO2 26 25  GLUCOSE 142* 149*  BUN 24* 21*  CREATININE 1.14 1.04   CBC: Recent Labs    03/11/17 0431 03/12/17 0300  WBC 8.9 8.4  HGB 11.0* 10.9*  HCT 33.5* 33.4*  MCV 95.7 94.4  PLT 109* 113*   Cardiac Panel (last 3 results) Recent Labs    03/11/17 0431 03/12/17 0300  TROPONINI 34.86* 21.86*    Telemetry: Rapid atrial fibrillation  Assessment/Plan:  1.  Recent acute inferoposterior infarction not candidate for intervention no plans for invasive management 2.  New-onset of atrial fibrillation with rapid ventricular response-he was in sinus rhythm one year ago by EKG. 3.  Severe dementia that limits history and will affect management.  Recommendations:  Add a couple of doses of intravenousLanoxin to try to help slow ventricular response.   Blood pressure is borderline at the present time.  Currently clear.     Kerry Hough  MD [facility] Cardiology  03/13/2017, 9:20  AM

## 2017-03-13 NOTE — Progress Notes (Signed)
Request for pastoral support for wife and family of patient who has alzheimers and heart attack-not doing well.  Had prayer and visit. Conard Novak, Chaplain   03/13/17 1300  Clinical Encounter Type  Visited With Patient and family together  Visit Type Initial;Psychological support;Spiritual support;Critical Care  Referral From Family  Consult/Referral To Chaplain  Stress Factors  Patient Stress Factors None identified  Family Stress Factors Health changes

## 2017-03-14 DIAGNOSIS — M84451D Pathological fracture, right femur, subsequent encounter for fracture with routine healing: Secondary | ICD-10-CM

## 2017-03-14 DIAGNOSIS — I2111 ST elevation (STEMI) myocardial infarction involving right coronary artery: Secondary | ICD-10-CM

## 2017-03-14 DIAGNOSIS — I481 Persistent atrial fibrillation: Secondary | ICD-10-CM

## 2017-03-14 LAB — CULTURE, BLOOD (ROUTINE X 2)
CULTURE: NO GROWTH
SPECIAL REQUESTS: ADEQUATE

## 2017-03-14 MED ORDER — METOPROLOL TARTRATE 25 MG PO TABS
37.5000 mg | ORAL_TABLET | Freq: Three times a day (TID) | ORAL | Status: DC
  Administered 2017-03-14 – 2017-03-16 (×5): 37.5 mg via ORAL
  Filled 2017-03-14 (×6): qty 1

## 2017-03-14 MED ORDER — DILTIAZEM HCL 60 MG PO TABS
30.0000 mg | ORAL_TABLET | Freq: Four times a day (QID) | ORAL | Status: DC
  Administered 2017-03-14 – 2017-03-16 (×8): 30 mg via ORAL
  Filled 2017-03-14 (×8): qty 1

## 2017-03-14 MED ORDER — METOPROLOL TARTRATE 25 MG PO TABS
37.5000 mg | ORAL_TABLET | Freq: Three times a day (TID) | ORAL | Status: DC
  Administered 2017-03-14 (×2): 37.5 mg via ORAL
  Filled 2017-03-14 (×2): qty 1

## 2017-03-14 MED ORDER — CLOPIDOGREL BISULFATE 75 MG PO TABS
75.0000 mg | ORAL_TABLET | Freq: Every day | ORAL | Status: DC
  Administered 2017-03-14 – 2017-03-16 (×3): 75 mg via ORAL
  Filled 2017-03-14 (×3): qty 1

## 2017-03-14 NOTE — Progress Notes (Signed)
[place] TEAM 1 - Stepdown/ICU TEAM  Raymond [name]  XNA:[id number] DOB: [birth year] DOA: 03/10/2017 PCP: Raylene Everts, MD    Brief Narrative:  84 y.o. male with a history of severe dementia with behavioral disturbance, CAD, hypothyroidism, chronic anemia and thrombocytopenia, and anxiety who presented to the ED w/ chest pain after a fall to the ground.  He was noted to be diaphoretic and pale.  EMS was called and patient was noted to be in atrial fibrillation with RVR with marked ST depression in V1 through V3 concerning for posterior STEMI.  Upon arrival to the ED the patient was found to be hypothermic with temperature 35.4 C, tachycardic in the 120s, with blood pressure of 72/58.  EKG features atrial fibrillation with incomplete LBBB and marked ST depression in V1 through V3.  Chest x-ray is notable for diffuse reticular opacity consistent with fibrosis, and patchy atelectasis or small infiltrates in the bases.  CT head was negative for acute intracranial abnormality.  Lactic acid was elevated to 3.61, and troponin was undetectable.  Cardiology was consulted and the pt was treated with rectal aspirin and a heparin infusion.  The family declined coronary intervention.  Subjective: Resting comfortably in bed, w/o evidence of distress.  HR remains poorly controlled.    Assessment & Plan:  Acute MI Not a candidate for intervention - Cards has been following - EF 40-45% via TTE w/ akinesis of the basal-mid-inferolateral, inferior, and infero-septal myocardium c/w RCA MI - appears to be stabilizing from a medical standpoint - wean O2 as able   Ischemic Systolic CHF (EF 54-27%) and Grade 2 Diastolic CHF  No signif volume overload in setting of extremely limited intake   Newly diagnosed Afib w/ RVR Rate still proving difficult to control - is not a candidate for long term anticoag given high fall risk and severe cognitive decline - changed lovenox to DVT prophy dose only 3/2 - digoxin being  dosed by Cards - increased BB again -   SIRS  SIRS due to stress of AMI - no clincal findings suggestive of infection   Hypothyroidism Cont home synthroid dose  Advanced dementia > terminal dementia Signif other relates a course of signif functional decline over last year, to the point that pt was fully dependent on others for care - we discussed the fact that his acute illness will likely accelerate this cognitive decline, and that he will not likely recover even to his prior level of fxn - I have advised her that he will very likely not be able to be cared for at home - we have also agreed that a feeding tube in this situation is not appropriate, and that we will accept whatever nutrition he is able to consume voluntarily   Anemia   Thrombocytopenia  Goals of care  Spoke w/ his POA (who is not his son) at bedside - discussed focusing on controlling his anxiety and discomfort, and the need to have him placed in a facility where 24hr support will be available - unclear to me at this time if SNF or a residential hospice house would be most appropriate - will ask CSW to assist   DVT prophylaxis: lovenox  Code Status: DNR - NO CODE Family Communication: no family present today  Disposition Plan: pending - CSW to investigate hospice options    Consultants:  Cardiology   Procedures: none  Antimicrobials:  none   Objective: Blood pressure 94/79, pulse 64, temperature 99.2 F (37.3 C), temperature source  Axillary, resp. rate (!) 33, height 5\' 8"  (1.727 m), weight 60.4 kg (133 lb 2.5 oz), SpO2 93 %.  Intake/Output Summary (Last 24 hours) at 03/14/2017 1702 Last data filed at 03/14/2017 1239 Gross per 24 hour  Intake 180 ml  Output 800 ml  Net -620 ml   Filed Weights   03/11/17 0407 03/12/17 0434 03/14/17 0352  Weight: 63.3 kg (139 lb 8.8 oz) 61.1 kg (134 lb 11.2 oz) 60.4 kg (133 lb 2.5 oz)    Examination: General: No acute respiratory distress - non-communicative  Lungs: CTA  B Cardiovascular: tachycardic - irreg irreg  Abdomen: NT/ND, soft, bs+ Extremities: No signif edema B LE    CBC: Recent Labs  Lab 02/24/2017 2218 03/03/2017 2238 03/09/17 0221 03/10/17 0347 03/11/17 0431 03/12/17 0300  WBC 9.7  --  9.7 10.4 8.9 8.4  NEUTROABS 7.6  --   --   --   --   --   HGB 11.1* 11.2* 10.7* 11.1* 11.0* 10.9*  HCT 33.4* 33.0* 32.1* 33.4* 33.5* 33.4*  MCV 96.3  --  97.0 94.4 95.7 94.4  PLT 108*  --  114* 125* 109* 426*   Basic Metabolic Panel: Recent Labs  Lab 03/03/2017 2218 03/02/2017 2238 03/10/17 0347 03/10/17 1356 03/11/17 0431 03/12/17 0300  NA 138 141 141  --  143 142  K 4.0 4.1 3.3* 4.0 4.1 3.7  CL 107 105 103  --  106 106  CO2 21*  --  26  --  26 25  GLUCOSE 153* 148* 125*  --  142* 149*  BUN 13 14 20   --  24* 21*  CREATININE 1.17 1.10 1.32*  --  1.14 1.04  CALCIUM 8.4*  --  8.4*  --  9.0 8.7*  MG  --   --   --  1.7 1.7 1.8   GFR: Estimated Creatinine Clearance: 41.9 mL/min (by C-G formula based on SCr of 1.04 mg/dL).  Liver Function Tests: Recent Labs  Lab 02/22/2017 2218  AST 39  ALT 23  ALKPHOS 84  BILITOT 0.4  PROT 6.1*  ALBUMIN 3.3*    Coagulation Profile: Recent Labs  Lab 02/13/2017 2218  INR 1.18    Cardiac Enzymes: Recent Labs  Lab 03/09/17 0718 03/09/17 1300 03/10/17 0347 03/11/17 0431 03/12/17 0300  TROPONINI 13.08* 40.84* >65.00* 34.86* 21.86*    Scheduled Meds: . amiodarone  200 mg Oral BID  . aspirin  81 mg Oral Daily  . atorvastatin  10 mg Oral q1800  . clopidogrel  75 mg Oral Daily  . enoxaparin (LOVENOX) injection  40 mg Subcutaneous Q24H  . feeding supplement (ENSURE ENLIVE)  237 mL Oral BID BM  . levothyroxine  100 mcg Oral QAC breakfast  . mouth rinse  15 mL Mouth Rinse BID  . metoprolol tartrate  37.5 mg Oral TID  . polyethylene glycol  17 g Oral BID  . risperiDONE  0.5 mg Oral BID    LOS: 5 days   Cherene Altes, MD [facility] Office  [phone] Pager - Text Page per Amion as  per below:  On-Call/Text Page:      [email or web address]      password [email or web address]  If 7PM-7AM, please contact night-coverage [email or web address] Password [email or web address] 03/14/2017, 5:02 PM

## 2017-03-14 NOTE — Progress Notes (Addendum)
Progress Note  Patient Name: Raymond [name] Date of Encounter: 03/14/2017  Primary Cardiologist: Jenkins Rouge, MD  Subjective   Not able to obtain history. Does not answer questions. No distress noted.   Inpatient Medications    Scheduled Meds: . amiodarone  200 mg Oral BID  . aspirin  81 mg Oral Daily  . atorvastatin  10 mg Oral q1800  . enoxaparin (LOVENOX) injection  40 mg Subcutaneous Q24H  . feeding supplement (ENSURE ENLIVE)  237 mL Oral BID BM  . levothyroxine  100 mcg Oral QAC breakfast  . mouth rinse  15 mL Mouth Rinse BID  . metoprolol tartrate  25 mg Oral TID  . polyethylene glycol  17 g Oral BID  . risperiDONE  0.5 mg Oral BID   Continuous Infusions:  PRN Meds: acetaminophen, haloperidol lactate, LORazepam, metoprolol tartrate, morphine injection, ondansetron (ZOFRAN) IV   Vital Signs    Vitals:   03/13/17 2354 03/14/17 0347 03/14/17 0352 03/14/17 0759  BP: 104/82 105/81  118/84  Pulse: (!) 120 (!) 120    Resp: (!) 25 16    Temp:  99.9 F (37.7 C)  99.5 F (37.5 C)  TempSrc:  Axillary  Axillary  SpO2: 91%     Weight:   133 lb 2.5 oz (60.4 kg)   Height:        Intake/Output Summary (Last 24 hours) at 03/14/2017 0823 Last data filed at 03/14/2017 0348 Gross per 24 hour  Intake 0 ml  Output 550 ml  Net -550 ml   Filed Weights   03/11/17 0407 03/12/17 0434 03/14/17 0352  Weight: 139 lb 8.8 oz (63.3 kg) 134 lb 11.2 oz (61.1 kg) 133 lb 2.5 oz (60.4 kg)    Telemetry    Afib RVR - Personally Reviewed  ECG    N/a - Personally Reviewed  Physical Exam   General: Thin, frail older male appearing in no acute distress. Head: Normocephalic, atraumatic.  Neck: Supple without bruits, JVD. Lungs:  Resp regular and unlabored, CTA. Heart: Tachy Irreg, S1, S2, no S3, S4, or murmur; no rub. Abdomen: Soft, non-tender, non-distended with normoactive bowel sounds.  Extremities: No clubbing, cyanosis, edema. Distal pedal pulses are 2+ bilaterally. Neuro:  Alert, but does not answer questions. Moves all extremities spontaneously.   Labs    Chemistry Recent Labs  Lab 02/23/2017 2218  03/10/17 0347 03/10/17 1356 03/11/17 0431 03/12/17 0300  NA 138   < > 141  --  143 142  K 4.0   < > 3.3* 4.0 4.1 3.7  CL 107   < > 103  --  106 106  CO2 21*  --  26  --  26 25  GLUCOSE 153*   < > 125*  --  142* 149*  BUN 13   < > 20  --  24* 21*  CREATININE 1.17   < > 1.32*  --  1.14 1.04  CALCIUM 8.4*  --  8.4*  --  9.0 8.7*  PROT 6.1*  --   --   --   --   --   ALBUMIN 3.3*  --   --   --   --   --   AST 39  --   --   --   --   --   ALT 23  --   --   --   --   --   ALKPHOS 84  --   --   --   --   --  BILITOT 0.4  --   --   --   --   --   GFRNONAA 54*  --  46*  --  55* >60  GFRAA >60  --  54*  --  >60 >60  ANIONGAP 10  --  12  --  11 11   < > = values in this interval not displayed.     Hematology Recent Labs  Lab 03/10/17 0347 03/11/17 0431 03/12/17 0300  WBC 10.4 8.9 8.4  RBC 3.54* 3.50*  3.50* 3.54*  HGB 11.1* 11.0* 10.9*  HCT 33.4* 33.5* 33.4*  MCV 94.4 95.7 94.4  MCH 31.4 31.4 30.8  MCHC 33.2 32.8 32.6  RDW 13.6 14.1 13.9  PLT 125* 109* 113*    Cardiac Enzymes Recent Labs  Lab 03/09/17 1300 03/10/17 0347 03/11/17 0431 03/12/17 0300  TROPONINI 40.84* >65.00* 34.86* 21.86*    Recent Labs  Lab 02/24/2017 2237  TROPIPOC 0.00     BNP Recent Labs  Lab 03/04/2017 2218  BNP 64.0     DDimer No results for input(s): DDIMER in the last 168 hours.    Radiology    No results found.  Cardiac Studies   TTE: 03/10/17  Study Conclusions  - Left ventricle: The cavity size was normal. Wall thickness was   normal. Systolic function was mildly to moderately reduced. The   estimated ejection fraction was in the range of 40% to 45%.   Akinesis and scarring of the basal-midinferolateral, inferior,   and inferoseptal myocardium; consistent with infarction in the   distribution of the right coronary artery. Features are    consistent with a pseudonormal left ventricular filling pattern,   with concomitant abnormal relaxation and increased filling   pressure (grade 2 diastolic dysfunction). - Aortic valve: Right coronary cusp mobility was severely   restricted. There was mild to moderate stenosis. There was   moderate regurgitation directed centrally in the LVOT. Valve area   (VTI): 1.37 cm^2. Valve area (Vmax): 1.18 cm^2. Valve area   (Vmean): 1.17 cm^2. - Mitral valve: Mildly to moderately calcified annulus. There was   mild to moderate regurgitation directed centrally.  Patient Profile     84 y.o. male with acute posterior MI (planned for medical therapy given advanced dementia), hypothyroidism, anemia, thrombocytopenia who presented with altered mental status and chest pain.   Assessment & Plan    1. MI: Planned to continue medical therapy for this given his advanced dementia. Medications are being added as tolerated.   2. Afib RVR: rates remain elevated. Will attempt to further titrate up metoprolol to 37.5mg  TID and follow blood pressures. Not a candidate for [place].   3. Dementia: Unable to obtain any history from the patient.   Signed, Reino Bellis, NP  03/14/2017, 8:23 AM  Pager # [phone]   For questions or updates, please contact [place] Please consult [email or web address] for contact info under Cardiology/STEMI.    Patient seen and examined. Agree with assessment and plan. No chest pain. AF with RVR rate now 130 - 155. On amiodarone 200 mg bid. Metoprolol increased to 37.5 mg tid; change q 8 hrs. Received iv dig yesterday. Peak troponin >65; decreased to 21.86 on 3/2. Echo EF 40 - 45% with RCA wall motion abnormality. With significant dementia, medical therapy.  With ACS will add plavix to ASA. F/U BP; may need ACE-I post MI if BP can tolerate once rate controlled.   Troy Sine, MD, [facility] 03/14/2017 10:07 AM

## 2017-03-14 NOTE — Care Management Important Message (Signed)
Important Message  Patient Details  Name: Raymond [name] MRN: [id number] Date of Birth: 02-04-1926   Medicare Important Message Given:  Yes    Orbie Pyo 03/14/2017, 12:13 PM

## 2017-03-14 NOTE — Plan of Care (Signed)
  Activity: Risk for activity intolerance will decrease 03/14/2017 2059 - Not Progressing by Claudine Mouton, RN  Pt. Totally dependent and on complete bedrest.

## 2017-03-15 LAB — CBC
HCT: 35.5 % — ABNORMAL LOW (ref 39.0–52.0)
Hemoglobin: 11.4 g/dL — ABNORMAL LOW (ref 13.0–17.0)
MCH: 31.4 pg (ref 26.0–34.0)
MCHC: 32.1 g/dL (ref 30.0–36.0)
MCV: 97.8 fL (ref 78.0–100.0)
PLATELETS: 129 10*3/uL — AB (ref 150–400)
RBC: 3.63 MIL/uL — ABNORMAL LOW (ref 4.22–5.81)
RDW: 14.3 % (ref 11.5–15.5)
WBC: 11.1 10*3/uL — ABNORMAL HIGH (ref 4.0–10.5)

## 2017-03-15 LAB — BASIC METABOLIC PANEL
ANION GAP: 11 (ref 5–15)
BUN: 31 mg/dL — ABNORMAL HIGH (ref 6–20)
CALCIUM: 8.9 mg/dL (ref 8.9–10.3)
CO2: 26 mmol/L (ref 22–32)
Chloride: 115 mmol/L — ABNORMAL HIGH (ref 101–111)
Creatinine, Ser: 1.25 mg/dL — ABNORMAL HIGH (ref 0.61–1.24)
GFR calc Af Amer: 57 mL/min — ABNORMAL LOW (ref >60)
GFR, EST NON AFRICAN AMERICAN: 50 mL/min — AB (ref >60)
GLUCOSE: 137 mg/dL — AB (ref 65–99)
Potassium: 3.2 mmol/L — ABNORMAL LOW (ref 3.5–5.1)
Sodium: 152 mmol/L — ABNORMAL HIGH (ref 135–145)

## 2017-03-15 LAB — MAGNESIUM: Magnesium: 2.2 mg/dL (ref 1.7–2.4)

## 2017-03-15 MED ORDER — MORPHINE SULFATE (CONCENTRATE) 10 MG/0.5ML PO SOLN: 10.0000 mg | ORAL | Status: DC | PRN

## 2017-03-15 MED ORDER — DIGOXIN 125 MCG PO TABS
0.0625 mg | ORAL_TABLET | Freq: Every day | ORAL | Status: DC
  Administered 2017-03-15 – 2017-03-16 (×2): 0.0625 mg via ORAL
  Filled 2017-03-15 (×2): qty 1

## 2017-03-15 MED ORDER — SODIUM CHLORIDE 0.45 % IV BOLUS
500.0000 mL | Freq: Once | INTRAVENOUS | Status: AC
  Administered 2017-03-15: 500 mL via INTRAVENOUS

## 2017-03-15 NOTE — Clinical Social Work Note (Signed)
Clinical Social Work Assessment  Patient Details  Name: Raymond [name] MRN: [id number] Date of Birth: May 22, 1926  Date of referral:  03/15/17               Reason for consult:  Facility Placement, End of Life/Hospice                Permission sought to share information with:  Facility Sport and exercise psychologist, Family Supports Permission granted to share information::  No(patient disoriented)  Name::     [place]::     Relationship::  spouse/POA  Contact Information:  [phone]  Housing/Transportation Living arrangements for the past 2 months:  Single Family Home Source of Information:  Spouse Patient Interpreter Needed:  None Criminal Activity/Legal Involvement Pertinent to Current Situation/Hospitalization:  No - Comment as needed Significant Relationships:  Adult Children, Spouse Lives with:  Self Do you feel safe going back to the place where you live?  Yes Need for family participation in patient care:  Yes (Comment)  Care giving concerns: Patient from home with significant other. PT and palliative recommendations pending.   Social Worker assessment / plan: CSW met with patient's significant other, Lula, at bedside. Patient was disoriented and slept during most of assessment. [name] stated that she and the patient have been together for several years and she is HCPOA, though they are not married. [name] has been spending most of her time at patient's home recently to help care for him. Patient has had aides and PT at the home but they have experienced challenges with many of the caregivers, and patient often refuses services. Patient was in rehab at [facility] at approximately this time last year, per [place].   [name] also discussed some conflict with patient's son, as son has been averse to patient staying in a care facility long term.  Lula agreeable to palliative medicine consult and has questions about hospice vs palliative and the different care options. [name] has a  good understanding of patient's heart condition, though does have questions about his prognosis and recovery. CSW to follow for PT and palliative recommendations and support with disposition planning.  Employment status:  Retired Forensic scientist:  Medicare PT Recommendations:  Not assessed at this time [place] / Referral to community resources:  [employer], Other (Comment Required)(possible hospice vs SNF)  Patient/Family's Response to care: Significant other appreciative of care.  Patient/Family's Understanding of and Emotional Response to Diagnosis, Current Treatment, and Prognosis: [name] has a good understanding of patient's condition and would like further information on patient's prognosis to help plan continued care for him.  Emotional Assessment Appearance:  Appears stated age Attitude/Demeanor/Rapport:  Unable to Assess Affect (typically observed):  Unable to Assess Orientation:  Oriented to Self Alcohol / Substance use:  Not Applicable Psych involvement (Current and /or in the community):  No (Comment)  Discharge Needs  Concerns to be addressed:  Discharge Planning Concerns, Care Coordination Readmission within the last 30 days:  No Current discharge risk:  Physical Impairment, Cognitively Impaired, Dependent with Mobility Barriers to Discharge:  Continued Medical Work up   Estanislado Emms, LCSW 03/15/2017, 12:14 PM

## 2017-03-15 NOTE — Evaluation (Signed)
Physical Therapy Evaluation Patient Details Name: Raymond [name] MRN: [id number] DOB: 1926/01/24 Today's Date: 03/15/2017   History of Present Illness  Pt is an 84 y.o. male with history of severe dementia with behavioral disturbance, CAD, hypothyroidism, chronic anemia and thrombocytopenia, and anxiety who presented to the ED with chest pain after falling to the ground. Admitted with acute MI, new A-fib with RVR, and SIRS.   Clinical Impression  Pt admitted with/for acute MI.  Pt needing significant cuing and assist for basic mobility and standing..  Pt currently limited functionally due to the problems listed. ( See problems list.)   Pt will benefit from PT to maximize function and safety in order to get ready for next venue listed below.     Follow Up Recommendations SNF;Supervision/Assistance - 24 hour    Equipment Recommendations  Other (comment)(TBA)    Recommendations for Other Services       Precautions / Restrictions Precautions Precautions: Fall Precaution Comments: Watch HR Restrictions Weight Bearing Restrictions: No      Mobility  Bed Mobility Overal bed mobility: Needs Assistance Bed Mobility: Rolling;Sidelying to Sit;Sit to Supine Rolling: Total assist;Max assist Sidelying to sit: Total assist;+2 for physical assistance   Sit to supine: Total assist;+2 for physical assistance   General bed mobility comments: Pt need consistent encouragement with assist given slowly so pt could assist as able.  The bed pad was use to easily slide the patient  Transfers Overall transfer level: Needs assistance Equipment used: 2 person hand held assist Transfers: Sit to/from Stand Sit to Stand: Total assist;+2 physical assistance         General transfer comment: 2 person assist to stand, assist to come forward and boost.  The trials needed for pericare and to make up his bed.  Ambulation/Gait             General Gait Details: not able today  Stairs             Wheelchair Mobility    Modified Rankin (Stroke Patients Only)       Balance Overall balance assessment: Needs assistance Sitting-balance support: No upper extremity supported Sitting balance-Leahy Scale: Poor Sitting balance - Comments: pt with consistent list to the left if not supported   Standing balance support: During functional activity Standing balance-Leahy Scale: Zero Standing balance comment: 3 standing trials for pericare/making the bed.  pt moaning as if fearful, with little bil LE assist noted.                             Pertinent Vitals/Pain Pain Assessment: Faces Faces Pain Scale: No hurt    Home Living Family/patient expects to be discharged to:: Skilled nursing facility                 Additional Comments: Pt has [place] CNAs 5-8 hours per day M-F and assistance from significant other. Significant other has her own home but does assist pt with ADL.     Prior Function Level of Independence: Needs assistance   Gait / Transfers Assistance Needed: Assistance to ambulate. Per significant other, does not use RW or cane.   ADL's / Homemaking Assistance Needed: Assistance from significant other for dressing and bed baths. Does not get in shower due to fear of falling.         Hand Dominance        Extremity/Trunk Assessment   Upper Extremity Assessment Upper Extremity Assessment: Generalized  weakness    Lower Extremity Assessment Lower Extremity Assessment: Generalized weakness;Difficult to assess due to impaired cognition       Communication   Communication: Other (comment)(minimal communication today)  Cognition Arousal/Alertness: Awake/alert Behavior During Therapy: WFL for tasks assessed/performed Overall Cognitive Status: History of cognitive impairments - at baseline                                 General Comments: Did make eye contact and smile at therapist. No verbal communication noted today.   Pt minimally  following commands with multimodal cues today.       General Comments General comments (skin integrity, edema, etc.): pt's sats on RA in the low to mid 90's.  HR was tachy at 120's to 150 variable but sustained and spikes as high as 181 bpm..  Mobility kept limited to clean up and then returned to supine.    Exercises     Assessment/Plan    PT Assessment Patient needs continued PT services  PT Problem List Decreased strength;Decreased activity tolerance;Decreased balance;Decreased mobility;Decreased cognition;Decreased safety awareness;Cardiopulmonary status limiting activity       PT Treatment Interventions Functional mobility training;Therapeutic activities;Balance training;Gait training;DME instruction;Patient/family education    PT Goals (Current goals can be found in the Care Plan section)  Acute Rehab PT Goals Patient Stated Goal: unable to state PT Goal Formulation: Patient unable to participate in goal setting Time For Goal Achievement: 03/29/17 Potential to Achieve Goals: Fair    Frequency Min 2X/week   Barriers to discharge        Co-evaluation               AM-PAC PT "6 Clicks" Daily Activity  Outcome Measure Difficulty turning over in bed (including adjusting bedclothes, sheets and blankets)?: Unable Difficulty moving from lying on back to sitting on the side of the bed? : Unable Difficulty sitting down on and standing up from a chair with arms (e.g., wheelchair, bedside commode, etc,.)?: Unable Help needed moving to and from a bed to chair (including a wheelchair)?: A Lot Help needed walking in hospital room?: Total   6 Click Score: 6    End of Session   Activity Tolerance: Other (comment)(limited by HR tachy.) Patient left: in bed;with call bell/phone within reach;with restraints reapplied Nurse Communication: Mobility status PT Visit Diagnosis: Other abnormalities of gait and mobility (R26.89);Muscle weakness (generalized) (M62.81);Difficulty in  walking, not elsewhere classified (R26.2)    Time: 1610-9604 PT Time Calculation (min) (ACUTE ONLY): 36 min   Charges:   PT Evaluation $PT Eval High Complexity: 1 High PT Treatments $Therapeutic Activity: 8-22 mins   PT G Codes:        03-16-17  Donnella Sham, PT [phone]  (pager)  Tessie Fass [place] 2017/03/16, 5:51 PM

## 2017-03-15 NOTE — Evaluation (Signed)
Occupational Therapy Evaluation Patient Details Name: Raymond [name] MRN: [id number] DOB: October 08, 1926 Today's Date: 03/15/2017    History of Present Illness Pt is an 84 y.o. male with history of severe dementia with behavioral disturbance, CAD, hypothyroidism, chronic anemia and thrombocytopenia, and anxiety who presented to the ED with chest pain after falling to the ground. Admitted with acute MI, new A-fib with RVR, and SIRS.    Clinical Impression   Per significant other, PTA pt required assistance for ambulation as well as dressing and bathing but was able to feed himself and interact. Pt presented to OT evaluation in bed and minimally interactive. He was able to smile at therapist a few times during session when engaged in central portion of visual field. Pt verbalized "oh man" once during session but otherwise not verbal throughout. He was able to follow commands to squeeze therapist's hand with multimodal cues. Pt currently requires total assistance for all ADL participation. Limited evaluation today due to highly variable HR and elevated HR to 150 at one point while OT talking with pt and ranging from 120-150 throughout. Feel pt will benefit from trial of OT while admitted to improve ability to engage in ADL once medically stable.     Follow Up Recommendations  SNF;Supervision/Assistance - 24 hour    Equipment Recommendations  Other (comment)(defer to next venue of care)    Recommendations for Other Services       Precautions / Restrictions Precautions Precautions: Fall Precaution Comments: Watch HR Restrictions Weight Bearing Restrictions: No      Mobility Bed Mobility               General bed mobility comments: Able to initiate rolling but limited to functional reaching tasks across body secondary to high HR.   Transfers                 General transfer comment: Medically unsafe to attempt    Balance                                            ADL either performed or assessed with clinical judgement   ADL Overall ADL's : Needs assistance/impaired                                       General ADL Comments: Total assistance today. When presented with washcloth on hand, pt smiled but unable to follow command to wash face with maximal cues.      Vision   Additional Comments: Limited visual field in periphery noted. Able to make eye contact and smile at therapist.     Perception     Praxis      Pertinent Vitals/Pain       Hand Dominance     Extremity/Trunk Assessment Upper Extremity Assessment Upper Extremity Assessment: Generalized weakness   Lower Extremity Assessment Lower Extremity Assessment: Defer to PT evaluation       Communication Communication Communication: (minimal communication today)   Cognition Arousal/Alertness: Awake/alert Behavior During Therapy: WFL for tasks assessed/performed Overall Cognitive Status: History of cognitive impairments - at baseline                                 General Comments: Did  make eye contact and smile at therapist. Verbalized "Oh man" once during session but otherwise no verbal communication noted. Pt minimally following commands with multimodal cues today.    General Comments  Discussed pt's current functional status compared with baseline with significant other. Pt has been interactive and engaging until recently.    Exercises     Shoulder Instructions      Home Living Family/patient expects to be discharged to:: Skilled nursing facility                                 Additional Comments: Pt has [facility] and assistance from significant other. Significant other has her own home but does assist pt with ADL.       Prior Functioning/Environment Level of Independence: Needs assistance  Gait / Transfers Assistance Needed: Assistance to ambulate. Per significant other, does not use RW or cane.  ADL's /  Homemaking Assistance Needed: Assistance from significant other for dressing and bed baths. Does not get in shower due to fear of falling.             OT Problem List: Decreased strength;Decreased range of motion;Decreased activity tolerance;Impaired balance (sitting and/or standing);Decreased safety awareness;Decreased knowledge of use of DME or AE;Decreased knowledge of precautions      OT Treatment/Interventions: Self-care/ADL training;Therapeutic exercise;DME and/or AE instruction;Energy conservation;Therapeutic activities;Patient/family education;Balance training    OT Goals(Current goals can be found in the care plan section) Acute Rehab OT Goals Patient Stated Goal: unable to state OT Goal Formulation: (with significant other) Time For Goal Achievement: 03/29/17 Potential to Achieve Goals: Good ADL Goals Pt Will Perform Grooming: with mod assist;sitting Additional ADL Goal #1: Pt will follow one-step commands with multi-modal cues during seated grooming tasks. Additional ADL Goal #2: Pt will complete bed mobility in preparation for ADL seated at EOB with overall mod assist. Additional ADL Goal #3: Pt will sit at EOB with min assist for balance and stable vital signs to engage in ADL tasks for 5 minutes.  OT Frequency: Min 1X/week   Barriers to D/C:            Co-evaluation              AM-PAC PT "6 Clicks" Daily Activity     Outcome Measure Help from another person eating meals?: Total Help from another person taking care of personal grooming?: Total Help from another person toileting, which includes using toliet, bedpan, or urinal?: Total Help from another person bathing (including washing, rinsing, drying)?: Total Help from another person to put on and taking off regular upper body clothing?: Total Help from another person to put on and taking off regular lower body clothing?: Total 6 Click Score: 6   End of Session Equipment Utilized During Treatment: Rolling  walker;Gait belt  Activity Tolerance: Patient tolerated treatment well Patient left: in bed;with call bell/phone within reach;with bed alarm set  OT Visit Diagnosis: Other abnormalities of gait and mobility (R26.89);Muscle weakness (generalized) (M62.81);Other symptoms and signs involving cognitive function                Time: 1426-1445 OT Time Calculation (min): 19 min Charges:  OT General Charges $OT Visit: 1 Visit OT Evaluation $OT Eval Moderate Complexity: 1 Mod G-Codes:     Norman Herrlich, MS OTR/L  Pager: [place] [facility] [place] 03/15/2017, 3:16 PM

## 2017-03-15 NOTE — Progress Notes (Signed)
Nutrition Follow-up  DOCUMENTATION CODES:   Non-severe (moderate) malnutrition in context of chronic illness  INTERVENTION:    Continue Ensue Enlive po BID, each supplement provides 350 kcal and 20 grams of protein  NUTRITION DIAGNOSIS:   Moderate Malnutrition related to chronic illness(dementia) as evidenced by mild fat depletion, moderate fat depletion, mild muscle depletion, moderate muscle depletion.  Ongoing  GOAL:   Patient will meet greater than or equal to 90% of their needs  Unmet  MONITOR:   PO intake, Supplement acceptance   ASSESSMENT:   84 yo male with PMH of ASCVD, hypothyroidism, HLD, anemia, thrombocytopenia, dysphagia, DJD, MI, HTN, dementia with behavioral disturbances, and anxiety who was admitted on 2/26 S/P fall with A fib, posterior STEMI, hypothermia. Plans for conservative medical treatment for MI to ensure comfort.  Patient with ongoing poor oral intake. He is consuming 0-25% of meals; refusing some meals. He is being offered Ensure Enlive supplements twice per day. Family and medical team has decided that a feeding tube is not appropriate. Suspect intake will continue to be inadequate.  CSW is working with patient/family to determine best disposition for patient.   Diet Order:  DIET - DYS 1 Room service appropriate? Yes; Fluid consistency: Thin  EDUCATION NEEDS:   No education needs have been identified at this time  Skin:  Skin Assessment: Skin Integrity Issues: Skin Integrity Issues:: Stage II, Stage I Stage I: buttocks Stage II: coccyx  Last BM:  3/4  Height:   Ht Readings from Last 1 Encounters:  02/21/2017 5\' 8"  (1.727 m)    Weight:   Wt Readings from Last 1 Encounters:  03/15/17 129 lb 6.6 oz (58.7 kg)   03/10/17 136 lb 14.5 oz (62.1 kg)    Ideal Body Weight:  70 kg  BMI:  Body mass index is 19.68 kg/m.  Estimated Nutritional Needs:   Kcal:  1700-1900  Protein:  85-100 gm  Fluid:  1.7-1.9 L    Molli Barrows,  RD, LDN, CNSC Pager [phone] After Hours Pager [phone]

## 2017-03-15 NOTE — NC FL2 (Signed)
[place] LEVEL OF CARE SCREENING TOOL     IDENTIFICATION  Patient Name: Raymond [name] Birthdate: [birth year] Sex: male Admission Date (Current Location): 02/25/2017  [facility] and [place] Number:  Herbalist and Address:  [facility] [place] [place], [place] [address], [place], [place] [address]      Provider Number: [id number]  Attending Physician Name and Address:  Cherene Altes, MD  Relative Name and Phone Number:  [name], significant other, [phone]    Current Level of Care: Hospital Recommended Level of Care: [place] Prior Approval Number:    Date Approved/Denied:   PASRR Number: [id number]  Discharge Plan: SNF    Current Diagnoses: Patient Active Problem List   Diagnosis Date Noted  . Malnutrition of moderate degree 03/12/2017  . ASCVD (arteriosclerotic cardiovascular disease) 03/09/2017  . Unspecified atrial fibrillation ([place]) 03/09/2017  . Sepsis, unspecified organism ([place]) 03/09/2017  . Pressure injury of skin 03/09/2017  . Acute ST elevation myocardial infarction (STEMI) of posterior wall (H[facility])   . Atrial fibrillation with RVR ([place])   . CKD (chronic kidney disease) stage 2, GFR 60-89 ml/min 06/18/2016  . Gait instability 06/14/2016  . Aortic valve calcification 04/07/2016  . History of myocardial infarction 04/07/2016  . GERD (gastroesophageal reflux disease) 10/04/2014  . Dementia 04/16/2013  . Hyperlipidemia 04/16/2013  . Esophageal dysphagia 06/23/2012  . Essential hypertension 04/08/2012  . Acute MI ([place])   . Hypothyroidism 10/15/2009  . Psoriasis 10/15/2009  . PAD (peripheral artery disease) ([place]) 09/11/2009  . Benign esophageal stricture 09/11/2009  . DIVERTICULAR DISEASE 09/11/2009    Orientation RESPIRATION BLADDER Height & Weight     Self  O2(nasal cannula 2L) Incontinent, External catheter Weight: 129 lb 6.6 oz (58.7 kg) Height:  5\' 8"  (172.7 cm)  BEHAVIORAL SYMPTOMS/MOOD  NEUROLOGICAL BOWEL NUTRITION STATUS      Incontinent Diet(please see DC summary)  AMBULATORY STATUS COMMUNICATION OF NEEDS Skin   Extensive Assist Verbally PU Stage and Appropriate Care(PU stage II coccyx, foam dressing; PU stage I buttocks, foam dressing)                       Personal Care Assistance Level of Assistance  Bathing, Feeding, Dressing Bathing Assistance: Maximum assistance Feeding assistance: Limited assistance Dressing Assistance: Maximum assistance     Functional Limitations Info  Sight, Hearing, Speech Sight Info: Adequate Hearing Info: Adequate Speech Info: Adequate    SPECIAL CARE FACTORS FREQUENCY  PT (By licensed PT), OT (By licensed OT)     PT Frequency: 5x/week OT Frequency: 5x/week            Contractures Contractures Info: Not present    Additional Factors Info  Code Status, Allergies, Psychotropic Code Status Info: DNR Allergies Info: No Known Allergies Psychotropic Info: risperdal         Current Medications (03/15/2017):  This is the current hospital active medication list Current Facility-Administered Medications  Medication Dose Route Frequency Provider Last Rate Last Dose  . acetaminophen (TYLENOL) tablet 650 mg  650 mg Oral Q4H PRN Cherene Altes, MD   650 mg at 03/14/17 1622  . amiodarone (PACERONE) tablet 200 mg  200 mg Oral BID Jacolyn Reedy, MD   200 mg at 03/15/17 7408  . aspirin chewable tablet 81 mg  81 mg Oral Daily Opyd, Ilene Qua, MD   81 mg at 03/15/17 0907  . atorvastatin (LIPITOR) tablet 10 mg  10 mg Oral q1800  Vianne Bulls, MD   10 mg at 03/14/17 1612  . clopidogrel (PLAVIX) tablet 75 mg  75 mg Oral Daily Troy Sine, MD   75 mg at 03/15/17 4696  . digoxin (LANOXIN) tablet 0.0625 mg  0.0625 mg Oral Daily Reino Bellis B, NP   0.0625 mg at 03/15/17 1351  . diltiazem (CARDIZEM) tablet 30 mg  30 mg Oral Q6H Cherene Altes, MD   30 mg at 03/15/17 1211  . enoxaparin (LOVENOX) injection 40 mg  40 mg  Subcutaneous Q24H Cherene Altes, MD   40 mg at 03/14/17 1612  . feeding supplement (ENSURE ENLIVE) (ENSURE ENLIVE) liquid 237 mL  237 mL Oral BID BM Cherene Altes, MD   237 mL at 03/15/17 1050  . haloperidol lactate (HALDOL) injection 2 mg  2 mg Intravenous Q6H PRN Opyd, Ilene Qua, MD   1 mg at 03/09/17 0357  . levothyroxine (SYNTHROID, LEVOTHROID) tablet 100 mcg  100 mcg Oral QAC breakfast Charlton Haws, [place]   100 mcg at 03/15/17 2952  . LORazepam (ATIVAN) injection 1 mg  1 mg Intravenous Q4H PRN Cherene Altes, MD   1 mg at 03/13/17 1621  . MEDLINE mouth rinse  15 mL Mouth Rinse BID Cherene Altes, MD   15 mL at 03/15/17 1221  . metoprolol tartrate (LOPRESSOR) injection 5 mg  5 mg Intravenous Q4H PRN Cherene Altes, MD   5 mg at 03/14/17 0450  . metoprolol tartrate (LOPRESSOR) tablet 37.5 mg  37.5 mg Oral Q8H Cherene Altes, MD   37.5 mg at 03/15/17 1350  . morphine 4 MG/ML injection 2-4 mg  2-4 mg Intravenous Q1H PRN Cherene Altes, MD   2 mg at 03/15/17 1348  . morphine CONCENTRATE 10 MG/0.5ML oral solution 10 mg  10 mg Sublingual Q1H PRN Earlie Counts, NP      . ondansetron (ZOFRAN) injection 4 mg  4 mg Intravenous Q6H PRN Opyd, Ilene Qua, MD      . polyethylene glycol (MIRALAX / GLYCOLAX) packet 17 g  17 g Oral BID Vianne Bulls, MD   17 g at 03/15/17 0907  . risperiDONE (RISPERDAL) tablet 0.5 mg  0.5 mg Oral BID Opyd, Ilene Qua, MD   0.5 mg at 03/15/17 0907     Discharge Medications: Please see discharge summary for a list of discharge medications.  Relevant Imaging Results:  Relevant Lab Results:   Additional Information SSN: [id number]  Estanislado Emms, LCSW

## 2017-03-15 NOTE — Consult Note (Addendum)
Consultation Note Date: 03/15/2017   Patient Name: Raymond [name]  DOB: 02/11/1926  MRN: [id number]  Age / Sex: 84 y.o., male  PCP: Raylene Everts, MD Referring Physician: Cherene Altes, MD  Reason for Consultation: Establishing goals of care, Non pain symptom management, Pain control and Psychosocial/spiritual support  HPI/Patient Profile: 84 y.o. male  admitted on 02/18/2017 with s/p fall and chest pain. He has a past medical history of dementia with behavioral disturbance, coronary artery disease, hypothyroidism, chronic anemia and thrombocytopenia, and anxiety. Patient had reportedly been in his usual state of health and was having an uneventful day when he was noted by his caregiver to fall to the ground.  He complained of chest pain and was noted to be diaphoretic and pale.  EMS was called and patient was noted to be in atrial fibrillation with RVR, with incomplete LBBB on the monitor with marked ST depression in V1 through V3 concerning for posterior STEMI (also shown on EKG). Chest x-ray is notable for diffuse reticular opacity consistent with fibrosis, and patchy atelectasis or small infiltrates in the bases.  Noncontrast head CT is negative for acute intracranial abnormality. During this admission his RVR has continued to be difficult to control. He was deemed not to be a surgical candidate for STEMI. Palliative medicine consulted for "what is most appropriate disposition?-SNF v/s Hospice House".    Clinical Assessment and Goals of Care: I have reviewed medical records including MAR, lab results, and imaging,  assessed the patient and then met at the bedside along with Leonard Downing, NP (PMT) and significant other/HCPOA, [name], to discuss diagnosis prognosis, GOC, EOL wishes, disposition and options. Mr. Bon was resting in bed, and appears to be uncomfortable. He is unable to interact or follow  commands. He is nonverbal. His heart rate was 130-150's and irregular. He was tachypneic with occasional moaning. He was also reaching for things that were not there.   I introduced Palliative Medicine as specialized medical care for people living with serious illness. It focuses on providing relief from the symptoms and stress of a serious illness. The goal is to improve quality of life for both the patient and the family.  We discussed a brief life review of the patient. [name] stated that Mr. Riendeau retired from [employer] were he worked with Building services engineer in [place]. He has a son Corday, Wyka) and a daughter (deceased) from a previous marriage. Ms. [name] and Mr. Krikorian have been partners since 1985, but never married being they both had previous marriages. She reports their are some altered dynamics between her and the son in regards to her relationship and the care of Mr. Nee. Ms. [name] states she has cared for him over the past 2 years 24/7 since his decline in health. He loved to travel, and they would often go places together.   As far as functional and nutritional status the family reports noticing a significant decline in Mr. Aston's health over the past year with a more significant decline  over the past month. She reports worsening dementia and sundowning, increase in falls, decrease in nutritional intake, and multiple admissions related to health concerns. She reports being told by his PCP that he should be placed in a SNF due to his decline and last year he spent 3 weeks at [facility] facility for rehab. Over the past month his health has continued to decline and he has been unable to ambulate independently and his dementia has become more progressive. The son did not want him placed back into a facility and to support his wishes they hired 24/7 CNA care with [facility]. She reported Mr. Santiesteban often refused their care and would yell and ask them to leave, especially during the  evening and night time. She reports being unable to care for him in the home by herself. She also reports noticing weight loss. Prior to admission Mr. Abila would often not recognize her and call her his "mother."  We discussed his current illness and what it means in the larger context of their on-going co-morbidities.  Natural disease trajectory and expectations at EOL were discussed. She verbalized that she was not aware that he was at the point of EOL and was confused as to if he would recover or not.   I attempted to elicit values and goals of care important to the patient.  Ms. [name] reported she and Mr. Nadeau had only made burial plans but had never actually discussed any EOL preparations or wishes.   The difference between aggressive medical intervention and comfort care was considered in light of the patient's goals of care. We explained his current conditions and what aggressive care vs. Comfort care would look like such as managing symptoms (pain, shortness of breath, comfort feeds, nausea, vomiting, etc), caring for his holistic needs during his EOL transition, and supporting the family during this time. During this conversation Ms. [name] seemed unaware and verbalized being confused with the discussion and making such important decisions. She verbalized at this point she felt the son should also be involved in the conversation prior to any decisions being made. I have called the son and left a message to arrange for a family [place] meeting hopefully on tomorrow and further discuss their needs and disposition options. The son is a chemotherapy pharmacist at [employer].    Questions and concerns were addressed. The family was encouraged to call with questions or concerns.  PMT will continue to support holistically and plan to meet tomorrow for family [place] meeting with significant other and son.    Primary Decision Maker: -[name], Significant other (HCPOA) -Lawernce Keas., Son [facility]) Please  note they are BOTH listed on patient's Advanced Directives as HCPOA    SUMMARY OF RECOMMENDATIONS    DNR/DNI  Palliative will plan for f/u family goals of care meeting tomorrow when son is available to meet with team and significant other  Continue to treat the treatable as per attending providers  Will add Roxanol 3m SL every hour PRN for severe pain and shortness of breath (RR>25) in addition to IV morphine  Agree with Haldol/Ativan PRN for agitation and anxiety   Agree with Miralax for bowel support   Continue treating the treatable per medical team as requested by family  **If family were to choose comfort measures, then we could discuss residential hospice placement, however, GOC need to be clarified first   Code Status/Advance Care Planning:  DNR   Palliative Prophylaxis:   Aspiration, Bowel Regimen, Delirium Protocol and Frequent  Pain Assessment  Additional Recommendations (Limitations, Scope, Preferences):  Full Scope Treatment-continue to treat the treatable. Pending future care decisions after [place] meeting completed with family on 03/16/17.  Psycho-social/Spiritual:   Desire for further Chaplaincy support:No  Additional Recommendations: Education on Hospice  Prognosis:   Unable to determine-Guarded to poor given acute MI and other co morbidities (advanced dementia, decrease po intake, immobility, EF 40-45%, A-fib w/ RVR)   Discharge Planning: To Be Determined-pending [place] meeting with family on 03/16/17     Primary Diagnoses: Present on Admission: . CKD (chronic kidney disease) stage 2, GFR 60-89 ml/min . Dementia . Essential hypertension . Hypothyroidism . ASCVD (arteriosclerotic cardiovascular disease) . Acute MI ([place]) . Unspecified atrial fibrillation ([place]) . Sepsis, unspecified organism ([place])   I have reviewed the medical record, interviewed the patient and family, and examined the patient. The following aspects are pertinent.  Past Medical  History:  Diagnosis Date  . Anemia   . Anxiety   . ASCVD (arteriosclerotic cardiovascular disease)    2 MI's in 1990s  . Cataract   . Chronic renal insufficiency, stage II (mild)    CrCl in the 60s  . Dementia   . Depression   . DJD (degenerative joint disease)   . Esophageal dysphagia    Secondary to Schatzki's ring with dilation x2, most recently in 06/09, negative for H. pylori  . Gallstones   . GERD (gastroesophageal reflux disease)   . HTN (hypertension)    BP meds stopped 2016 due to recurrent orthostatic hypotension  . Hyperlipidemia   . Hypothyroidism   . Mild cognitive impairment with memory loss   . Myocardial infarction [facility]) [phone]   Cardiac cath done but no other intervention that we know of  . Orthostatic hypotension   . Psoriasis   . Thrombocytopenia ([place])    Platelets 99K on 09/2009 labs  . Ulcerative proctitis ([place])    Dx: 2000, started Asacol at that time.  Was taken off asacol 2014 and has done fine since (Dr. Sydell Axon, [facility])   Social History   Socioeconomic History  . Marital status: Widowed    Spouse name: None  . Number of children: 2  . Years of education: None  . Highest education level: None  Social Needs  . Financial resource strain: None  . Food insecurity - worry: None  . Food insecurity - inability: None  . Transportation needs - medical: None  . Transportation needs - non-medical: None  Occupational History  . Occupation: Retired from [employer]  . Smoking status: Former Smoker    Packs/day: 1.00    Years: 10.00    Pack years: 10.00    Types: Cigarettes    Last attempt to quit: 01/11/1970    Years since quitting: 47.2  . Smokeless tobacco: Never Used  . Tobacco comment: Minimal use at 20  Substance and Sexual Activity  . Alcohol use: No  . Drug use: No  . Sexual activity: Not Currently  Other Topics Concern  . None  Social History Narrative   Widowed, has had girlfriend x 72 yrs, Lives in [place],     [place] one son who lives in [place].   Occupation: [employer] in [place].  Retired 5s.   No regular exercise.  Smoked in the WAY distant past.   Alcohol: none.     Girlfriend [name] "[name]" is living with him as primary caregiver and is POA   Family History  Problem Relation Age of Onset  .  Pneumonia Mother   . Depression Mother   . Cancer Father        pt doesn't remember type  . Heart disease Daughter        heart defect: d age 1   Scheduled Meds: . amiodarone  200 mg Oral BID  . aspirin  81 mg Oral Daily  . atorvastatin  10 mg Oral q1800  . clopidogrel  75 mg Oral Daily  . digoxin  0.0625 mg Oral Daily  . diltiazem  30 mg Oral Q6H  . enoxaparin (LOVENOX) injection  40 mg Subcutaneous Q24H  . feeding supplement (ENSURE ENLIVE)  237 mL Oral BID BM  . levothyroxine  100 mcg Oral QAC breakfast  . mouth rinse  15 mL Mouth Rinse BID  . metoprolol tartrate  37.5 mg Oral Q8H  . polyethylene glycol  17 g Oral BID  . risperiDONE  0.5 mg Oral BID   Continuous Infusions: PRN Meds:.acetaminophen, haloperidol lactate, LORazepam, metoprolol tartrate, morphine injection, morphine CONCENTRATE, ondansetron (ZOFRAN) IV Medications Prior to Admission:  Prior to Admission medications   Medication Sig Start Date End Date Taking? Authorizing Provider  aspirin 81 MG tablet Take 81 mg by mouth daily.     Yes [provider]  atorvastatin (LIPITOR) 20 MG tablet TAKE ONE-HALF TABLET BY MOUTH ONCE DAILY 07/18/14  Yes McGowen, Adrian Blackwater, MD  levothyroxine (SYNTHROID, LEVOTHROID) 100 MCG tablet TAKE 1 TABLET BY MOUTH ONCE DAILY BEFORE BREAKFAST 02/01/17  Yes Raylene Everts, MD  niacin 500 MG CR capsule Take 500 mg by mouth every evening. 08/23/13  Yes [provider]  nitroGLYCERIN (NITROSTAT) 0.4 MG SL tablet Place 1 tablet (0.4 mg total) under the tongue every 5 (five) minutes as needed for chest pain. 06/26/14  Yes McGowen, Adrian Blackwater, MD  pantoprazole (PROTONIX) 40 MG tablet Take 40 mg  by mouth daily.   Yes [provider]  risperiDONE (RISPERDAL) 0.5 MG tablet Take 1 tablet (0.5 mg total) by mouth 2 (two) times daily. 11/09/16  Yes Raylene Everts, MD  pantoprazole (PROTONIX) 40 MG tablet TAKE ONE TABLET BY MOUTH ONCE DAILY BEFORE BREAKFAST 03/10/17   Carlis Stable, NP   No Known Allergies Review of Systems  Unable to perform ROS: Patient nonverbal    Physical Exam  Constitutional: He appears lethargic. He appears ill.  Cardiovascular: Normal pulses. An irregular rhythm present. Tachycardia present.  A-fib  Pulmonary/Chest: Accessory muscle usage present. Tachypnea noted. He has decreased breath sounds.  Abdominal: Soft. Bowel sounds are normal.  Musculoskeletal:  Generalized weakness   Neurological: He appears lethargic.  Psychiatric: Cognition and memory are impaired.  Dementia, reaching for things that are not there     Vital Signs: BP 108/76   Pulse (!) 144   Temp 98.2 F (36.8 C) (Oral)   Resp (!) 30   Ht 5' 8"  (1.727 m)   Wt 58.7 kg (129 lb 6.6 oz)   SpO2 96%   BMI 19.68 kg/m  Pain Assessment: No/denies pain   Pain Score: 0-No pain   SpO2: SpO2: 96 % O2 Device:SpO2: 96 % O2 Flow Rate: .O2 Flow Rate (L/min): 2 L/min  IO: Intake/output summary:   Intake/Output Summary (Last 24 hours) at 03/15/2017 1407 Last data filed at 03/15/2017 0859 Gross per 24 hour  Intake 180 ml  Output 300 ml  Net -120 ml    LBM: Last BM Date: 03/14/17 Baseline Weight: Weight: 60.3 kg (133 lb) Most recent weight: Weight: 58.7 kg (  129 lb 6.6 oz)     Palliative Assessment/Data: PPS 20%   Time In: 1300 Time Out: 1430 Time Total: 90 min   Greater than 50%  of this time was spent counseling and coordinating care related to the above assessment and plan.  Signed by: Alda Lea, AGNP-C Palliative Medicine Team  Phone: [phone] Fax: [phone]  Mariana Kaufman, AGNP-C Palliative Medicine     Please contact Palliative Medicine Team  phone at [phone] for questions and concerns.  For individual provider: See Shea Evans

## 2017-03-15 NOTE — Progress Notes (Signed)
[place] TEAM 1 - Stepdown/ICU TEAM  Raymond [name]  GEX:[id number] DOB: 1926/03/16 DOA: 02/15/2017 PCP: Raylene Everts, MD    Brief Narrative:  84 y.o. male with a history of severe dementia with behavioral disturbance, CAD, hypothyroidism, chronic anemia and thrombocytopenia, and anxiety who presented to the ED w/ chest pain after a fall to the ground.  He was noted to be diaphoretic and pale.  EMS was called and patient was noted to be in atrial fibrillation with RVR with marked ST depression in V1 through V3 concerning for posterior STEMI.  Upon arrival to the ED the patient was found to be hypothermic with temperature 35.4 C, tachycardic in the 120s, with blood pressure of 72/58.  EKG features atrial fibrillation with incomplete LBBB and marked ST depression in V1 through V3.  Chest x-ray is notable for diffuse reticular opacity consistent with fibrosis, and patchy atelectasis or small infiltrates in the bases.  CT head was negative for acute intracranial abnormality.  Lactic acid was elevated to 3.61, and troponin was undetectable.  Cardiology was consulted and the pt was treated with rectal aspirin and a heparin infusion.  The family declined coronary intervention.  Subjective: The pt is awake, and appears to be comfortable, w/o evidence of resp distress or uncontrolled pain.    Assessment & Plan:  Acute MI Not a candidate for intervention - Cards has been following - EF 40-45% via TTE w/ akinesis of the basal-mid-inferolateral, inferior, and infero-septal myocardium c/w RCA MI - appears to be stabilizing from a medical standpoint - weaning O2 as able, w/ pt only requiring 1L  at this time    Ischemic Systolic CHF (EF 01-02%) and Grade 2 Diastolic CHF  No signif volume overload in setting of extremely limited intake  Filed Weights   03/12/17 0434 03/14/17 0352 03/15/17 0344  Weight: 61.1 kg (134 lb 11.2 oz) 60.4 kg (133 lb 2.5 oz) 58.7 kg (129 lb 6.6 oz)    Newly diagnosed  Afib w/ RVR Rate proving difficult to control - is not a candidate for long term anticoag given high fall risk and severe cognitive decline - changed lovenox to DVT prophy dose only 3/2 - digoxin being dosed by Cards   SIRS  SIRS due to stress of AMI - no clincal findings suggestive of infection   Moderate malnutrition in context of chronic illness Due to severe progressive dementia and apparent anorexia   Hypothyroidism Cont home synthroid dose  Advanced dementia > terminal dementia Signif other relates a course of signif functional decline over last year, to the point that pt was fully dependent on others for care - we discussed the fact that his acute illness will likely accelerate this cognitive decline, and that he will not likely recover even to his prior level of fxn - I have advised her that he will very likely not be able to be cared for at home - we have also agreed that a feeding tube in this situation is not appropriate, and that we will accept whatever nutrition he is able to consume voluntarily   Anemia   Thrombocytopenia  Goals of care  Spoke w/ his POA/SO (who is not his son) at bedside - discussed focusing on controlling his anxiety and discomfort, and the need to have him placed in a facility where 24hr support will be available - unclear to me at this time if SNF or a residential hospice house would be most appropriate - asked Palliative Care to assist  in dispo planning    DVT prophylaxis: lovenox  Code Status: DNR - NO CODE Family Communication: no family present today  Disposition Plan: pending - family meeting for 03/16/17 per [facility]  Consultants:  Cardiology   Procedures: none  Antimicrobials:  none   Objective: Blood pressure 103/75, pulse (!) 144, temperature 98 F (36.7 C), temperature source Axillary, resp. rate (!) 30, height 5\' 8"  (1.727 m), weight 58.7 kg (129 lb 6.6 oz), SpO2 96 %.  Intake/Output Summary (Last 24 hours) at 03/15/2017 1631 Last data  filed at 03/15/2017 1300 Gross per 24 hour  Intake 210 ml  Output 300 ml  Net -90 ml   Filed Weights   03/12/17 0434 03/14/17 0352 03/15/17 0344  Weight: 61.1 kg (134 lb 11.2 oz) 60.4 kg (133 lb 2.5 oz) 58.7 kg (129 lb 6.6 oz)    Examination: General: No acute respiratory distress - alert  Lungs: CTA B - no wheezing  Cardiovascular: tachycardic - irreg irreg  Abdomen: NT/ND, soft, BS+ Extremities: No edema B LE    CBC: Recent Labs  Lab 02/11/2017 2218  03/09/17 0221 03/10/17 0347 03/11/17 0431 03/12/17 0300 03/15/17 0419  WBC 9.7  --  9.7 10.4 8.9 8.4 11.1*  NEUTROABS 7.6  --   --   --   --   --   --   HGB 11.1*   < > 10.7* 11.1* 11.0* 10.9* 11.4*  HCT 33.4*   < > 32.1* 33.4* 33.5* 33.4* 35.5*  MCV 96.3  --  97.0 94.4 95.7 94.4 97.8  PLT 108*  --  114* 125* 109* 113* 129*   < > = values in this interval not displayed.   Basic Metabolic Panel: Recent Labs  Lab 02/16/2017 2218 03/06/2017 2238 03/10/17 0347 03/10/17 1356 03/11/17 0431 03/12/17 0300 03/15/17 0419  NA 138 141 141  --  143 142 152*  K 4.0 4.1 3.3* 4.0 4.1 3.7 3.2*  CL 107 105 103  --  106 106 115*  CO2 21*  --  26  --  26 25 26   GLUCOSE 153* 148* 125*  --  142* 149* 137*  BUN 13 14 20   --  24* 21* 31*  CREATININE 1.17 1.10 1.32*  --  1.14 1.04 1.25*  CALCIUM 8.4*  --  8.4*  --  9.0 8.7* 8.9  MG  --   --   --  1.7 1.7 1.8 2.2   GFR: Estimated Creatinine Clearance: 33.9 mL/min (A) (by C-G formula based on SCr of 1.25 mg/dL (H)).  Liver Function Tests: Recent Labs  Lab 03/09/2017 2218  AST 39  ALT 23  ALKPHOS 84  BILITOT 0.4  PROT 6.1*  ALBUMIN 3.3*    Coagulation Profile: Recent Labs  Lab 02/19/2017 2218  INR 1.18    Cardiac Enzymes: Recent Labs  Lab 03/09/17 0718 03/09/17 1300 03/10/17 0347 03/11/17 0431 03/12/17 0300  TROPONINI 13.08* 40.84* >65.00* 34.86* 21.86*    Scheduled Meds: . amiodarone  200 mg Oral BID  . aspirin  81 mg Oral Daily  . atorvastatin  10 mg Oral q1800  .  clopidogrel  75 mg Oral Daily  . digoxin  0.0625 mg Oral Daily  . diltiazem  30 mg Oral Q6H  . enoxaparin (LOVENOX) injection  40 mg Subcutaneous Q24H  . feeding supplement (ENSURE ENLIVE)  237 mL Oral BID BM  . levothyroxine  100 mcg Oral QAC breakfast  . mouth rinse  15 mL Mouth Rinse BID  .  metoprolol tartrate  37.5 mg Oral Q8H  . polyethylene glycol  17 g Oral BID  . risperiDONE  0.5 mg Oral BID    LOS: 6 days   Cherene Altes, MD [facility] Office  [phone] Pager - Text Page per Amion as per below:  On-Call/Text Page:      [email or web address]      password [email or web address]  If 7PM-7AM, please contact night-coverage [email or web address] Password [email or web address] 03/15/2017, 4:31 PM

## 2017-03-15 NOTE — Plan of Care (Signed)
  Skin Integrity: Risk for impaired skin integrity will decrease 03/15/2017 2332 - Progressing by Theora Gianotti, RN   Pt repositioned every two hours, condom catheter implemented to maintain skin integrity.

## 2017-03-15 NOTE — Progress Notes (Addendum)
Progress Note  Patient Name: Raymond [name] Date of Encounter: 03/15/2017  Primary Cardiologist: Jenkins Rouge, MD  Subjective   Laying in bed, does not follow commands.   Inpatient Medications    Scheduled Meds: . amiodarone  200 mg Oral BID  . aspirin  81 mg Oral Daily  . atorvastatin  10 mg Oral q1800  . clopidogrel  75 mg Oral Daily  . diltiazem  30 mg Oral Q6H  . enoxaparin (LOVENOX) injection  40 mg Subcutaneous Q24H  . feeding supplement (ENSURE ENLIVE)  237 mL Oral BID BM  . levothyroxine  100 mcg Oral QAC breakfast  . mouth rinse  15 mL Mouth Rinse BID  . metoprolol tartrate  37.5 mg Oral Q8H  . polyethylene glycol  17 g Oral BID  . risperiDONE  0.5 mg Oral BID   Continuous Infusions:  PRN Meds: acetaminophen, haloperidol lactate, LORazepam, metoprolol tartrate, morphine injection, ondansetron (ZOFRAN) IV   Vital Signs    Vitals:   03/15/17 0005 03/15/17 0344 03/15/17 0552 03/15/17 0726  BP: 108/86 111/68 108/76   Pulse:  60 (!) 144   Resp:  (!) 23  (!) 30  Temp:  98.2 F (36.8 C)    TempSrc:  Oral    SpO2:  99%  96%  Weight:  129 lb 6.6 oz (58.7 kg)    Height:        Intake/Output Summary (Last 24 hours) at 03/15/2017 1116 Last data filed at 03/15/2017 0859 Gross per 24 hour  Intake 240 ml  Output 550 ml  Net -310 ml   Filed Weights   03/12/17 0434 03/14/17 0352 03/15/17 0344  Weight: 134 lb 11.2 oz (61.1 kg) 133 lb 2.5 oz (60.4 kg) 129 lb 6.6 oz (58.7 kg)    Telemetry    Afib Rates (120) - Personally Reviewed  ECG    N/a  - Personally Reviewed  Physical Exam   General: thin frail older W male appearing in no acute distress. Head: Normocephalic, atraumatic.  Neck: Supple, no JVD. Lungs:  Resp regular and unlabored, CTA. Heart: Tachy irreg, S1, S2, no S3, S4, or murmur; no rub. Abdomen: Soft, non-tender, non-distended with normoactive bowel sounds. Extremities: No clubbing, cyanosis, edema. Distal pedal pulses are 2+  bilaterally. Neuro: Disoriented. Moves all extremities spontaneously.  Labs    Chemistry Recent Labs  Lab 02/13/2017 2218  03/11/17 0431 03/12/17 0300 03/15/17 0419  NA 138   < > 143 142 152*  K 4.0   < > 4.1 3.7 3.2*  CL 107   < > 106 106 115*  CO2 21*   < > 26 25 26   GLUCOSE 153*   < > 142* 149* 137*  BUN 13   < > 24* 21* 31*  CREATININE 1.17   < > 1.14 1.04 1.25*  CALCIUM 8.4*   < > 9.0 8.7* 8.9  PROT 6.1*  --   --   --   --   ALBUMIN 3.3*  --   --   --   --   AST 39  --   --   --   --   ALT 23  --   --   --   --   ALKPHOS 84  --   --   --   --   BILITOT 0.4  --   --   --   --   GFRNONAA 54*   < > 55* >60 50*  GFRAA >60   < > >  60 >60 57*  ANIONGAP 10   < > 11 11 11    < > = values in this interval not displayed.     Hematology Recent Labs  Lab 03/11/17 0431 03/12/17 0300 03/15/17 0419  WBC 8.9 8.4 11.1*  RBC 3.50*  3.50* 3.54* 3.63*  HGB 11.0* 10.9* 11.4*  HCT 33.5* 33.4* 35.5*  MCV 95.7 94.4 97.8  MCH 31.4 30.8 31.4  MCHC 32.8 32.6 32.1  RDW 14.1 13.9 14.3  PLT 109* 113* 129*    Cardiac Enzymes Recent Labs  Lab 03/09/17 1300 03/10/17 0347 03/11/17 0431 03/12/17 0300  TROPONINI 40.84* >65.00* 34.86* 21.86*    Recent Labs  Lab 03/01/2017 2237  TROPIPOC 0.00     BNP Recent Labs  Lab 02/18/2017 2218  BNP 64.0     DDimer No results for input(s): DDIMER in the last 168 hours.    Radiology    No results found.  Cardiac Studies   TTE: 03/10/17  Study Conclusions  - Left ventricle: The cavity size was normal. Wall thickness was normal. Systolic function was mildly to moderately reduced. The estimated ejection fraction was in the range of 40% to 45%. Akinesis and scarring of the basal-midinferolateral, inferior, and inferoseptal myocardium; consistent with infarction in the distribution of the right coronary artery. Features are consistent with a pseudonormal left ventricular filling pattern, with concomitant abnormal  relaxation and increased filling pressure (grade 2 diastolic dysfunction). - Aortic valve: Right coronary cusp mobility was severely restricted. There was mild to moderate stenosis. There was moderate regurgitation directed centrally in the LVOT. Valve area (VTI): 1.37 cm^2. Valve area (Vmax): 1.18 cm^2. Valve area (Vmean): 1.17 cm^2. - Mitral valve: Mildly to moderately calcified annulus. There was mild to moderate regurgitation directed centrally.  Patient Profile     84 y.o. male with acute posterior MI (planned for medical therapy given advanced dementia), hypothyroidism, anemia, thrombocytopenia who presented with altered mental status and chest pain.   Assessment & Plan    1. MI: Planned to continue medical therapy for this given his advanced dementia. Medications are being added as tolerated.  -- Plavix 75mg  daily added yesterday without a load given his thrombocytopenia. Platelets stable today.  2. Afib RVR: rates still elevated in the 120s but improved from yesterday. Will continue with metoprolol to 37.5mg  TID for now as blood pressure was in the 48G systolic this am. Not a candidate for [facility].  -- discussed with Dr. Claiborne Billings and will add dig 0.0625 daily.   3. Dementia: Unable to obtain any history from the patient. CSW was at the bedside today to discuss options with wife at the bedside for placement following hospitalization.   4. Thrombocytopenia: 109>>113>>129 stable with the addition of plavix.   Signed, Reino Bellis, NP  03/15/2017, 11:16 AM  Pager # [phone]   For questions or updates, please contact [place] Please consult [email or web address] for contact info under Cardiology/STEMI.   Patient seen and examined. Agree with assessment and plan. AF with HR slightly improved predominantly in the 120's.   Currently on amiodarone 200 mg bid, metoprolol 37.5 mg q 8 hrs, diltiazem 30 mg q 6 hrs; will add digoxin 0.0625 mg for additional rate control since BP is  low and limiting further titration of metoprolol or diltiazem.    Troy Sine, MD, [facility] 03/15/2017 12:05 PM

## 2017-03-16 DIAGNOSIS — Z515 Encounter for palliative care: Secondary | ICD-10-CM

## 2017-03-16 DIAGNOSIS — Z7189 Other specified counseling: Secondary | ICD-10-CM

## 2017-03-16 LAB — BASIC METABOLIC PANEL
ANION GAP: 10 (ref 5–15)
BUN: 31 mg/dL — AB (ref 6–20)
CHLORIDE: 120 mmol/L — AB (ref 101–111)
CO2: 25 mmol/L (ref 22–32)
Calcium: 8.4 mg/dL — ABNORMAL LOW (ref 8.9–10.3)
Creatinine, Ser: 1.32 mg/dL — ABNORMAL HIGH (ref 0.61–1.24)
GFR calc Af Amer: 54 mL/min — ABNORMAL LOW (ref >60)
GFR, EST NON AFRICAN AMERICAN: 46 mL/min — AB (ref >60)
GLUCOSE: 148 mg/dL — AB (ref 65–99)
POTASSIUM: 3.7 mmol/L (ref 3.5–5.1)
Sodium: 155 mmol/L — ABNORMAL HIGH (ref 135–145)

## 2017-03-16 MED ORDER — SODIUM CHLORIDE 0.9 % IV SOLN
1.0000 mg/h | INTRAVENOUS | Status: DC
  Administered 2017-03-16: 1 mg/h via INTRAVENOUS
  Filled 2017-03-16: qty 10

## 2017-03-16 MED ORDER — GLYCOPYRROLATE 0.2 MG/ML IJ SOLN: 0.2000 mg | INTRAMUSCULAR | Status: DC | PRN

## 2017-03-16 MED ORDER — HALOPERIDOL 0.5 MG PO TABS
0.5000 mg | ORAL_TABLET | ORAL | Status: DC | PRN
  Filled 2017-03-16: qty 1

## 2017-03-16 MED ORDER — GLYCOPYRROLATE 1 MG PO TABS: 1.0000 mg | ORAL_TABLET | ORAL | Status: DC | PRN

## 2017-03-16 MED ORDER — HALOPERIDOL LACTATE 5 MG/ML IJ SOLN: 0.5000 mg | INTRAMUSCULAR | Status: DC | PRN

## 2017-03-16 MED ORDER — DEXTROSE 5 % IV SOLN
INTRAVENOUS | Status: DC
  Administered 2017-03-16: 10:00:00 via INTRAVENOUS

## 2017-03-16 MED ORDER — HALOPERIDOL LACTATE 2 MG/ML PO CONC
0.5000 mg | ORAL | Status: DC | PRN
  Filled 2017-03-16: qty 0.3

## 2017-03-16 MED ORDER — MORPHINE BOLUS VIA INFUSION
2.0000 mg | INTRAVENOUS | Status: DC | PRN
  Administered 2017-03-17 (×5): 2 mg via INTRAVENOUS
  Filled 2017-03-16: qty 2

## 2017-03-16 MED ORDER — MORPHINE 100MG IN NS 100ML (1MG/ML) PREMIX INFUSION
1.0000 mg/h | INTRAVENOUS | Status: DC
  Filled 2017-03-16: qty 100

## 2017-03-16 MED ORDER — ACETAMINOPHEN 650 MG RE SUPP: 650.0000 mg | RECTAL | Status: DC | PRN

## 2017-03-16 NOTE — Progress Notes (Signed)
PROGRESS NOTE    Raymond [name]  WNU:[id number] DOB: 09/15/1926 DOA: 02/19/2017 PCP: Raylene Everts, MD   Brief Narrative:  84 y.o. WM PMHx   severe dementia with behavioral disturbance, CAD, hypothyroidism, chronic anemia and thrombocytopenia, and anxiety   Presented to the ED w/ chest pain after a fall to the ground.  He was noted to be diaphoretic and pale.  EMS was called and patient was noted to be in atrial fibrillation with RVR with marked ST depression in V1 through V3 concerning for posterior STEMI.   Upon arrival to the ED the patient was found to be hypothermic with temperature 35.4 C, tachycardic in the 120s, with blood pressure of 72/58.  EKG features atrial fibrillation with incomplete LBBB and marked ST depression in V1 through V3.  Chest x-ray is notable for diffuse reticular opacity consistent with fibrosis, and patchy atelectasis or small infiltrates in the bases.  CT head was negative for acute intracranial abnormality.  Lactic acid was elevated to 3.61, and troponin was undetectable.  Cardiology was consulted and the pt was treated with rectal aspirin and a heparin infusion.  The family declined coronary intervention.    Subjective: 3/6 A/Ox 0. Appears to have mild discomfort. Now comfort care per family      Assessment & Plan:   Principal Problem:   Acute MI [facility]) Active Problems:   Hypothyroidism   Essential hypertension   Dementia   CKD (chronic kidney disease) stage 2, GFR 60-89 ml/min   ASCVD (arteriosclerotic cardiovascular disease)   Unspecified atrial fibrillation (HCC)   Sepsis, unspecified organism (HCC)   Pressure injury of skin   Malnutrition of moderate degree  Acute STEMI/ Acute Systolic and Diastolic CHF  -Family has chosen conservative management. Per EMR informed cardiology does not want intervention. -Per EMR unlikely patient to survive hospitalization. Medical care as per wishes of his son at the bedside (default POA - no other children  - wife deceased) -Echocardiogram: Consistent with systolic and diastolic CHF see results below.no previous echocardiogram for comparison. -Strict in and out since admission -2.8 L -Daily weight Filed Weights   03/12/17 0434 03/14/17 0352 03/15/17 0344  Weight: 134 lb 11.2 oz (61.1 kg) 133 lb 2.5 oz (60.4 kg) 129 lb 6.6 oz (58.7 kg)  -Transfuse for hemoglobin<8 -3/6 now comfort care   Newly diagnosed A. fib with RVR -Irregular irregular rhythm and rate -3/6 now comfort care  SIRS  -On admission meets criteria for SIRS , however no clinical indication of infection. -most likely secondary to stress of acute NSTEMI. Antibiotics held  -3/6 now comfort care   Hypothyroidism -3/6 now comfort care.   Advanced dementia/AMS -Unable to evaluate secondary to altered mental status    Anemia of chronic disease  -Most likely anemia of chronic disease -Anemia panel: Consistent with anemia of chronic disease. -3/6 now comfort care   Thrombocytopenia -most likely secondary to stress related to NSTEMI  -See anemia -no signs of acute bleed. -3/6 now comfort care  Hypokalemia  -3/6 now comfort care  Hypernatremia -3/6 now comfort care     Goals of care  -Spoke w/ son at bedside - plan is to continue conservative medical tx of his MI balanced w/ pain and anxiety meds to assure he is comfortable - if declines further will discuss transition to full comfort care only  -If no improvement in next 48 hours would consult palliative care. -3/6 now comfort care   DVT prophylaxis: Heparin drip Code Status: DO  NOT RESUSCITATE Family Communication: Family at bedside for discussion of plan of care Disposition Plan: TBD   Consultants:  Cardiology   Procedures/Significant Events:  2/28 Echocardiogram; Left ventricle: LVEF= 40% to 45%.-  Akinesis and scarring of the basal-midinferolateral, inferior,  and inferoseptal myocardium; consistent with infarction in the   distribution of the right  coronary artery. -Grade 2 diastolic dysfunction. - Aortic valve:moderate regurgitation  - Mitral valve: mild to moderate regurgitation     I have personally reviewed and interpreted all radiology studies and my findings are as above.  VENTILATOR SETTINGS:    Cultures   Antimicrobials: Anti-infectives (From admission, onward)   Start     Stop   03/09/17 2200  vancomycin (VANCOCIN) IVPB 1000 mg/200 mL premix  Status:  Discontinued     03/09/17 1207   03/09/17 0600  piperacillin-tazobactam (ZOSYN) IVPB 3.375 g  Status:  Discontinued     03/09/17 1207   03/09/17 0000  piperacillin-tazobactam (ZOSYN) IVPB 3.375 g     03/09/17 0210   03/09/17 0000  vancomycin (VANCOCIN) IVPB 1000 mg/200 mL premix     03/09/17 0205       Devices    LINES / TUBES:      Continuous Infusions:    Objective: Vitals:   03/15/17 2106 03/16/17 0016 03/16/17 0256 03/16/17 0510  BP:  96/69 106/85   Pulse: (!) 131  77 (!) 136  Resp:   (!) 29   Temp:      TempSrc:      SpO2:   93%   Weight:      Height:        Intake/Output Summary (Last 24 hours) at 03/16/2017 0814 Last data filed at 03/16/2017 0257 Gross per 24 hour  Intake 150 ml  Output 150 ml  Net 0 ml   Filed Weights   03/12/17 0434 03/14/17 0352 03/15/17 0344  Weight: 134 lb 11.2 oz (61.1 kg) 133 lb 2.5 oz (60.4 kg) 129 lb 6.6 oz (58.7 kg)    Physical Exam:  General: A/O 0, No acute respiratory distress Neck:  Negative scars, masses, torticollis, lymphadenopathy, JVD Lungs: Clear to auscultation bilaterally without wheezes or crackles Cardiovascular: Irregular irregular rhythm and rate, without murmur gallop or rub normal S1 and S2 Abdomen: negative abdominal pain, nondistended, positive soft, bowel sounds, no rebound, no ascites, no appreciable mass Extremities: No significant cyanosis, clubbing, or edema bilateral lower extremities Skin: Negative rashes, lesions, ulcers Psychiatric:  Unable to evaluate secondary  somnolence Central nervous system:  Unable to evaluate secondary somnolence   .     Data Reviewed: Care during the described time interval was provided by me .  I have reviewed this patient's available data, including medical history, events of note, physical examination, and all test results as part of my evaluation.   CBC: Recent Labs  Lab 03/10/17 0347 03/11/17 0431 03/12/17 0300 03/15/17 0419  WBC 10.4 8.9 8.4 11.1*  HGB 11.1* 11.0* 10.9* 11.4*  HCT 33.4* 33.5* 33.4* 35.5*  MCV 94.4 95.7 94.4 97.8  PLT 125* 109* 113* 628*   Basic Metabolic Panel: Recent Labs  Lab 03/10/17 0347 03/10/17 1356 03/11/17 0431 03/12/17 0300 03/15/17 0419  NA 141  --  143 142 152*  K 3.3* 4.0 4.1 3.7 3.2*  CL 103  --  106 106 115*  CO2 26  --  26 25 26   GLUCOSE 125*  --  142* 149* 137*  BUN 20  --  24* 21* 31*  CREATININE  1.32*  --  1.14 1.04 1.25*  CALCIUM 8.4*  --  9.0 8.7* 8.9  MG  --  1.7 1.7 1.8 2.2   GFR: Estimated Creatinine Clearance: 33.9 mL/min (A) (by C-G formula based on SCr of 1.25 mg/dL (H)). Liver Function Tests: No results for input(s): AST, ALT, ALKPHOS, BILITOT, PROT, ALBUMIN in the last 168 hours. No results for input(s): LIPASE, AMYLASE in the last 168 hours. No results for input(s): AMMONIA in the last 168 hours. Coagulation Profile: No results for input(s): INR, PROTIME in the last 168 hours. Cardiac Enzymes: Recent Labs  Lab 03/09/17 1300 03/10/17 0347 03/11/17 0431 03/12/17 0300  TROPONINI 40.84* >65.00* 34.86* 21.86*   BNP (last 3 results) No results for input(s): PROBNP in the last 8760 hours. HbA1C: No results for input(s): HGBA1C in the last 72 hours. CBG: No results for input(s): GLUCAP in the last 168 hours. Lipid Profile: No results for input(s): CHOL, HDL, LDLCALC, TRIG, CHOLHDL, LDLDIRECT in the last 72 hours. Thyroid Function Tests: No results for input(s): TSH, T4TOTAL, FREET4, T3FREE, THYROIDAB in the last 72 hours. Anemia Panel: No  results for input(s): VITAMINB12, FOLATE, FERRITIN, TIBC, IRON, RETICCTPCT in the last 72 hours. Urine analysis:    Component Value Date/Time   COLORURINE YELLOW 10/04/2016 [place] 10/04/2016 0952   LABSPEC 1.012 10/04/2016 0952   PHURINE 7.0 10/04/2016 0952   GLUCOSEU NEGATIVE 10/04/2016 0952   HGBUR NEGATIVE 10/04/2016 0952   KETONESUR NEGATIVE 10/04/2016 0952   PROTEINUR NEGATIVE 10/04/2016 0952   NITRITE NEGATIVE 10/04/2016 0952   LEUKOCYTESUR NEGATIVE 10/04/2016 0952   Sepsis Labs: @LABRCNTIP (procalcitonin:4,lacticidven:4)  ) Recent Results (from the past 240 hour(s))  Culture, blood (routine x 2)     Status: Abnormal   Collection Time: 02/14/2017 10:18 PM  Result Value Ref Range Status   Specimen Description BLOOD LEFT ANTECUBITAL  Final   Special Requests   Final    BOTTLES DRAWN AEROBIC AND ANAEROBIC Blood Culture adequate volume   Culture  Setup Time   Final    GRAM POSITIVE COCCI AEROBIC BOTTLE ONLY CRITICAL RESULT CALLED TO, READ BACK BY AND VERIFIED WITH: J LEDFORD PHARMD 03/10/17 0008 JDW    Culture (A)  Final    STAPHYLOCOCCUS SPECIES (COAGULASE NEGATIVE) THE SIGNIFICANCE OF ISOLATING THIS ORGANISM FROM A SINGLE SET OF BLOOD CULTURES WHEN MULTIPLE SETS ARE DRAWN IS UNCERTAIN. PLEASE NOTIFY THE MICROBIOLOGY DEPARTMENT WITHIN ONE WEEK IF SPECIATION AND SENSITIVITIES ARE REQUIRED. Performed at [facility], [place] [address], [place], [place] [address]    Report Status 03/11/2017 FINAL  Final  Blood Culture ID Panel (Reflexed)     Status: Abnormal   Collection Time: 03/02/2017 10:18 PM  Result Value Ref Range Status   Enterococcus species NOT DETECTED NOT DETECTED Final   Listeria monocytogenes NOT DETECTED NOT DETECTED Final   Staphylococcus species DETECTED (A) NOT DETECTED Final    Comment: Methicillin (oxacillin) susceptible coagulase negative staphylococcus. Possible blood culture contaminant (unless isolated from more than one blood culture  draw or clinical case suggests pathogenicity). No antibiotic treatment is indicated for blood  culture contaminants. CRITICAL RESULT CALLED TO, READ BACK BY AND VERIFIED WITH: J LEDFORD PHARMD 03/10/17 0008 JDW    Staphylococcus aureus NOT DETECTED NOT DETECTED Final   Methicillin resistance NOT DETECTED NOT DETECTED Final   Streptococcus species NOT DETECTED NOT DETECTED Final   Streptococcus agalactiae NOT DETECTED NOT DETECTED Final   Streptococcus pneumoniae NOT DETECTED NOT DETECTED Final  Streptococcus pyogenes NOT DETECTED NOT DETECTED Final   Acinetobacter baumannii NOT DETECTED NOT DETECTED Final   Enterobacteriaceae species NOT DETECTED NOT DETECTED Final   Enterobacter cloacae complex NOT DETECTED NOT DETECTED Final   Escherichia coli NOT DETECTED NOT DETECTED Final   Klebsiella oxytoca NOT DETECTED NOT DETECTED Final   Klebsiella pneumoniae NOT DETECTED NOT DETECTED Final   Proteus species NOT DETECTED NOT DETECTED Final   Serratia marcescens NOT DETECTED NOT DETECTED Final   Haemophilus influenzae NOT DETECTED NOT DETECTED Final   Neisseria meningitidis NOT DETECTED NOT DETECTED Final   Pseudomonas aeruginosa NOT DETECTED NOT DETECTED Final   Candida albicans NOT DETECTED NOT DETECTED Final   Candida glabrata NOT DETECTED NOT DETECTED Final   Candida krusei NOT DETECTED NOT DETECTED Final   Candida parapsilosis NOT DETECTED NOT DETECTED Final   Candida tropicalis NOT DETECTED NOT DETECTED Final    Comment: Performed at [facility], [place] [address], [place], [place] [address]  Culture, blood (routine x 2)     Status: None   Collection Time: 02/21/2017 11:42 PM  Result Value Ref Range Status   Specimen Description BLOOD LEFT HAND  Final   Special Requests IN PEDIATRIC BOTTLE Blood Culture adequate volume  Final   Culture   Final    NO GROWTH 5 DAYS Performed at [facility], [place] [address] [place], [place] [address]    Report Status 03/14/2017 FINAL  Final    MRSA PCR Screening     Status: None   Collection Time: 03/09/17  3:45 PM  Result Value Ref Range Status   MRSA by PCR NEGATIVE NEGATIVE Final    Comment:        The GeneXpert MRSA Assay (FDA approved for NASAL specimens only), is one component of a comprehensive MRSA colonization surveillance program. It is not intended to diagnose MRSA infection nor to guide or monitor treatment for MRSA infections. Performed at [facility], [place] [address] [place], [place] [address]          Radiology Studies: No results found.      Scheduled Meds: . amiodarone  200 mg Oral BID  . aspirin  81 mg Oral Daily  . atorvastatin  10 mg Oral q1800  . clopidogrel  75 mg Oral Daily  . digoxin  0.0625 mg Oral Daily  . diltiazem  30 mg Oral Q6H  . enoxaparin (LOVENOX) injection  40 mg Subcutaneous Q24H  . feeding supplement (ENSURE ENLIVE)  237 mL Oral BID BM  . levothyroxine  100 mcg Oral QAC breakfast  . mouth rinse  15 mL Mouth Rinse BID  . metoprolol tartrate  37.5 mg Oral Q8H  . polyethylene glycol  17 g Oral BID  . risperiDONE  0.5 mg Oral BID   Continuous Infusions:    LOS: 7 days    Time spent: 40 minutes    WOODS, Geraldo Docker, MD [facility] Pager [phone]   If 7PM-7AM, please contact night-coverage [email or web address] Password [email or web address] 03/16/2017, 8:14 AM

## 2017-03-16 NOTE — Progress Notes (Addendum)
Patient was assessed and I was not able to awaken him with sternal rubs.  I have placed Raymond [name] on a NRB mask due to desat levels keep dipping in the low 80s.  He is resting comfortably with no pain or distress.  I will keep monitoring patient.  Provided oral care.

## 2017-03-16 NOTE — Progress Notes (Signed)
Daily Progress Note   Patient Name: Raymond [name]       Date: 03/16/2017 DOB: September 23, 1926  Age: 84 y.o. MRN#: [id number] Attending Physician: Allie Bossier, MD Primary Care Physician: Raylene Everts, MD Admit Date: 03/09/2017  Reason for Consultation/Follow-up: Establishing goals of care, Non pain symptom management, Pain control and Psychosocial/spiritual support  Subjective: Raymond [name] in bed and appears uncomfortable. He is unable to verbalize pain or discomfort, however is exhibiting signs of discomfort.  He is is having tremors, right arm and leg is stiffened, patient is grimacing with occasional moaning. His HR tachycardic in the 120s and tachypneic (40s). Family (Raymond [name], significant other, Raymond [name], son and his wife) are all at the bedside.   Bedside RN made aware of patient's current status and PRN IV Morphine was given for comfort.    Patient was assessed, chart reviewed, and received report from bedside RN. Family is at bedside to discuss diagnosis prognosis, GOC, EOL wishes, disposition and options.  We discussed Raymond [name]'s current illness and what it means in the larger context of his on-going co-morbidities.  Natural disease trajectory and expectations at EOL were discussed.  I attempted to elicit values and goals of care important to the patient.  The difference between aggressive medical intervention and comfort care was explained and considered in light of the patient's goals of care. The family verbalized their awareness of Raymond [name] not making a recovery to his minimum baseline prior to admission. They verbalized their observation of him appearing uncomfortable and not wanting him to suffer. The family stated they feel that Raymond [name]'s quality of life is most important at this  time and wishes to proceed to comfort care allowing his care to focus on symptom management and a peaceful EOL transition. We discussed and family understands that transition to comfort care means giving medications and interventions only intended to provide comfort, stopping IV fluids and other life prolonging medications and interventions.  We discussed that based on patient's nonverbal cues he appears to be having pain or anxiety that he cannot communicate to [place]. Discussed starting continuous infusion of morphine, family verbalizes understanding and agrees.  In-home and residential Hospice services were explained. Family agrees to transition to comfort care and monitor over the next 24hrs and will make a decision based on how  Raymond [name] does over night to transition home and arrange for 24/7 care and in-home hospice vs residential hospice.   Questions and concerns were addressed. The family was encouraged to call with questions or concerns.  PMT will continue to support holistically and transition to comfort care at family's request.    Length of Stay: 7  Current Medications: Scheduled Meds:  . mouth rinse  15 mL Mouth Rinse BID  . risperiDONE  0.5 mg Oral BID    Continuous Infusions: . morphine      PRN Meds: acetaminophen, glycopyrrolate **OR** glycopyrrolate **OR** glycopyrrolate, haloperidol **OR** haloperidol **OR** haloperidol lactate, LORazepam, morphine, ondansetron (ZOFRAN) IV  Physical Exam  Constitutional: He appears ill.  Appears to be in some form of discomfort/pain.   Cardiovascular: Intact distal pulses. An irregular rhythm present.  Pulmonary/Chest: Accessory muscle usage present. Tachypnea noted.  Mouth breathing   Neurological: He is disoriented.  Advanced dementia   Psychiatric: Cognition and memory are impaired.            Vital Signs: BP 132/88 (BP Location: Left Arm)   Pulse 99   Temp 99.1 F (37.3 C) (Axillary)   Resp (!) 24   Ht 5\' 8"  (1.727 m)   Wt  58.7 kg (129 lb 6.6 oz)   SpO2 93%   BMI 19.68 kg/m  SpO2: SpO2: 93 % O2 Device: O2 Device: Nasal Cannula O2 Flow Rate: O2 Flow Rate (L/min): 4 L/min  Intake/output summary:   Intake/Output Summary (Last 24 hours) at 03/16/2017 1617 Last data filed at 03/16/2017 0900 Gross per 24 hour  Intake 0 ml  Output 150 ml  Net -150 ml   LBM: Last BM Date: 03/14/17 Baseline Weight: Weight: 60.3 kg (133 lb) Most recent weight: Weight: 58.7 kg (129 lb 6.6 oz)       Palliative Assessment/Data: PPS 20%   Flowsheet Rows     Most Recent Value  Intake Tab  Referral Department  Hospitalist  Unit at Time of Referral  Med/Surg Unit  Palliative Care Primary Diagnosis  Neurology  Date Notified  03/15/17  Palliative Care Type  New Palliative care  Reason for referral  Clarify Goals of Care, Non-pain Symptom, Pain  Date of Admission  02/15/2017  Date first seen by Palliative Care  03/15/17  # of days Palliative referral response time  0 Day(s)  # of days IP prior to Palliative referral  7  Clinical Assessment  Psychosocial & Spiritual Assessment  Palliative Care Outcomes      Patient Active Problem List   Diagnosis Date Noted  . Malnutrition of moderate degree 03/12/2017  . ASCVD (arteriosclerotic cardiovascular disease) 03/09/2017  . Unspecified atrial fibrillation ([place]) 03/09/2017  . Sepsis, unspecified organism ([place]) 03/09/2017  . Pressure injury of skin 03/09/2017  . Acute ST elevation myocardial infarction (STEMI) of posterior wall (HCC)   . Atrial fibrillation with RVR ([place])   . CKD (chronic kidney disease) stage 2, GFR 60-89 ml/min 06/18/2016  . Gait instability 06/14/2016  . Aortic valve calcification 04/07/2016  . History of myocardial infarction 04/07/2016  . GERD (gastroesophageal reflux disease) 10/04/2014  . Dementia 04/16/2013  . Hyperlipidemia 04/16/2013  . Esophageal dysphagia 06/23/2012  . Essential hypertension 04/08/2012  . Acute MI ([place])   . Hypothyroidism  10/15/2009  . Psoriasis 10/15/2009  . PAD (peripheral artery disease) ([place]) 09/11/2009  . Benign esophageal stricture 09/11/2009  . DIVERTICULAR DISEASE 09/11/2009    Palliative Care Assessment & Plan   Assessment:  Acute MI  A-fib RVR  Advanced Dementia   Thrombocytopenia   CKD stage 2  GERD  Hypothyroidism   Recommendations/Plan:  DNR/DNI  FULL COMFORT CARE per family request   Will initiate morphine 1mg /hr drip with bolus  Zofran IV PRN for nausea  Robinul IV PRN for secretions  Haldol/Ativan PRN for agitation/anxiety  Comfort feeds as desired   Nursing staff to provide mouth care  Will assess how patient responds to transition to full comfort care- I am concerned that he will not be stable for transfer once comfort care transition is made. Will reeval for disposition tomorrow.   Goals of Care and Additional Recommendations:  Limitations on Scope of Treatment: Full Comfort Care  Code Status:    Code Status Orders  (From admission, onward)        Start     Ordered   03/16/17 1607  Do not attempt resuscitation (DNR)  Continuous    Question Answer Comment  In the event of cardiac or respiratory ARREST Do not call a "code blue"   In the event of cardiac or respiratory ARREST Do not perform Intubation, CPR, defibrillation or ACLS   In the event of cardiac or respiratory ARREST Use medication by any route, position, wound care, and other measures to relive pain and suffering. May use oxygen, suction and manual treatment of airway obstruction as needed for comfort.      03/16/17 1613        Advance Directive Documentation     Most Recent Value  Type of Advance Directive  Healthcare Power of Attorney  Pre-existing out of facility DNR order (yellow form or pink MOST form)  No data  "MOST" Form in Place?  No data       Prognosis:   Hours - Days after transition to comfor care is made due to s/p MI with no surgical intervention, pt cont to have a  fib w/ RVR, runs of VTACH, minimal po intake  Discharge Planning:  Anticipated Hospital Death vs. Home with hospice vs residential hospice  Care plan was discussed with family, bedside RN, and text page to Dr. Sherral Hammers.   Thank you for allowing the Palliative Medicine Team to assist in the care of this patient.   Time In: 1500 Time Out: 1615 Total Time 75 min Prolonged Time Billed  Yes      Greater than 50%  of this time was spent counseling and coordinating care related to the above assessment and plan.  Alda Lea, AGNP-C Palliative Medicine Team  Phone: [phone] Fax: [phone]  Mariana Kaufman, AGNP-C Palliative Medicine   Please contact Palliative Medicine Team phone at [phone] for questions and concerns.

## 2017-03-17 DIAGNOSIS — Z515 Encounter for palliative care: Secondary | ICD-10-CM

## 2017-03-17 MED ORDER — ENSURE ENLIVE PO LIQD: 237.0000 mL | Freq: Two times a day (BID) | ORAL | Status: DC

## 2017-04-11 NOTE — Progress Notes (Addendum)
Placed patient back on [place], saturation levels are not being monitored at this time per MD orders.  Oral care still being provided.  Bolus of 2 mg of morphine will be given for air hunger episodes.  Patient is now full comfort care per family request and pallative consult.

## 2017-04-11 NOTE — Progress Notes (Signed)
PROGRESS NOTE    Raymond [name]  Raymond [name]:[id number] DOB: 05/29/1926 DOA: 02/18/2017 PCP: Raymond Everts, MD   Brief Narrative:  84 y.o. WM PMHx   severe dementia with behavioral disturbance, CAD, hypothyroidism, chronic anemia and thrombocytopenia, and anxiety   Presented to the ED w/ chest pain after a fall to the ground.  He was noted to be diaphoretic and pale.  EMS was called and patient was noted to be in atrial fibrillation with RVR with marked ST depression in V1 through V3 concerning for posterior STEMI.   Upon arrival to the ED the patient was found to be hypothermic with temperature 35.4 C, tachycardic in the 120s, with blood pressure of 72/58.  EKG features atrial fibrillation with incomplete LBBB and marked ST depression in V1 through V3.  Chest x-ray is notable for diffuse reticular opacity consistent with fibrosis, and patchy atelectasis or small infiltrates in the bases.  CT head was negative for acute intracranial abnormality.  Lactic acid was elevated to 3.61, and troponin was undetectable.  Cardiology was consulted and the pt was treated with rectal aspirin and a heparin infusion.  The family declined coronary intervention.    Subjective: 3/7 A/Ox 0. Appears comfortable.      Assessment & Plan:   Principal Problem:   Acute MI [facility]) Active Problems:   Hypothyroidism   Essential hypertension   Dementia   CKD (chronic kidney disease) stage 2, GFR 60-89 ml/min   ASCVD (arteriosclerotic cardiovascular disease)   Unspecified atrial fibrillation (HCC)   Sepsis, unspecified organism (HCC)   Pressure injury of skin   Malnutrition of moderate degree   Advanced care planning/counseling discussion   Goals of care, counseling/discussion   Palliative care by specialist   Terminal care  Acute STEMI/ Acute Systolic and Diastolic CHF  -Family has chosen conservative management. Per EMR informed cardiology does not want intervention. -Per EMR unlikely patient to survive  hospitalization. Medical care as per wishes of his son at the bedside (default POA - no other children - wife deceased) -Echocardiogram: Consistent with systolic and diastolic CHF see results below.no previous echocardiogram for comparison. -Strict in and out since admission -3.1 L -Daily weight Filed Weights   03/14/17 0352 03/15/17 0344 04/05/17 0600  Weight: 133 lb 2.5 oz (60.4 kg) 129 lb 6.6 oz (58.7 kg) 123 lb 14.4 oz (56.2 kg)  -Transfuse for hemoglobin<8 -3/6 now comfort care   Newly diagnosed A. fib with RVR -Irregular irregular rhythm and rate -3/6 now comfort care  SIRS  -On admission meets criteria for SIRS , however no clinical indication of infection. -most likely secondary to stress of acute NSTEMI. Antibiotics held  -3/6 now comfort care   Hypothyroidism -3/6 now comfort care.   Advanced dementia/AMS -Unable to evaluate secondary to altered mental status    Anemia of chronic disease  -Most likely anemia of chronic disease -Anemia panel: Consistent with anemia of chronic disease. -3/6 now comfort care   Thrombocytopenia -most likely secondary to stress related to NSTEMI  -See anemia -no signs of acute bleed. -3/6 now comfort care  Hypokalemia  -3/6 now comfort care  Hypernatremia -3/6 now comfort care     Goals of care  -Spoke w/ son at bedside - plan is to continue conservative medical tx of his MI balanced w/ pain and anxiety meds to assure he is comfortable - if declines further will discuss transition to full comfort care only  -If no improvement in next 48 hours would consult palliative  care. -3/6 now comfort care   DVT prophylaxis: Heparin drip Code Status: DO NOT RESUSCITATE Family Communication: Family at bedside for discussion of plan of care Disposition Plan: TBD   Consultants:  Cardiology   Procedures/Significant Events:  2/28 Echocardiogram; Left ventricle: LVEF= 40% to 45%.-  Akinesis and scarring of the basal-midinferolateral,  inferior,  and inferoseptal myocardium; consistent with infarction in the   distribution of the right coronary artery. -Grade 2 diastolic dysfunction. - Aortic valve:moderate regurgitation  - Mitral valve: mild to moderate regurgitation     I have personally reviewed and interpreted all radiology studies and my findings are as above.  VENTILATOR SETTINGS:    Cultures   Antimicrobials: Anti-infectives (From admission, onward)   Start     Stop   03/09/17 2200  vancomycin (VANCOCIN) IVPB 1000 mg/200 mL premix  Status:  Discontinued     03/09/17 1207   03/09/17 0600  piperacillin-tazobactam (ZOSYN) IVPB 3.375 g  Status:  Discontinued     03/09/17 1207   03/09/17 0000  piperacillin-tazobactam (ZOSYN) IVPB 3.375 g     03/09/17 0210   03/09/17 0000  vancomycin (VANCOCIN) IVPB 1000 mg/200 mL premix     03/09/17 0205       Devices    LINES / TUBES:      Continuous Infusions: . morphine 1 mg/hr (03/16/17 1926)     Objective: Vitals:   03-18-17 0314 2017/03/18 0600 18-Mar-2017 0800 March 18, 2017 1123  BP:  102/60 100/65 97/60  Pulse: (!) 49  (!) 102 (!) 55  Resp: 20 19    Temp:   97.7 F (36.5 C) 97.6 F (36.4 C)  TempSrc:   Axillary Axillary  SpO2: 90%   (!) 85%  Weight:  123 lb 14.4 oz (56.2 kg)    Height:        Intake/Output Summary (Last 24 hours) at March 18, 2017 1938 Last data filed at 03-18-2017 1758 Gross per 24 hour  Intake 7.57 ml  Output 275 ml  Net -267.43 ml   Filed Weights   03/14/17 0352 03/15/17 0344 Mar 18, 2017 0600  Weight: 133 lb 2.5 oz (60.4 kg) 129 lb 6.6 oz (58.7 kg) 123 lb 14.4 oz (56.2 kg)    Physical Exam:  General: A/O 0, No acute respiratory distress Neck:  Negative scars, masses, torticollis, lymphadenopathy, JVD Lungs: Clear to auscultation bilaterally without wheezes or crackles Cardiovascular: Irregular irregular rhythm and rate, without murmur gallop or rub normal S1 and S2 Abdomen: negative abdominal pain, nondistended, positive soft,  bowel sounds, no rebound, no ascites, no appreciable mass Extremities: No significant cyanosis, clubbing, or edema bilateral lower extremities Skin: Negative rashes, lesions, ulcers Psychiatric:  Unable to evaluate secondary somnolence Central nervous system:  Unable to evaluate secondary somnolence   .     Data Reviewed: Care during the described time interval was provided by me .  I have reviewed this patient's available data, including medical history, events of note, physical examination, and all test results as part of my evaluation.   CBC: Recent Labs  Lab 03/11/17 0431 03/12/17 0300 03/15/17 0419  WBC 8.9 8.4 11.1*  HGB 11.0* 10.9* 11.4*  HCT 33.5* 33.4* 35.5*  MCV 95.7 94.4 97.8  PLT 109* 113* 979*   Basic Metabolic Panel: Recent Labs  Lab 03/11/17 0431 03/12/17 0300 03/15/17 0419 03/16/17 1040  NA 143 142 152* 155*  K 4.1 3.7 3.2* 3.7  CL 106 106 115* 120*  CO2 26 25 26 25   GLUCOSE 142* 149* 137* 148*  BUN 24* 21* 31* 31*  CREATININE 1.14 1.04 1.25* 1.32*  CALCIUM 9.0 8.7* 8.9 8.4*  MG 1.7 1.8 2.2  --    GFR: Estimated Creatinine Clearance: 30.7 mL/min (A) (by C-G formula based on SCr of 1.32 mg/dL (H)). Liver Function Tests: No results for input(s): AST, ALT, ALKPHOS, BILITOT, PROT, ALBUMIN in the last 168 hours. No results for input(s): LIPASE, AMYLASE in the last 168 hours. No results for input(s): AMMONIA in the last 168 hours. Coagulation Profile: No results for input(s): INR, PROTIME in the last 168 hours. Cardiac Enzymes: Recent Labs  Lab 03/11/17 0431 03/12/17 0300  TROPONINI 34.86* 21.86*   BNP (last 3 results) No results for input(s): PROBNP in the last 8760 hours. HbA1C: No results for input(s): HGBA1C in the last 72 hours. CBG: No results for input(s): GLUCAP in the last 168 hours. Lipid Profile: No results for input(s): CHOL, HDL, LDLCALC, TRIG, CHOLHDL, LDLDIRECT in the last 72 hours. Thyroid Function Tests: No results for  input(s): TSH, T4TOTAL, FREET4, T3FREE, THYROIDAB in the last 72 hours. Anemia Panel: No results for input(s): VITAMINB12, FOLATE, FERRITIN, TIBC, IRON, RETICCTPCT in the last 72 hours. Urine analysis:    Component Value Date/Time   COLORURINE YELLOW 10/04/2016 [place] 10/04/2016 0952   LABSPEC 1.012 10/04/2016 0952   PHURINE 7.0 10/04/2016 0952   GLUCOSEU NEGATIVE 10/04/2016 0952   HGBUR NEGATIVE 10/04/2016 0952   KETONESUR NEGATIVE 10/04/2016 0952   PROTEINUR NEGATIVE 10/04/2016 0952   NITRITE NEGATIVE 10/04/2016 0952   LEUKOCYTESUR NEGATIVE 10/04/2016 0952   Sepsis Labs: @LABRCNTIP (procalcitonin:4,lacticidven:4)  ) Recent Results (from the past 240 hour(s))  Culture, blood (routine x 2)     Status: Abnormal   Collection Time: 02/20/2017 10:18 PM  Result Value Ref Range Status   Specimen Description BLOOD LEFT ANTECUBITAL  Final   Special Requests   Final    BOTTLES DRAWN AEROBIC AND ANAEROBIC Blood Culture adequate volume   Culture  Setup Time   Final    GRAM POSITIVE COCCI AEROBIC BOTTLE ONLY CRITICAL RESULT CALLED TO, READ BACK BY AND VERIFIED WITH: J LEDFORD PHARMD 03/10/17 0008 JDW    Culture (A)  Final    STAPHYLOCOCCUS SPECIES (COAGULASE NEGATIVE) THE SIGNIFICANCE OF ISOLATING THIS ORGANISM FROM A SINGLE SET OF BLOOD CULTURES WHEN MULTIPLE SETS ARE DRAWN IS UNCERTAIN. PLEASE NOTIFY THE MICROBIOLOGY DEPARTMENT WITHIN ONE WEEK IF SPECIATION AND SENSITIVITIES ARE REQUIRED. Performed at [facility], [place] [address], [place], [place] [address]    Report Status 03/11/2017 FINAL  Final  Blood Culture ID Panel (Reflexed)     Status: Abnormal   Collection Time: 02/21/2017 10:18 PM  Result Value Ref Range Status   Enterococcus species NOT DETECTED NOT DETECTED Final   Listeria monocytogenes NOT DETECTED NOT DETECTED Final   Staphylococcus species DETECTED (A) NOT DETECTED Final    Comment: Methicillin (oxacillin) susceptible coagulase negative  staphylococcus. Possible blood culture contaminant (unless isolated from more than one blood culture draw or clinical case suggests pathogenicity). No antibiotic treatment is indicated for blood  culture contaminants. CRITICAL RESULT CALLED TO, READ BACK BY AND VERIFIED WITH: J LEDFORD PHARMD 03/10/17 0008 JDW    Staphylococcus aureus NOT DETECTED NOT DETECTED Final   Methicillin resistance NOT DETECTED NOT DETECTED Final   Streptococcus species NOT DETECTED NOT DETECTED Final   Streptococcus agalactiae NOT DETECTED NOT DETECTED Final   Streptococcus pneumoniae NOT DETECTED NOT DETECTED Final   Streptococcus pyogenes NOT DETECTED NOT DETECTED  Final   Acinetobacter baumannii NOT DETECTED NOT DETECTED Final   Enterobacteriaceae species NOT DETECTED NOT DETECTED Final   Enterobacter cloacae complex NOT DETECTED NOT DETECTED Final   Escherichia coli NOT DETECTED NOT DETECTED Final   Klebsiella oxytoca NOT DETECTED NOT DETECTED Final   Klebsiella pneumoniae NOT DETECTED NOT DETECTED Final   Proteus species NOT DETECTED NOT DETECTED Final   Serratia marcescens NOT DETECTED NOT DETECTED Final   Haemophilus influenzae NOT DETECTED NOT DETECTED Final   Neisseria meningitidis NOT DETECTED NOT DETECTED Final   Pseudomonas aeruginosa NOT DETECTED NOT DETECTED Final   Candida albicans NOT DETECTED NOT DETECTED Final   Candida glabrata NOT DETECTED NOT DETECTED Final   Candida krusei NOT DETECTED NOT DETECTED Final   Candida parapsilosis NOT DETECTED NOT DETECTED Final   Candida tropicalis NOT DETECTED NOT DETECTED Final    Comment: Performed at [facility], [place] [address], [place], [place] [address]  Culture, blood (routine x 2)     Status: None   Collection Time: 03/01/2017 11:42 PM  Result Value Ref Range Status   Specimen Description BLOOD LEFT HAND  Final   Special Requests IN PEDIATRIC BOTTLE Blood Culture adequate volume  Final   Culture   Final    NO GROWTH 5 DAYS Performed at [facility], [place] [address] [place], [place] [address]    Report Status 03/14/2017 FINAL  Final  MRSA PCR Screening     Status: None   Collection Time: 03/09/17  3:45 PM  Result Value Ref Range Status   MRSA by PCR NEGATIVE NEGATIVE Final    Comment:        The GeneXpert MRSA Assay (FDA approved for NASAL specimens only), is one component of a comprehensive MRSA colonization surveillance program. It is not intended to diagnose MRSA infection nor to guide or monitor treatment for MRSA infections. Performed at [facility], [place] [address], [place], [place] [address]          Radiology Studies: No results found.      Scheduled Meds: . feeding supplement (ENSURE ENLIVE)  237 mL Oral BID BM  . mouth rinse  15 mL Mouth Rinse BID  . risperiDONE  0.5 mg Oral BID   Continuous Infusions: . morphine 1 mg/hr (03/16/17 1926)     LOS: 8 days    Time spent: 40 minutes    WOODS, Geraldo Docker, MD [facility] Pager [phone]   If 7PM-7AM, please contact night-coverage [email or web address] Password [email or web address] Mar 18, 2017, 7:38 PM

## 2017-04-11 NOTE — Progress Notes (Addendum)
Daily Progress Note   Patient Name: Raymond [name]       Date: 2017-04-16 DOB: 02-08-1926  Age: 84 y.o. MRN#: [id number] Attending Physician: Allie Bossier, MD Primary Care Physician: Raylene Everts, MD Admit Date: 03/04/2017  Reason for Consultation/Follow-up: Establishing goals of care and Terminal Care  Subjective: Patient is actively dying.  He is tachypneic. Bedside RN administered IV morphine bolus for air hunger. Patient is warm to touch with some signs of perspiration to his forehead. Cool towel applied to forehead via nurse. Mrs. Vella Raring is at the bedside and verbalized her acceptance and recognizing signs of transitioning (unresponsive, breathing pattern changes) . Discussed with family a change in patient's status over the past 24hrs. Explained signs of pain, discomfort, or air hunger and encouraged them if they observe any of the nonverbal signs (tremors, fast respirations, moaning, brow furring etc.) to contact the nurse for assistance. Mr. Beske is full comfort care and at this time is unstable for transport to either his home or residential hospice. We anticipate he will have a hospital death and family is in agreement. Nurse is aware and is doing a great job managing symptoms.   Support offered at this time. Family encouraged to contact our team if they have any questions or concerns. Palliative Care Team will continue to follow patient and support family and medical team.    Review of Systems  Unable to perform ROS: Acuity of condition    Length of Stay: 8  Current Medications: Scheduled Meds:  . feeding supplement (ENSURE ENLIVE)  237 mL Oral BID BM  . mouth rinse  15 mL Mouth Rinse BID  . risperiDONE  0.5 mg Oral BID    Continuous Infusions: . morphine 1 mg/hr  (03/16/17 1926)    PRN Meds: acetaminophen, glycopyrrolate **OR** glycopyrrolate **OR** glycopyrrolate, haloperidol **OR** haloperidol **OR** haloperidol lactate, LORazepam, morphine, ondansetron (ZOFRAN) IV  Physical Exam  Constitutional:  cachetic  Cardiovascular: Intact distal pulses.  Tachycardic, irregular   Pulmonary/Chest:  Diminished, periods of apnea  Neurological:  nonresponsive  Skin: Skin is warm and dry.  Nursing note and vitals reviewed.           Vital Signs: BP 100/65 (BP Location: Left Arm)   Pulse (!) 102   Temp 97.7 F (36.5 C) (Axillary)   Resp  19   Ht 5\' 8"  (1.727 m)   Wt 56.2 kg (123 lb 14.4 oz)   SpO2 90%   BMI 18.84 kg/m  SpO2: SpO2: 90 % O2 Device: O2 Device: Nasal Cannula O2 Flow Rate: O2 Flow Rate (L/min): 4 L/min  Intake/output summary:   Intake/Output Summary (Last 24 hours) at 04/03/17 1033 Last data filed at April 03, 2017 0900 Gross per 24 hour  Intake 7.57 ml  Output 275 ml  Net -267.43 ml   LBM: Last BM Date: 03/14/17 Baseline Weight: Weight: 60.3 kg (133 lb) Most recent weight: Weight: 56.2 kg (123 lb 14.4 oz)       Palliative Assessment/Data: PPS: 10%   Flowsheet Rows     Most Recent Value  Intake Tab  Referral Department  Hospitalist  Unit at Time of Referral  Med/Surg Unit  Palliative Care Primary Diagnosis  Neurology  Date Notified  03/15/17  Palliative Care Type  New Palliative care  Reason for referral  Clarify Goals of Care, Non-pain Symptom, Pain  Date of Admission  02/17/2017  Date first seen by Palliative Care  03/15/17  # of days Palliative referral response time  0 Day(s)  # of days IP prior to Palliative referral  7  Clinical Assessment  Psychosocial & Spiritual Assessment  Palliative Care Outcomes      Patient Active Problem List   Diagnosis Date Noted  . Advanced care planning/counseling discussion   . Goals of care, counseling/discussion   . Palliative care by specialist   . Malnutrition of moderate  degree 03/12/2017  . ASCVD (arteriosclerotic cardiovascular disease) 03/09/2017  . Unspecified atrial fibrillation ([place]) 03/09/2017  . Sepsis, unspecified organism ([place]) 03/09/2017  . Pressure injury of skin 03/09/2017  . Acute ST elevation myocardial infarction (STEMI) of posterior wall (HCC)   . Atrial fibrillation with RVR ([place])   . CKD (chronic kidney disease) stage 2, GFR 60-89 ml/min 06/18/2016  . Gait instability 06/14/2016  . Aortic valve calcification 04/07/2016  . History of myocardial infarction 04/07/2016  . GERD (gastroesophageal reflux disease) 10/04/2014  . Dementia 04/16/2013  . Hyperlipidemia 04/16/2013  . Esophageal dysphagia 06/23/2012  . Essential hypertension 04/08/2012  . Acute MI ([place])   . Hypothyroidism 10/15/2009  . Psoriasis 10/15/2009  . PAD (peripheral artery disease) ([place]) 09/11/2009  . Benign esophageal stricture 09/11/2009  . DIVERTICULAR DISEASE 09/11/2009    Palliative Care Assessment & Plan   Patient Profile: 84 y.o. male  admitted on 02/12/2017 with s/p fall and chest pain. He has a past medical history of dementia with behavioral disturbance, coronary artery disease, hypothyroidism, chronic anemia and thrombocytopenia, and anxiety. Patient had reportedly been in his usual state of health and was having an uneventful day when he was noted by his caregiver to fall to the ground. He complained of chest pain and was noted to be diaphoretic and pale. EMS was called and patient was noted to be in atrial fibrillation with RVR, with incomplete LBBB on the monitor with marked ST depression in V1 through V3 concerning for posterior STEMI (also shown on EKG). Chest x-ray is notable for diffuse reticular opacity consistent with fibrosis, and patchy atelectasis or small infiltrates in the bases. Noncontrast head CT is negative for acute intracranial abnormality. During this admission his RVR has continued to be difficult to control. He was deemed not to be a  surgical candidate for STEMI. Palliative medicine consulted for "what is most appropriate disposition?-SNF v/s Hospice House".  Assessment/Recommendations/Plan   Patient actively dying  Not stable for transfer  Continue comfort measures as ordered   Goals of Care and Additional Recommendations:  Limitations on Scope of Treatment: Full Comfort Care  Code Status:  DNR  Prognosis:   Hours - Days  Discharge Planning:  Anticipated Hospital Death  Care plan was discussed with patient's Gerty, and patient's RN- Melynn.  Thank you for allowing the Palliative Medicine Team to assist in the care of this patient.   Time In: 0755 Time Out: 0830 Total Time 35 mins Prolonged Time Billed no      Greater than 50%  of this time was spent counseling and coordinating care related to the above assessment and plan.  Alda Lea, AGNP-C Palliative Medicine  Mariana Kaufman, AGNP-C Palliative Medicine   Please contact Palliative Medicine Team phone at [phone] for questions and concerns.

## 2017-04-11 NOTE — Progress Notes (Signed)
Pt expired, auscultated, no heart beat or breath sounds noted. Confirmed by two RNs(Christy [place]). Family present at bedside. Notified Bodenheimer, NP. Remainder of morphine wasted in sink with above nurses. Notified [place] [facility],

## 2017-04-11 NOTE — Death Summary Note (Signed)
Death Summary  Raymond [name] ESP:[id number] DOB: [birth year] DOA: 02-Apr-2017  PCP: Raymond Everts, MD PCP/Office notified:no  Admit date: 04/02/17 Date of Death: 04/11/17  Final Diagnoses:  Principal Problem:   Acute MI [facility]) Active Problems:   Hypothyroidism   Essential hypertension   Dementia   CKD (chronic kidney disease) stage 2, GFR 60-89 ml/min   ASCVD (arteriosclerotic cardiovascular disease)   Unspecified atrial fibrillation (HCC)   Sepsis, unspecified organism ([place])   Pressure injury of skin   Malnutrition of moderate degree   Advanced care planning/counseling discussion   Goals of care, counseling/discussion   Palliative care by specialist   Terminal care   Acute STEMI/ Acute Systolic and Diastolic CHF  -Family has chosen conservative management. Per EMR informed cardiology does not want intervention. -Per EMR unlikely patient to survive hospitalization. Medical care as per wishes of his son at the bedside (default POA - no other children - wife deceased) -Echocardiogram: Consistent with systolic and diastolic CHF see results below.no previous echocardiogram for comparison. -Strict in and out since admission -3.1 L -Daily weight      Filed Weights    03/14/17 0352 03/15/17 0344 04-11-2017 0600  Weight: 133 lb 2.5 oz (60.4 kg) 129 lb 6.6 oz (58.7 kg) 123 lb 14.4 oz (56.2 kg)  -Transfuse for hemoglobin<8 -3/6 now comfort care   Newly diagnosed A. fib with RVR -Irregular irregular rhythm and rate -3/6 now comfort care   SIRS  -On admission meets criteria for SIRS , however no clinical indication of infection. -most likely secondary to stress of acute NSTEMI. Antibiotics held  -3/6 now comfort care   Hypothyroidism -3/6 now comfort care.   Advanced dementia/AMS -Unable to evaluate secondary to altered mental status    Anemia of chronic disease  -Most likely anemia of chronic disease -Anemia panel: Consistent with anemia of chronic disease. -3/6 now  comfort care   Thrombocytopenia -most likely secondary to stress related to NSTEMI  -See anemia -no signs of acute bleed. -3/6 now comfort care   Hypokalemia  -3/6 now comfort care   Hypernatremia -3/6 now comfort care  History of present illness:    Hospital Course:  84 y.o. WM PMHx   severe dementia with behavioral disturbance, CAD, hypothyroidism, chronic anemia and thrombocytopenia, and anxiety    Presented to the ED w/ chest pain after a fall to the ground.  He was noted to be diaphoretic and pale.  EMS was called and patient was noted to be in atrial fibrillation with RVR with marked ST depression in V1 through V3 concerning for posterior STEMI.   Upon arrival to the ED the patient was found to be hypothermic with temperature 35.4 C, tachycardic in the 120s, with blood pressure of 72/58.  EKG features atrial fibrillation with incomplete LBBB and marked ST depression in V1 through V3.  Chest x-ray is notable for diffuse reticular opacity consistent with fibrosis, and patchy atelectasis or small infiltrates in the bases.  CT head was negative for acute intracranial abnormality.  Lactic acid was elevated to 3.61, and troponin was undetectable.  Cardiology was consulted and the pt was treated with rectal aspirin and a heparin infusion.  The family declined coronary intervention. On 3/6 the family decided on making comfort care.Per RN Raymond [name] note patient expired peacefully on 2022-04-12 at 1956    Time: 1956  Signed:  Dia Crawford, MD [facility] [phone]

## 2017-04-11 DEATH — deceased

## 2018-04-13 IMAGING — CT CT HEAD W/O CM
4 series · 14 of 47 positions shown, 16 images · non-contrast
Comparison: CT 09/21/2015

CLINICAL DATA: Fell at home

EXAM:
CT HEAD WITHOUT CONTRAST
TECHNIQUE: Contiguous axial images were obtained from the base of the skull
through the vertex without intravenous contrast.

[Series 4: head without ax · axial · non-contrast · 0.33mm/px · z∈[-37,+90]mm · 7 of 36 slices shown, 9 images]
[im 5/36  brain]
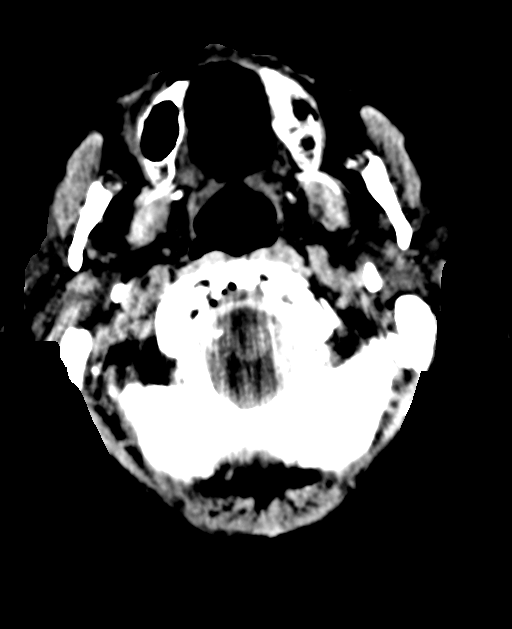
[im 5/36  bone]
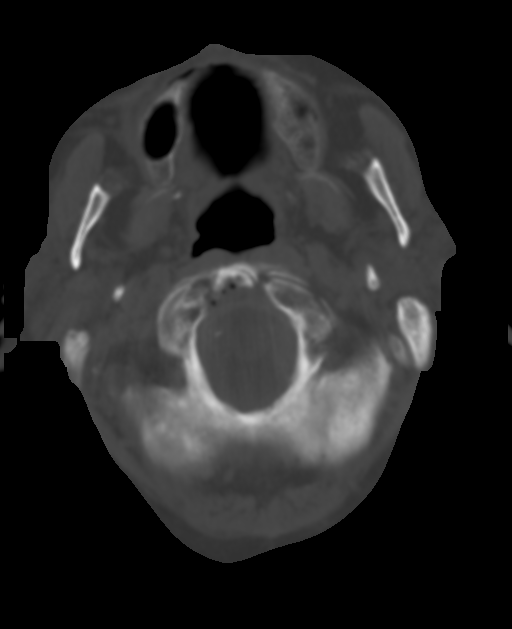
[im 9/36  brain]
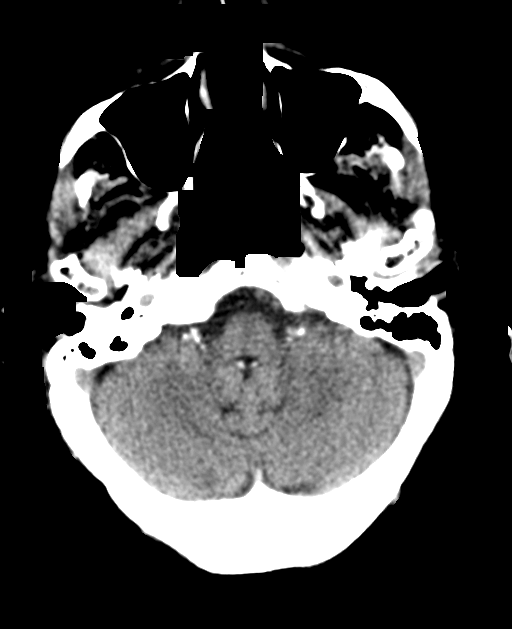
[im 14/36  brain]
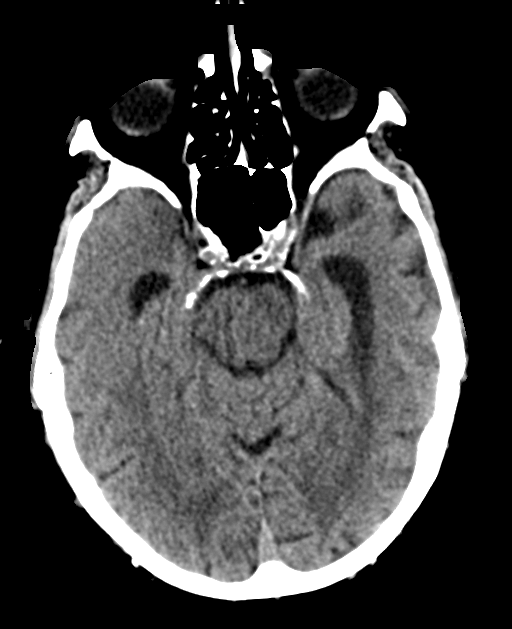
[im 18/36  brain]
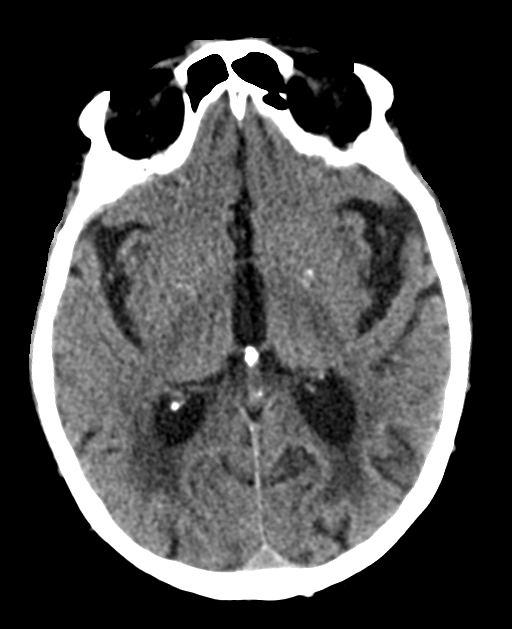
[im 22/36  brain]
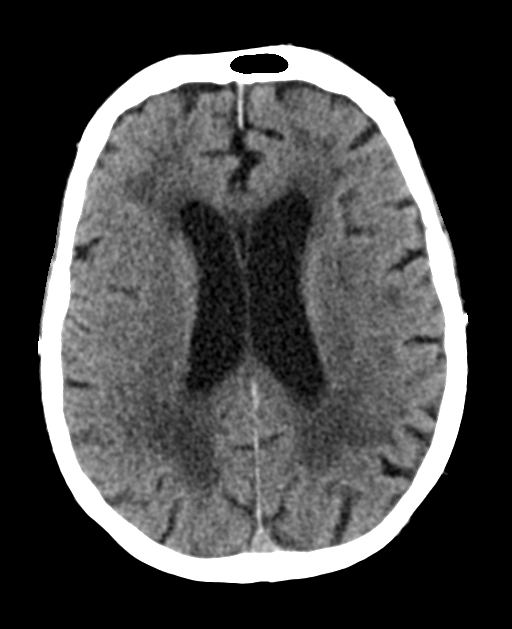
[im 22/36  bone]
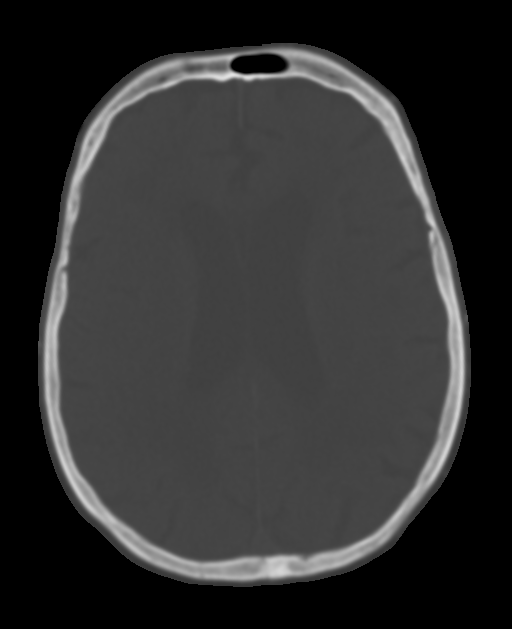
[im 27/36  brain]
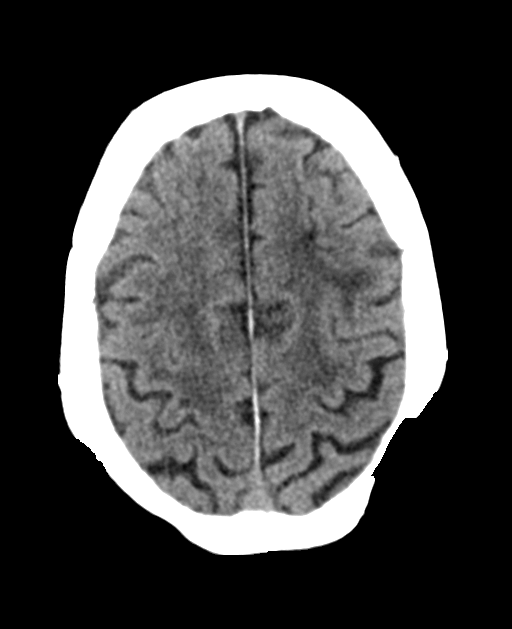
[im 31/36  brain]
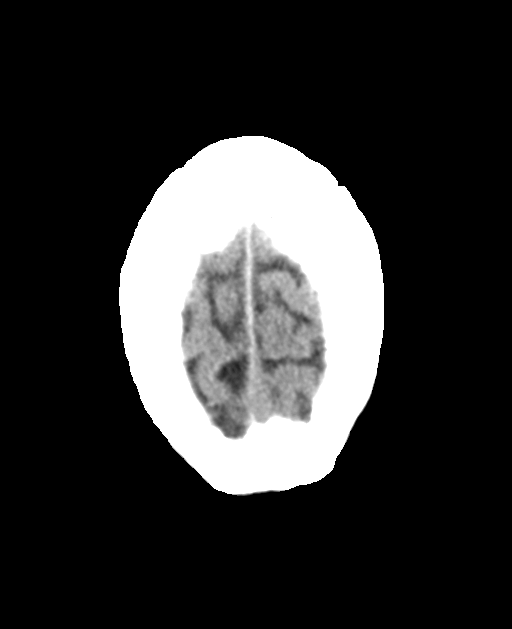

[Series 5: head without cor · coronal · non-contrast · 0.32mm/px · 3 of 67 slices shown]
[im 23/67  brain]
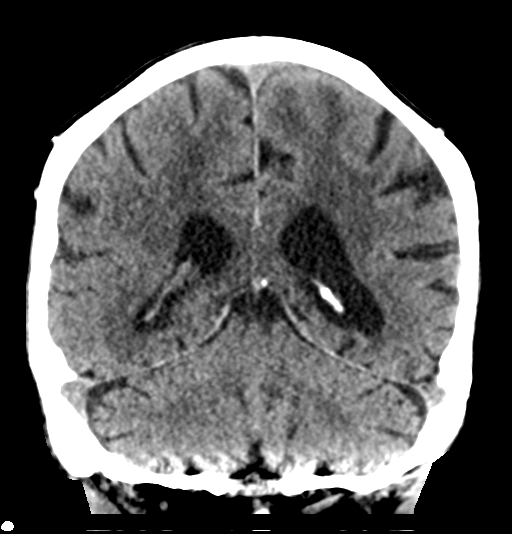
[im 30/67  brain]
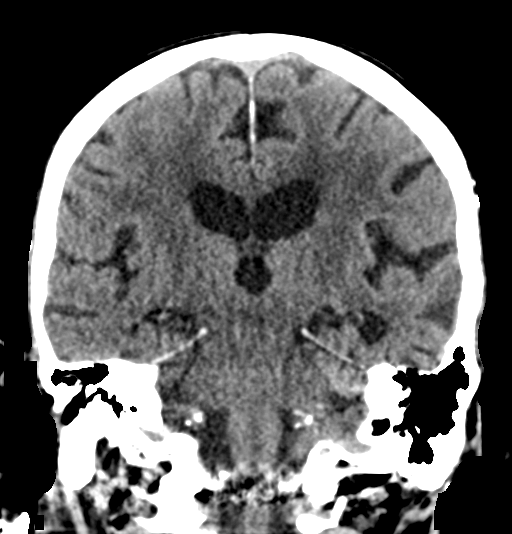
[im 37/67  brain]
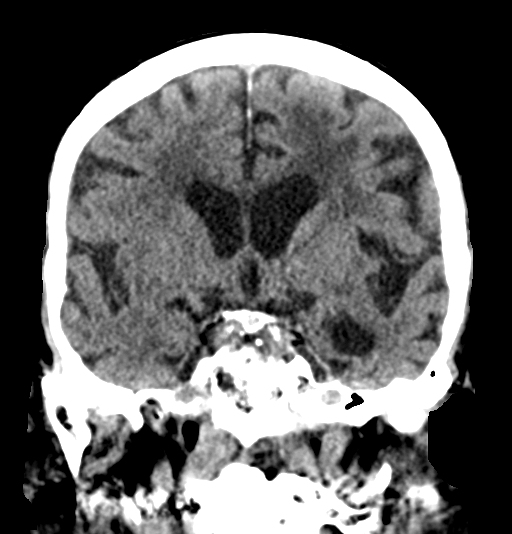

[Series 6: head without sag · sagittal · non-contrast · 0.34mm/px · 3 of 54 slices shown]
[im 18/54  brain]
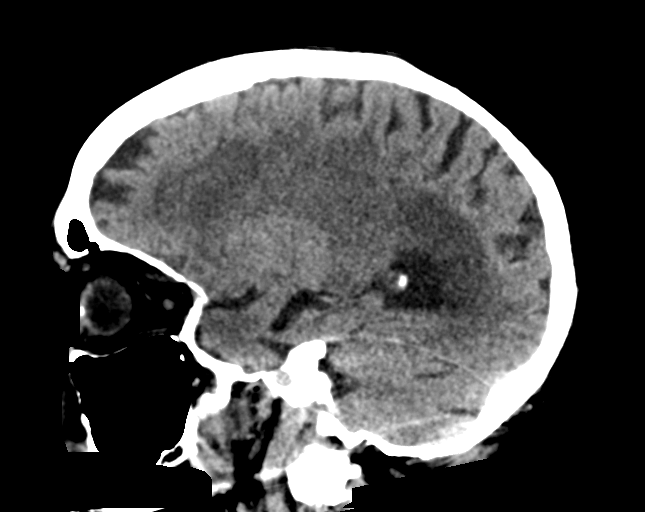
[im 27/54  brain]
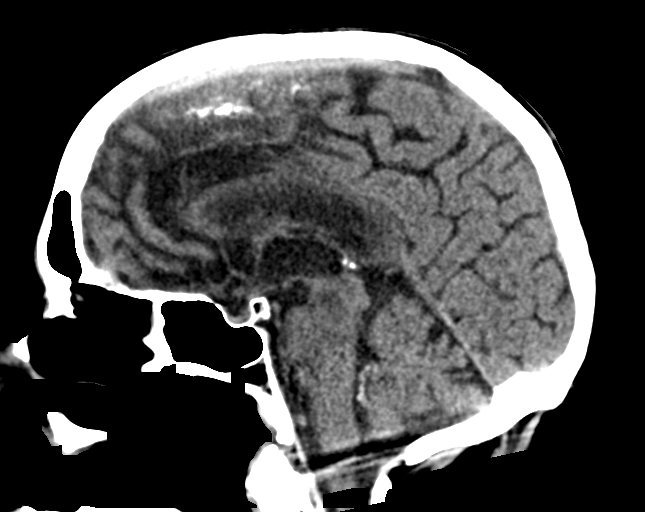
[im 36/54  brain]
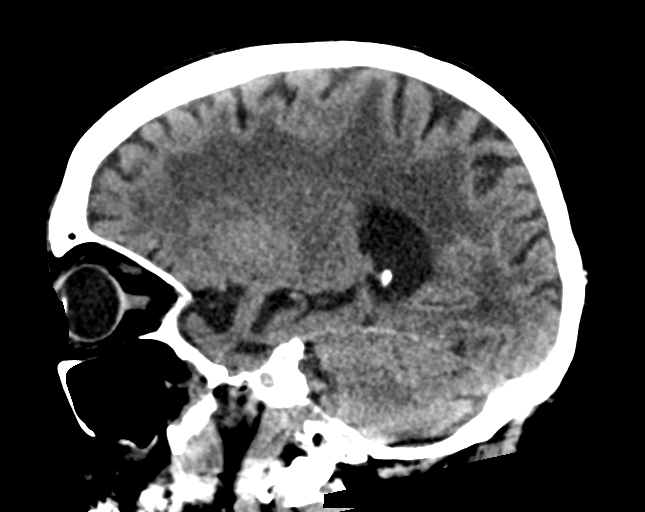

[Series 9: ax head bone · axial · 0.34mm/px · 1 of 84 slices shown]
[im 9/84  bone]
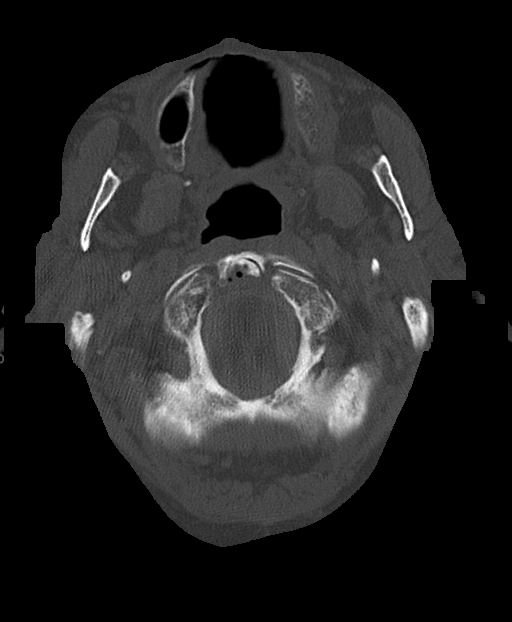

[14 of 47 positions shown; findings below may reference images not displayed]

FINDINGS: Brain: No acute territorial infarction, hemorrhage or intracranial
mass is visualized. Moderate severe small vessel ischemic changes of
the white matter. Moderate atrophy. Stable ventricle size

Vascular: No hyperdense vessels.  Carotid vascular calcification

Skull: No depressed skull fracture. Thinning of the left parietal
bone

Sinuses/Orbits: Mild mucosal thickening in the ethmoid sinuses. No
acute orbital abnormality

Other: None
IMPRESSION: 1. No CT evidence for acute intracranial abnormality.
2. Atrophy and small vessel ischemic changes of the white matter
